# Patient Record
Sex: Female | Born: 2000 | Race: White | Hispanic: No | Marital: Single | Smoking: Never smoker
Health system: Southern US, Community
[De-identification: ages and names within clinical notes are randomized; demographics above are authoritative.]

## PROBLEM LIST (undated history)

## (undated) DIAGNOSIS — F39 Unspecified mood [affective] disorder: Secondary | ICD-10-CM

## (undated) DIAGNOSIS — R011 Cardiac murmur, unspecified: Secondary | ICD-10-CM

## (undated) DIAGNOSIS — L309 Dermatitis, unspecified: Secondary | ICD-10-CM

## (undated) DIAGNOSIS — F840 Autistic disorder: Secondary | ICD-10-CM

## (undated) DIAGNOSIS — F32A Depression, unspecified: Secondary | ICD-10-CM

## (undated) DIAGNOSIS — Z872 Personal history of diseases of the skin and subcutaneous tissue: Secondary | ICD-10-CM

## (undated) DIAGNOSIS — F913 Oppositional defiant disorder: Secondary | ICD-10-CM

## (undated) DIAGNOSIS — J45909 Unspecified asthma, uncomplicated: Secondary | ICD-10-CM

## (undated) DIAGNOSIS — F329 Major depressive disorder, single episode, unspecified: Secondary | ICD-10-CM

## (undated) DIAGNOSIS — F909 Attention-deficit hyperactivity disorder, unspecified type: Secondary | ICD-10-CM

## (undated) HISTORY — PX: TYMPANOSTOMY TUBE PLACEMENT: SHX32

## (undated) HISTORY — PX: ADENOIDECTOMY: SUR15

---
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
## 2018-04-05 ENCOUNTER — Encounter (HOSPITAL_COMMUNITY): Payer: Self-pay

## 2018-04-05 ENCOUNTER — Other Ambulatory Visit: Payer: Self-pay

## 2018-04-05 ENCOUNTER — Emergency Department (HOSPITAL_COMMUNITY)
Admission: EM | Admit: 2018-04-05 | Discharge: 2018-04-06 | Disposition: A | Discharge status: Home / Self Care | Payer: Medicaid Other | Attending: Emergency Medicine | Admitting: Emergency Medicine

## 2018-04-05 DIAGNOSIS — Z79899 Other long term (current) drug therapy: Secondary | ICD-10-CM | POA: Insufficient documentation

## 2018-04-05 DIAGNOSIS — R1031 Right lower quadrant pain: Secondary | ICD-10-CM | POA: Diagnosis present

## 2018-04-05 DIAGNOSIS — R110 Nausea: Secondary | ICD-10-CM | POA: Diagnosis not present

## 2018-04-05 DIAGNOSIS — R1084 Generalized abdominal pain: Secondary | ICD-10-CM

## 2018-04-05 DIAGNOSIS — F840 Autistic disorder: Secondary | ICD-10-CM | POA: Diagnosis not present

## 2018-04-05 DIAGNOSIS — J45909 Unspecified asthma, uncomplicated: Secondary | ICD-10-CM | POA: Diagnosis not present

## 2018-04-05 DIAGNOSIS — R197 Diarrhea, unspecified: Secondary | ICD-10-CM | POA: Diagnosis not present

## 2018-04-05 LAB — CBC
HEMATOCRIT: 37.6 % (ref 36.0–46.0)
Hemoglobin: 12.3 g/dL (ref 12.0–15.0)
MCH: 29.4 pg (ref 26.0–34.0)
MCHC: 32.7 g/dL (ref 30.0–36.0)
MCV: 89.7 fL (ref 78.0–100.0)
Platelets: 341 10*3/uL (ref 150–400)
RBC: 4.19 MIL/uL (ref 3.87–5.11)
RDW: 12.8 % (ref 11.5–15.5)
WBC: 13.6 10*3/uL — AB (ref 4.0–10.5)

## 2018-04-05 LAB — URINALYSIS, ROUTINE W REFLEX MICROSCOPIC
BILIRUBIN URINE: NEGATIVE
GLUCOSE, UA: NEGATIVE mg/dL
KETONES UR: NEGATIVE mg/dL
NITRITE: NEGATIVE
PH: 6 (ref 5.0–8.0)
Protein, ur: NEGATIVE mg/dL
Specific Gravity, Urine: 1.005 (ref 1.005–1.030)

## 2018-04-05 LAB — I-STAT CG4 LACTIC ACID, ED: Lactic Acid, Venous: 2.1 mmol/L (ref 0.5–1.9)

## 2018-04-05 LAB — I-STAT BETA HCG BLOOD, ED (MC, WL, AP ONLY): I-stat hCG, quantitative: <5 m[IU]/mL (ref <5)

## 2018-04-05 NOTE — ED Triage Notes (Signed)
Pt endorses abd pain x 3 days with radiation to the back, also complains of migraine. Denies n/v/d.

## 2018-04-06 ENCOUNTER — Emergency Department (HOSPITAL_COMMUNITY): Payer: Medicaid Other

## 2018-04-06 LAB — COMPREHENSIVE METABOLIC PANEL
ALT: 16 U/L (ref 0–44)
AST: 20 U/L (ref 15–41)
Albumin: 3.8 g/dL (ref 3.5–5.0)
Alkaline Phosphatase: 88 U/L (ref 38–126)
Anion gap: 6 (ref 5–15)
BILIRUBIN TOTAL: 0.3 mg/dL (ref 0.3–1.2)
BUN: 7 mg/dL (ref 6–20)
CALCIUM: 9.6 mg/dL (ref 8.9–10.3)
CO2: 23 mmol/L (ref 22–32)
Chloride: 110 mmol/L (ref 98–111)
Creatinine, Ser: 0.79 mg/dL (ref 0.44–1.00)
GFR calc Af Amer: >60 mL/min (ref >60)
Glucose, Bld: 123 mg/dL — ABNORMAL HIGH (ref 70–99)
POTASSIUM: 4.1 mmol/L (ref 3.5–5.1)
Sodium: 139 mmol/L (ref 135–145)
TOTAL PROTEIN: 6.7 g/dL (ref 6.5–8.1)

## 2018-04-06 LAB — GC/CHLAMYDIA PROBE AMP (~~LOC~~) NOT AT ARMC
Chlamydia: NEGATIVE
Neisseria Gonorrhea: NEGATIVE

## 2018-04-06 LAB — I-STAT CG4 LACTIC ACID, ED: Lactic Acid, Venous: 1.02 mmol/L (ref 0.5–1.9)

## 2018-04-06 LAB — WET PREP, GENITAL
SPERM: NONE SEEN
TRICH WET PREP: NONE SEEN
YEAST WET PREP: NONE SEEN

## 2018-04-06 LAB — LIPASE, BLOOD: Lipase: 46 U/L (ref 11–51)

## 2018-04-06 LAB — CBG MONITORING, ED: Glucose-Capillary: 126 mg/dL — ABNORMAL HIGH (ref 70–99)

## 2018-04-06 MED ORDER — IOHEXOL 300 MG/ML  SOLN
100.0000 mL | Freq: Once | INTRAMUSCULAR | Status: AC | PRN
  Administered 2018-04-06: 100 mL via INTRAVENOUS

## 2018-04-06 MED ORDER — DICYCLOMINE HCL 20 MG PO TABS: 20.0000 mg | ORAL_TABLET | Freq: Two times a day (BID) | ORAL | 0 refills | Status: DC | PRN

## 2018-04-06 MED ORDER — DICYCLOMINE HCL 10 MG/ML IM SOLN
20.0000 mg | Freq: Once | INTRAMUSCULAR | Status: AC
  Administered 2018-04-06: 20 mg via INTRAMUSCULAR
  Filled 2018-04-06: qty 2

## 2018-04-06 MED ORDER — SODIUM CHLORIDE 0.9 % IV BOLUS
1000.0000 mL | Freq: Once | INTRAVENOUS | Status: AC
  Administered 2018-04-06: 1000 mL via INTRAVENOUS

## 2018-04-06 NOTE — Discharge Instructions (Signed)
Your work-up in the emergency department today was reassuring.  We advised that you continue your daily prescribed medications.  You may take Bentyl as needed for abdominal pain or cramping.  Follow-up with your primary care doctor regarding your visit today.  You may return for new or concerning symptoms.

## 2018-04-06 NOTE — ED Provider Notes (Signed)
[facility] Provider Note   CSN: [id number] Arrival date & time: 04/05/18  2221     History   Chief Complaint Chief Complaint  Patient presents with  . Abdominal Pain    HPI Amanda [name] is a 17 y.o. female.   17 year old female presents to the emergency department for evaluation of abdominal pain.  She reports 3 days of abdominal pain which has been primarily below her umbilicus.  It will intermittently present in her right upper quadrant as well.  She believes her pain is slightly worse with eating.  During triage she reports radiation of her pain to the back, but denies any radiation during my assessment.  She has taken Tylenol for symptoms with little improvement.  Symptoms associated with mild nausea as well as slightly looser, more watery stool.  No fevers, vomiting, dysuria, hematuria, vaginal bleeding, vaginal discharge.  No history of abdominal surgeries.     Past Medical History:  Diagnosis Date  . Asthma   . Attention deficit hyperactivity disorder   . Depression   . Eczema   . Mood disorder (HCC)   . Oppositional defiant disorder     Patient Active Problem List   Diagnosis Date Noted  . DMDD (disruptive mood dysregulation disorder) (HCC) 01/14/2014  . PTSD (post-traumatic stress disorder) 01/14/2014  . Deliberate self-cutting 01/14/2014  . Autism spectrum 01/14/2014  . ADHD (attention deficit hyperactivity disorder) 01/14/2014  . Aggression 01/14/2014  . Suicidal ideation 01/14/2014  . ODD (oppositional defiant disorder) 01/13/2014  . Oppositional defiant disorder 11/04/2013    Past Surgical History:  Procedure Laterality Date  . ADENOIDECTOMY    . TYMPANOSTOMY TUBE PLACEMENT       OB History   None      Home Medications    Prior to Admission medications   Medication Sig Start Date End Date Taking? Authorizing Provider  Cholecalciferol (VITAMIN D3) 50000 units CAPS Take 50,000 Units by mouth once a  week.    [provider]  ciprofloxacin (CIPRO) 500 MG tablet Take 500 mg by mouth 2 (two) times daily.    [provider]  citalopram (CELEXA) 20 MG tablet Take 1 tablet (20 mg total) by mouth daily. Patient taking differently: Take 30 mg by mouth daily.  01/16/14   Winson, Louie BunKim B, NP  cloZAPine (CLOZARIL) 100 MG tablet Take 200 mg by mouth daily.    [provider]  clozapine (CLOZARIL) 50 MG tablet Take 50 mg by mouth 2 (two) times daily.    [provider]  desmopressin (DDAVP) 0.1 MG tablet Take 0.1 mg by mouth daily.    [provider]  dexmethylphenidate (FOCALIN XR) 10 MG 24 hr capsule Take 10 mg by mouth 2 (two) times daily.     [provider]  dicyclomine (BENTYL) 20 MG tablet Take 1 tablet (20 mg total) by mouth every 12 (twelve) hours as needed (abdominal pain/cramping). 04/06/18   Antony MaduraHumes, Carolann Brazell, PA-C  DiphenhydrAMINE HCl 50 MG/50ML LIQD Take by mouth.    [provider]  doxycycline (MONODOX) 100 MG capsule Take 1 capsule (100 mg total) by mouth 2 (two) times daily. For 7 days. 05/17/16   Warnell ForesterGrimes, Akilah, MD  hydrocortisone cream 1 % Apply 1 application topically 2 (two) times daily.    [provider]  hydrOXYzine (VISTARIL) 50 MG capsule Take 50 mg by mouth 3 (three) times daily as needed.    [provider]  ibuprofen (ADVIL,MOTRIN) 200 MG tablet Take  400 mg by mouth every 6 (six) hours as needed for mild pain. Reported on 12/05/2015 01/16/14   Jolene Schimke, NP  levothyroxine (SYNTHROID, LEVOTHROID) 50 MCG tablet Take 50 mcg by mouth daily before breakfast.    [provider]  lithium carbonate (ESKALITH) 450 MG CR tablet Take 1 tablet (450 mg total) by mouth 2 (two) times daily. Patient taking differently: Take 300 mg by mouth 3 (three) times daily.  01/16/14   Winson, Louie Bun, NP  loratadine (CLARITIN) 10 MG tablet Take 10 mg by mouth daily.    [provider]  montelukast (SINGULAIR) 10 MG  tablet Take 10 mg by mouth at bedtime.    [provider]  permethrin (ELIMITE) 5 % cream Apply 1 application topically once.    [provider]  traZODone (DESYREL) 100 MG tablet Take 100 mg by mouth at bedtime.    [provider]  triamcinolone cream (KENALOG) 0.5 % Apply 1 application topically 3 (three) times daily.    [provider]  valACYclovir (VALTREX) 500 MG tablet Take 500 mg by mouth daily.    [provider]    Family History History reviewed. No pertinent family history.  Social History Social History   Tobacco Use  . Smoking status: Never Smoker  . Smokeless tobacco: Never Used  Substance Use Topics  . Alcohol use: No  . Drug use: No     Allergies   Poison ivy extract [poison ivy extract] and Sulfa antibiotics   Review of Systems Review of Systems Ten systems reviewed and are negative for acute change, except as noted in the HPI.    Physical Exam Updated Vital Signs BP 111/66   Pulse 99   Temp 98.5 F (36.9 C) (Oral)   Resp 16   Ht 5\' 6"  (1.676 m)   Wt 99.8 kg (220 lb)   LMP 03/15/2018 (Exact Date)   SpO2 96%   BMI 35.51 kg/m   Physical Exam  Constitutional: She is oriented to person, place, and time. She appears well-developed and well-nourished. No distress.  Nontoxic appearing and in no distress  HENT:  Head: Normocephalic and atraumatic.  Eyes: Conjunctivae and EOM are normal. No scleral icterus.  Neck: Normal range of motion.  Cardiovascular: Normal rate, regular rhythm and intact distal pulses.  Not tachycardic as noted in triage  Pulmonary/Chest: Effort normal. No stridor. No respiratory distress. She has no wheezes.  Respirations even and unlabored.  Lungs clear.  Abdominal:  Soft, obese abdomen.  There is fairly generalized tenderness, subjectively worse in the right lower quadrant.  No peritoneal signs or involuntary guarding.  No palpable masses.  Musculoskeletal: Normal range of motion.    Neurological: She is alert and oriented to person, place, and time. She exhibits normal muscle tone. Coordination normal.  Skin: Skin is warm and dry. No rash noted. She is not diaphoretic. No erythema. No pallor.  Psychiatric: She has a normal mood and affect. Her behavior is normal.  Nursing note and vitals reviewed.    ED Treatments / Results  Labs (all labs ordered are listed, but only abnormal results are displayed) Labs Reviewed  WET PREP, GENITAL - Abnormal; Notable for the following components:      Result Value   Clue Cells Wet Prep HPF POC PRESENT (*)    WBC, Wet Prep HPF POC MANY (*)    All other components within normal limits  COMPREHENSIVE METABOLIC PANEL - Abnormal; Notable for the following components:  Glucose, Bld 123 (*)    All other components within normal limits  CBC - Abnormal; Notable for the following components:   WBC 13.6 (*)    All other components within normal limits  URINALYSIS, ROUTINE W REFLEX MICROSCOPIC - Abnormal; Notable for the following components:   Hgb urine dipstick SMALL (*)    Leukocytes, UA SMALL (*)    Bacteria, UA FEW (*)    All other components within normal limits  I-STAT CG4 LACTIC ACID, ED - Abnormal; Notable for the following components:   Lactic Acid, Venous 2.10 (*)    All other components within normal limits  CBG MONITORING, ED - Abnormal; Notable for the following components:   Glucose-Capillary 126 (*)    All other components within normal limits  LIPASE, BLOOD  I-STAT BETA HCG BLOOD, ED ([facility], [facility], [facility] ONLY)  I-STAT CG4 LACTIC ACID, ED  GC/CHLAMYDIA PROBE AMP ([place]) NOT AT [facility]    EKG None  Radiology Ct Abdomen Pelvis W Contrast  Result Date: 04/06/2018 CLINICAL DATA:  17 year old female with acute abdominal pain. EXAM: CT ABDOMEN AND PELVIS WITH CONTRAST TECHNIQUE: Multidetector CT imaging of the abdomen and pelvis was performed using the standard protocol following bolus administration of intravenous  contrast. CONTRAST:  OMNIPAQUE IOHEXOL 300 MG/ML  SOLN COMPARISON:  None. FINDINGS: Evaluation of this exam is limited due to respiratory motion artifact. Lower chest: The visualized lung bases are clear. No intra-abdominal free air or free fluid. Hepatobiliary: No focal liver abnormality is seen. No gallstones, gallbladder wall thickening, or biliary dilatation. Pancreas: Unremarkable. No pancreatic ductal dilatation or surrounding inflammatory changes. Spleen: Normal in size without focal abnormality. Adrenals/Urinary Tract: Adrenal glands are unremarkable. Kidneys are normal, without renal calculi, focal lesion, or hydronephrosis. Bladder is unremarkable. Stomach/Bowel: There is no bowel obstruction or active inflammation. High attenuating content within the appendix may represent oral contrast from prior CT or appendicoliths. The appendix otherwise appears unremarkable without inflammatory changes. Vascular/Lymphatic: No significant vascular findings are present. No enlarged abdominal or pelvic lymph nodes. Reproductive: The uterus and ovaries are grossly unremarkable. Other: None Musculoskeletal: No acute or significant osseous findings. IMPRESSION: No acute intra-abdominal or pelvic pathology. No bowel obstruction or active inflammation. High attenuating content within the appendix likely residual oral contrast from prior studies or appendicoliths. No CT evidence of acute appendicitis. Electronically Signed   By: Elgie Collard M.D.   On: 04/06/2018 02:39    Procedures Procedures (including critical care time)  Medications Ordered in ED Medications  sodium chloride 0.9 % bolus 1,000 mL (0 mLs Intravenous Stopped 04/06/18 0240)  dicyclomine (BENTYL) injection 20 mg (20 mg Intramuscular Given 04/06/18 0139)  iohexol (OMNIPAQUE) 300 MG/ML solution 100 mL (100 mLs Intravenous Contrast Given 04/06/18 0200)     Initial Impression / Assessment and Plan / ED Course  I have reviewed the triage vital  signs and the nursing notes.  Pertinent labs & imaging results that were available during my care of the patient were reviewed by me and considered in my medical decision making (see chart for details).     17 year old female presents to the emergency department for evaluation of abdominal pain.  Pain has been present for the past 3 days.  She has not had any vomiting or diarrhea.  Denies urinary symptoms as well as vaginal complaints.  Laboratory work-up today is reassuring.  Patient noted to have a mild, nonspecific leukocytosis.  No electrolyte derangements.  Liver and kidney function preserved.  CT scan  was obtained which shows no acute process in the abdomen or pelvis.  Question viral etiology versus constipation.  Will discharge with course of Bentyl for management of pain and cramping.  Encourage primary care follow-up to ensure resolution of symptoms.  Return precautions discussed and provided. Patient discharged in stable condition with no unaddressed concerns.   Final Clinical Impressions(s) / ED Diagnoses   Final diagnoses:  Generalized abdominal pain    ED Discharge Orders        Ordered    dicyclomine (BENTYL) 20 MG tablet  Every 12 hours PRN     04/06/18 0522       Antony Madura, PA-C 04/08/18 0506    Nira Conn, MD 04/10/18 1049

## 2018-04-06 NOTE — ED Notes (Signed)
Patient transported to X-ray 

## 2018-04-17 ENCOUNTER — Emergency Department (HOSPITAL_COMMUNITY): Payer: Medicaid Other

## 2018-04-17 ENCOUNTER — Emergency Department (HOSPITAL_COMMUNITY)
Admission: EM | Admit: 2018-04-17 | Discharge: 2018-04-17 | Disposition: A | Discharge status: Home / Self Care | Payer: Medicaid Other | Attending: Emergency Medicine | Admitting: Emergency Medicine

## 2018-04-17 ENCOUNTER — Encounter (HOSPITAL_COMMUNITY): Payer: Self-pay | Admitting: Emergency Medicine

## 2018-04-17 DIAGNOSIS — J45909 Unspecified asthma, uncomplicated: Secondary | ICD-10-CM | POA: Diagnosis not present

## 2018-04-17 DIAGNOSIS — Z79899 Other long term (current) drug therapy: Secondary | ICD-10-CM | POA: Insufficient documentation

## 2018-04-17 DIAGNOSIS — R0602 Shortness of breath: Secondary | ICD-10-CM

## 2018-04-17 LAB — D-DIMER, QUANTITATIVE: D-Dimer, Quant: 0.27 ug/mL-FEU (ref 0.00–0.50)

## 2018-04-17 LAB — I-STAT CHEM 8, ED
BUN: 10 mg/dL (ref 6–20)
CALCIUM ION: 1.25 mmol/L (ref 1.15–1.40)
CREATININE: 0.8 mg/dL (ref 0.44–1.00)
Chloride: 109 mmol/L (ref 98–111)
Glucose, Bld: 132 mg/dL — ABNORMAL HIGH (ref 70–99)
HCT: 35 % — ABNORMAL LOW (ref 36.0–46.0)
Hemoglobin: 11.9 g/dL — ABNORMAL LOW (ref 12.0–15.0)
Potassium: 4 mmol/L (ref 3.5–5.1)
Sodium: 140 mmol/L (ref 135–145)
TCO2: 20 mmol/L — ABNORMAL LOW (ref 22–32)

## 2018-04-17 LAB — I-STAT BETA HCG BLOOD, ED (MC, WL, AP ONLY): I-stat hCG, quantitative: <5 m[IU]/mL (ref <5)

## 2018-04-17 NOTE — ED Provider Notes (Signed)
Patient placed in Quick Look pathway, seen and evaluated   Chief Complaint: Anxiety, shortness of breath  HPI: Patient reports she went on a walk today and became short of breath, then discovered that her inhaler was empty and began to panic.  EMS was called because the patient was hyperventilating and started to feel dizzy, she did the breathing exercises her doctor is taught her and now reports she is starting to feel better.  No chest pain.  History of similar events in the past.  Currently resides in a group home and is getting ready to be released out on her own.  ROS: + Shortness of breath, anxiety, dizziness. -Chest pain, syncope  Physical Exam:   Gen: No distress, although appears anxious on exam   Neuro: Awake and Alert  Skin: Warm    Focused Exam: Lungs clear to auscultation bilaterally, heart regular rate and rhythm   Initiation of care has begun. The patient has been counseled on the process, plan, and necessity for staying for the completion/evaluation, and the remainder of the medical screening examination    Legrand RamsFord, Kelsey N, PA-C 04/17/18 2017    Azalia Bilisampos, Kevin, MD 04/17/18 [phone]

## 2018-04-17 NOTE — ED Provider Notes (Signed)
[facility] Provider Note   CSN: [id number] Arrival date & time: 04/17/18  1952     History   Chief Complaint Chief Complaint  Patient presents with  . Shortness of Breath  . Anxiety    HPI Amanda [name] is a 17 y.o. female who presents the emergency department chief complaint of shortness of breath.Past medical history of autism spectrum disorder, PTSD, oppositional defiant disorder, and mood regulation disorder.  Patient states that she was out walking today when she started feeling short of breath.  She reached in her approach to use her inhaler but noticed that it was empty.  She says that she became more and more short of breath and started feeling very dizzy like she was going to pass out.  She went to a [facility] and called an ambulance who brought her here to the emergency department.  HPI  Past Medical History:  Diagnosis Date  . Asthma   . Attention deficit hyperactivity disorder   . Depression   . Eczema   . Mood disorder (HCC)   . Oppositional defiant disorder     Patient Active Problem List   Diagnosis Date Noted  . DMDD (disruptive mood dysregulation disorder) (HCC) 01/14/2014  . PTSD (post-traumatic stress disorder) 01/14/2014  . Deliberate self-cutting 01/14/2014  . Autism spectrum 01/14/2014  . ADHD (attention deficit hyperactivity disorder) 01/14/2014  . Aggression 01/14/2014  . Suicidal ideation 01/14/2014  . ODD (oppositional defiant disorder) 01/13/2014  . Oppositional defiant disorder 11/04/2013    Past Surgical History:  Procedure Laterality Date  . ADENOIDECTOMY    . TYMPANOSTOMY TUBE PLACEMENT       OB History   None      Home Medications    Prior to Admission medications   Medication Sig Start Date End Date Taking? Authorizing Provider  citalopram (CELEXA) 20 MG tablet Take 1 tablet (20 mg total) by mouth daily. Patient taking differently: Take 30 mg by mouth daily.  01/16/14  Yes Winson,  Louie BunKim B, NP  cloZAPine (CLOZARIL) 100 MG tablet Take 200 mg by mouth daily.   Yes [provider]  clozapine (CLOZARIL) 50 MG tablet Take 50 mg by mouth 2 (two) times daily.   Yes [provider]  dicyclomine (BENTYL) 20 MG tablet Take 1 tablet (20 mg total) by mouth every 12 (twelve) hours as needed (abdominal pain/cramping). 04/06/18  Yes Antony MaduraHumes, Kelly, PA-C  levothyroxine (SYNTHROID, LEVOTHROID) 50 MCG tablet Take 50 mcg by mouth daily before breakfast.   Yes [provider]  lithium carbonate (ESKALITH) 450 MG CR tablet Take 1 tablet (450 mg total) by mouth 2 (two) times daily. Patient taking differently: Take 300 mg by mouth 3 (three) times daily.  01/16/14  Yes Winson, Louie BunKim B, NP  valACYclovir (VALTREX) 500 MG tablet Take 500 mg by mouth daily.   Yes [provider]  ADDERALL XR 20 MG 24 hr capsule Take 20 mg by mouth daily after lunch. 02/22/18   [provider]  Cholecalciferol (VITAMIN D3) 50000 units CAPS Take 50,000 Units by mouth once a week.    [provider]  ciprofloxacin (CIPRO) 500 MG tablet Take 500 mg by mouth 2 (two) times daily.    [provider]  desmopressin (DDAVP) 0.1 MG tablet Take 0.1 mg by mouth daily.    [provider]  dexmethylphenidate (FOCALIN XR) 10 MG 24 hr capsule Take 10 mg by mouth 2 (two) times daily.     [provider]  DiphenhydrAMINE HCl 50 MG/50ML LIQD Take by mouth.    [provider]  doxycycline (MONODOX) 100 MG capsule Take 1 capsule (100 mg total) by mouth 2 (two) times daily. For 7 days. 05/17/16   Warnell Forester, MD  folic acid (FOLVITE) 1 MG tablet Take 1 mg by mouth daily. 02/15/18   [provider]  hydrocortisone cream 1 % Apply 1 application topically 2 (two) times daily.    [provider]  hydrOXYzine (VISTARIL) 50 MG capsule Take 50 mg by mouth 3 (three) times daily as needed.    [provider]  ibuprofen (ADVIL,MOTRIN) 200 MG  tablet Take 400 mg by mouth every 6 (six) hours as needed for mild pain. Reported on 12/05/2015 01/16/14   Jolene Schimke, NP  loratadine (CLARITIN) 10 MG tablet Take 10 mg by mouth daily.    [provider]  montelukast (SINGULAIR) 10 MG tablet Take 10 mg by mouth at bedtime.    [provider]  permethrin (ELIMITE) 5 % cream Apply 1 application topically once.    [provider]  traZODone (DESYREL) 100 MG tablet Take 100 mg by mouth at bedtime.    [provider]  triamcinolone cream (KENALOG) 0.5 % Apply 1 application topically 3 (three) times daily.    [provider]  VYVANSE 70 MG capsule Take 70 mg by mouth every morning. 02/16/18   [provider]    Family History No family history on file.  Social History Social History   Tobacco Use  . Smoking status: Never Smoker  . Smokeless tobacco: Never Used  Substance Use Topics  . Alcohol use: No  . Drug use: No     Allergies   Poison ivy extract [poison ivy extract] and Sulfa antibiotics   Review of Systems Review of Systems Ten systems reviewed and are negative for acute change, except as noted in the HPI.    Physical Exam Updated Vital Signs BP 129/84 (BP Location: Left Arm)   Pulse (!) 109   Temp 98.5 F (36.9 C) (Oral)   Resp 18   SpO2 99%   Physical Exam  Physical Exam  Nursing note and vitals reviewed. Constitutional: She is oriented to person, place, and time. She appears well-developed and well-nourished. No distress.  HENT:  Head: Normocephalic and atraumatic.  Eyes: Conjunctivae normal and EOM are normal. Pupils are equal, round, and reactive to light. No scleral icterus.  Neck: Normal range of motion.  Cardiovascular: Normal rate, regular rhythm and normal heart sounds.  Exam reveals no gallop and no friction rub.   No murmur heard. Pulmonary/Chest: Effort normal and breath sounds normal. No respiratory distress.  Abdominal: Soft. Bowel sounds are  normal. She exhibits no distension and no mass. There is no tenderness. There is no guarding.  Neurological: She is alert and oriented to person, place, and time.  Skin: Skin is warm and dry. She is not diaphoretic.    ED Treatments / Results  Labs (all labs ordered are listed, but only abnormal results are displayed) Labs Reviewed  I-STAT CHEM 8, ED - Abnormal; Notable for the following components:      Result Value   Glucose, Bld 132 (*)    TCO2 20 (*)    Hemoglobin 11.9 (*)    HCT 35.0 (*)    All other components within normal limits  D-DIMER, QUANTITATIVE (NOT AT [facility])  I-STAT BETA HCG BLOOD, ED ([facility], [facility], [facility] ONLY)  EKG None ECG interpretation   Date: 04/18/2018  Rate: 108  Rhythm: Sinus tachycardia  QRS Axis: normal  Intervals: normal  ST/T Wave abnormalities: normal  Conduction Disutrbances: none  Narrative Interpretation:   Old EKG Reviewed: No significant changes noted    Radiology No results found.  Procedures Procedures (including critical care time)  Medications Ordered in ED Medications - No data to display   Initial Impression / Assessment and Plan / ED Course  I have reviewed the triage vital signs and the nursing notes.  Pertinent labs & imaging results that were available during my care of the patient were reviewed by me and considered in my medical decision making (see chart for details).  Clinical Course as of Apr 18 130  Tue Apr 18, 2018  0127 ED EKG [AH]    Clinical Course User Index [AH] Arthor Captain, PA-C    Patient presents the emergency department chief complaint of shortness of breath.  . Differential diagnosis includes ACS, pneumothorax, pleural effusion, pneumonia, cough, asthma.,  Pulmonary embolus.  The patient's d-dimer is negative so she is extremely low risk for PE.  I have personally reviewed her chest x-ray which shows no acute abnormalities EKG sinus tachycardia .  Glucose is slightly elevated which I think is likely  acute phase reaction.  I doubt any emergent cause for the patient's symptoms today.  I have spoken with her mother who will come and pick her up.  She appears appropriate for discharge at this time Final Clinical Impressions(s) / ED Diagnoses   Final diagnoses:  Shortness of breath    ED Discharge Orders    None       Arthor Captain, PA-C 04/18/18 0132    Raeford Razor, MD 04/21/18 [phone]

## 2018-04-17 NOTE — ED Notes (Signed)
Pt was given coke and water per request

## 2018-04-17 NOTE — ED Triage Notes (Signed)
Pt went for walk, became SOB, discovered her inhaler was empty and "began to panic".  Per EMS pt was hyperventilating however is now feel dizzy.  Please note mother is legal guardian [phone].

## 2018-04-17 NOTE — Discharge Instructions (Addendum)
Get help right away if: °Your shortness of breath gets worse. °You have shortness of breath when you are resting. °You feel light-headed or you faint. °You have a cough that is not controlled with medicines. °You cough up blood. °You have pain with breathing. °You have pain in your chest, arms, shoulders, or abdomen. °You have a fever. °You cannot walk up stairs or exercise the way that you normally do. °

## 2018-05-30 ENCOUNTER — Emergency Department (HOSPITAL_COMMUNITY)
Admission: EM | Admit: 2018-05-30 | Discharge: 2018-05-30 | Disposition: A | Discharge status: Home / Self Care | Payer: Medicaid Other | Attending: Emergency Medicine | Admitting: Emergency Medicine

## 2018-05-30 ENCOUNTER — Other Ambulatory Visit: Payer: Self-pay

## 2018-05-30 ENCOUNTER — Encounter (HOSPITAL_COMMUNITY): Payer: Self-pay | Admitting: *Deleted

## 2018-05-30 DIAGNOSIS — H9201 Otalgia, right ear: Secondary | ICD-10-CM

## 2018-05-30 DIAGNOSIS — F840 Autistic disorder: Secondary | ICD-10-CM | POA: Diagnosis not present

## 2018-05-30 DIAGNOSIS — Z3202 Encounter for pregnancy test, result negative: Secondary | ICD-10-CM | POA: Diagnosis not present

## 2018-05-30 DIAGNOSIS — Z79899 Other long term (current) drug therapy: Secondary | ICD-10-CM | POA: Diagnosis not present

## 2018-05-30 LAB — POC URINE PREG, ED: Preg Test, Ur: NEGATIVE

## 2018-05-30 MED ORDER — CIPROFLOXACIN-DEXAMETHASONE 0.3-0.1 % OT SUSP
4.0000 [drp] | Freq: Once | OTIC | Status: AC
Start: 2018-05-30 — End: 2018-05-30
  Administered 2018-05-30: 4 [drp] via OTIC
  Filled 2018-05-30: qty 7.5

## 2018-05-30 NOTE — Discharge Instructions (Signed)
Please read and follow all provided instructions.  You were seen here today for abrasion to the right ear canal.   Your pregnancy test was negative.   Home Instructions: Please do not insert anything into your ear that is smaller than your elbow.  This includes QTips, keys, hair pins, etc.    Use 4 drops in the right ear twice daily for 7 days.   Follow-up instructions: Please follow-up with your primary care provider for further evaluation of symptoms and treatment.   Additional Information:  Your vital signs today were: BP 112/79 (BP Location: Left Arm)    Pulse 88    Temp 98.6 F (37 C) (Oral)    Resp 18    SpO2 99%  If your blood pressure (BP) was elevated above 135/85 this visit, please have this repeated by your doctor within one month. ---------------

## 2018-05-30 NOTE — ED Provider Notes (Signed)
[facility] EMERGENCY DEPARTMENT Provider Note   CSN: [id number] Arrival date & time: 05/30/18  1546     History   Chief Complaint Chief Complaint  Patient presents with  . Otalgia    HPI Amanda [name] is a 17 y.o. female with a history of autism, ADHD, who presents emergency department today for right ear pain and bleeding.  Patient reports that yesterday she had fullness in her right ear so she plugged her nose and blew his car she could.  Her ear felt full afterwards so she went home and used Q-tips vigorously and noticed some bleeding at the end of the Q-tip.  She reports she has had intermittent bleeding from the ear since that time and this morning when she awoke she had dried blood on the outside of her ear.  The patient denies any headache, nasal congestion, sore throat, neck stiffness, visual changes, recent swimming  She states that she would also like to have a pregnancy test done.  She states she has been with her boyfriend for approximately 1 year and although she uses to control she does not use condoms and wants to make sure she is not pregnant.  She states that she missed her period this month but cannot afford pregnancy test at [facility] and wanted to be tested here.  She denies any abdominal pain or vaginal bleeding.  HPI  Past Medical History:  Diagnosis Date  . Asthma   . Attention deficit hyperactivity disorder   . Depression   . Eczema   . Mood disorder (HCC)   . Oppositional defiant disorder     Patient Active Problem List   Diagnosis Date Noted  . DMDD (disruptive mood dysregulation disorder) (HCC) 01/14/2014  . PTSD (post-traumatic stress disorder) 01/14/2014  . Deliberate self-cutting 01/14/2014  . Autism spectrum 01/14/2014  . ADHD (attention deficit hyperactivity disorder) 01/14/2014  . Aggression 01/14/2014  . Suicidal ideation 01/14/2014  . ODD (oppositional defiant disorder) 01/13/2014  . Oppositional defiant disorder  11/04/2013    Past Surgical History:  Procedure Laterality Date  . ADENOIDECTOMY    . TYMPANOSTOMY TUBE PLACEMENT       OB History   None      Home Medications    Prior to Admission medications   Medication Sig Start Date End Date Taking? Authorizing Provider  ADDERALL XR 20 MG 24 hr capsule Take 20 mg by mouth daily after lunch. 02/22/18   [provider]  albuterol (PROVENTIL HFA;VENTOLIN HFA) 108 (90 Base) MCG/ACT inhaler Inhale 1-2 puffs into the lungs every 6 (six) hours as needed for wheezing or shortness of breath.    [provider]  citalopram (CELEXA) 20 MG tablet Take 1 tablet (20 mg total) by mouth daily. Patient taking differently: Take 30 mg by mouth daily.  01/16/14   Winson, Louie Bun, NP  cloZAPine (CLOZARIL) 100 MG tablet Take 200 mg by mouth every evening.     [provider]  clozapine (CLOZARIL) 50 MG tablet Take 50 mg by mouth every morning.     [provider]  dicyclomine (BENTYL) 20 MG tablet Take 1 tablet (20 mg total) by mouth every 12 (twelve) hours as needed (abdominal pain/cramping). 04/06/18   Antony Madura, PA-C  etonogestrel (NEXPLANON) 68 MG IMPL implant 1 each by Subdermal route once.    [provider]  folic acid (FOLVITE) 1 MG tablet Take 1 mg by mouth daily. 02/15/18   [provider]  levothyroxine (SYNTHROID,  LEVOTHROID) 50 MCG tablet Take 50 mcg by mouth daily before breakfast.    [provider]  lithium carbonate (ESKALITH) 450 MG CR tablet Take 1 tablet (450 mg total) by mouth 2 (two) times daily. Patient taking differently: Take 300 mg by mouth 3 (three) times daily.  01/16/14   Winson, Louie Bun, NP  montelukast (SINGULAIR) 10 MG tablet Take 10 mg by mouth at bedtime.    [provider]  valACYclovir (VALTREX) 500 MG tablet Take 500 mg by mouth daily.    [provider]  VYVANSE 70 MG capsule Take 70 mg by mouth every morning. 02/16/18   [provider]     Family History History reviewed. No pertinent family history.  Social History Social History   Tobacco Use  . Smoking status: Never Smoker  . Smokeless tobacco: Never Used  Substance Use Topics  . Alcohol use: No  . Drug use: No     Allergies   Poison ivy extract [poison ivy extract] and Sulfa antibiotics   Review of Systems Review of Systems  All other systems reviewed and are negative.    Physical Exam Updated Vital Signs BP 112/79 (BP Location: Left Arm)   Pulse 88   Temp 98.6 F (37 C) (Oral)   Resp 18   SpO2 99%   Physical Exam  Constitutional: She appears well-developed and well-nourished.  HENT:  Head: Normocephalic and atraumatic.  Right Ear: Hearing, tympanic membrane and external ear normal. No hemotympanum.  Left Ear: Hearing, tympanic membrane, external ear and ear canal normal. No hemotympanum.  Nose: Nose normal.  Mouth/Throat: Uvula is midline, oropharynx is clear and moist and mucous membranes are normal. No tonsillar exudate.  Dried blood and abrasion to the right ear canal. TM intact with cone of light and bony landmarks visualized.  No mastoid tenderness, erythema, edema.  No obliteration of postauricular crease.     Eyes: Pupils are equal, round, and reactive to light. Right eye exhibits no discharge. Left eye exhibits no discharge. No scleral icterus.  Neck: Trachea normal. Neck supple. No spinous process tenderness present. No neck rigidity. Normal range of motion present.  No nuchal rigidity or meningismus  Cardiovascular: Normal rate, regular rhythm and intact distal pulses.  No murmur heard. Pulses:      Radial pulses are 2+ on the right side, and 2+ on the left side.       Dorsalis pedis pulses are 2+ on the right side, and 2+ on the left side.       Posterior tibial pulses are 2+ on the right side, and 2+ on the left side.  Pulmonary/Chest: Effort normal and breath sounds normal. She exhibits no tenderness.  Abdominal: Soft. Bowel  sounds are normal. She exhibits no distension. There is no tenderness. There is no rigidity, no rebound, no guarding and no CVA tenderness.  Musculoskeletal: She exhibits no edema.  Lymphadenopathy:    She has no cervical adenopathy.  Neurological: She is alert.  Skin: Skin is warm and dry. No rash noted. She is not diaphoretic.  Psychiatric: She has a normal mood and affect.  Nursing note and vitals reviewed.    ED Treatments / Results  Labs (all labs ordered are listed, but only abnormal results are displayed) Labs Reviewed - No data to display  EKG None  Radiology No results found.  Procedures Procedures (including critical care time)  Medications Ordered in ED Medications  ciprofloxacin-dexamethasone (CIPRODEX) 0.3-0.1 % OTIC (EAR) suspension 4 drop (4  drops Right EAR Given 05/30/18 1905)     Initial Impression / Assessment and Plan / ED Course  I have reviewed the triage vital signs and the nursing notes.  Pertinent labs & imaging results that were available during my care of the patient were reviewed by me and considered in my medical decision making (see chart for details).     17 y.o. female with abrasion to the right ear canal 2/2 q-tip use.  No evidence barotrauma, TM rupture, or active infection.  No concern for meningitis or mastoiditis.  Will prescribe patient CiproDex drops prophylactically.  Recommended avoiding Q-tips and water.  She is to follow-up with her PCP.  Patient also requested pregnancy test which is negative.  She denies any abdominal pain, vaginal bleeding or vaginal discharge.  No further work-up indicated.  Return precautions discussed.  Patient appears safe discharge.  Final Clinical Impressions(s) / ED Diagnoses   Final diagnoses:  Otalgia of right ear  Negative pregnancy test    ED Discharge Orders    None       Princella Pellegrini 05/30/18 1927    Sabas Sous, MD 05/31/18 [phone]

## 2018-05-30 NOTE — ED Notes (Signed)
Pt states "I'm not married but I do want to be pregnant, please let me know as soon as you find out!"

## 2018-05-30 NOTE — ED Triage Notes (Signed)
Per EMS, pt from home. Pt reports rt ear bleeding that started yesterday. Bleeding controlled at this time.

## 2018-06-05 ENCOUNTER — Emergency Department (HOSPITAL_COMMUNITY)
Admission: EM | Admit: 2018-06-05 | Discharge: 2018-06-06 | Disposition: A | Discharge status: Home / Self Care | Payer: Medicaid Other | Attending: Emergency Medicine | Admitting: Emergency Medicine

## 2018-06-05 ENCOUNTER — Other Ambulatory Visit: Payer: Self-pay

## 2018-06-05 DIAGNOSIS — J45909 Unspecified asthma, uncomplicated: Secondary | ICD-10-CM | POA: Insufficient documentation

## 2018-06-05 DIAGNOSIS — R1030 Lower abdominal pain, unspecified: Secondary | ICD-10-CM | POA: Diagnosis not present

## 2018-06-05 DIAGNOSIS — R1013 Epigastric pain: Secondary | ICD-10-CM | POA: Diagnosis present

## 2018-06-05 DIAGNOSIS — N939 Abnormal uterine and vaginal bleeding, unspecified: Secondary | ICD-10-CM | POA: Diagnosis not present

## 2018-06-05 DIAGNOSIS — R1033 Periumbilical pain: Secondary | ICD-10-CM | POA: Diagnosis not present

## 2018-06-05 DIAGNOSIS — R110 Nausea: Secondary | ICD-10-CM | POA: Insufficient documentation

## 2018-06-05 DIAGNOSIS — F909 Attention-deficit hyperactivity disorder, unspecified type: Secondary | ICD-10-CM | POA: Diagnosis not present

## 2018-06-05 DIAGNOSIS — R109 Unspecified abdominal pain: Secondary | ICD-10-CM | POA: Diagnosis not present

## 2018-06-05 DIAGNOSIS — Z79899 Other long term (current) drug therapy: Secondary | ICD-10-CM | POA: Insufficient documentation

## 2018-06-05 LAB — COMPREHENSIVE METABOLIC PANEL
ALBUMIN: 4.3 g/dL (ref 3.5–5.0)
ALT: 16 U/L (ref 0–44)
AST: 23 U/L (ref 15–41)
Alkaline Phosphatase: 74 U/L (ref 38–126)
Anion gap: 8 (ref 5–15)
BILIRUBIN TOTAL: 0.9 mg/dL (ref 0.3–1.2)
BUN: 8 mg/dL (ref 6–20)
CO2: 23 mmol/L (ref 22–32)
CREATININE: 0.93 mg/dL (ref 0.44–1.00)
Calcium: 9.7 mg/dL (ref 8.9–10.3)
Chloride: 108 mmol/L (ref 98–111)
GFR calc Af Amer: >60 mL/min (ref >60)
GFR calc non Af Amer: >60 mL/min (ref >60)
GLUCOSE: 99 mg/dL (ref 70–99)
POTASSIUM: 4.2 mmol/L (ref 3.5–5.1)
Sodium: 139 mmol/L (ref 135–145)
TOTAL PROTEIN: 7.2 g/dL (ref 6.5–8.1)

## 2018-06-05 LAB — CBC
HEMATOCRIT: 38.4 % (ref 36.0–46.0)
Hemoglobin: 12.6 g/dL (ref 12.0–15.0)
MCH: 28.4 pg (ref 26.0–34.0)
MCHC: 32.8 g/dL (ref 30.0–36.0)
MCV: 86.5 fL (ref 78.0–100.0)
PLATELETS: 303 10*3/uL (ref 150–400)
RBC: 4.44 MIL/uL (ref 3.87–5.11)
RDW: 12.8 % (ref 11.5–15.5)
WBC: 13.4 10*3/uL — ABNORMAL HIGH (ref 4.0–10.5)

## 2018-06-05 LAB — I-STAT BETA HCG BLOOD, ED (MC, WL, AP ONLY): I-stat hCG, quantitative: <5 m[IU]/mL (ref <5)

## 2018-06-05 LAB — LIPASE, BLOOD: Lipase: 27 U/L (ref 11–51)

## 2018-06-05 NOTE — ED Triage Notes (Signed)
Pt reports upper abdominal pain x 3 weeks. Pt reports nausea and vomiting. Pt was dx with ulcer, given medication with no relief. Pt reports worsening x 3 days. Pt reports dark vaginal bleeding since Friday. Pt reports she is not currently pregnant and has the implant for birth control.

## 2018-06-06 ENCOUNTER — Emergency Department (HOSPITAL_COMMUNITY): Payer: Medicaid Other

## 2018-06-06 LAB — URINALYSIS, ROUTINE W REFLEX MICROSCOPIC
Bacteria, UA: NONE SEEN
Bilirubin Urine: NEGATIVE
Glucose, UA: NEGATIVE mg/dL
HGB URINE DIPSTICK: NEGATIVE
Ketones, ur: 20 mg/dL — AB
Leukocytes, UA: NEGATIVE
NITRITE: NEGATIVE
PH: 6 (ref 5.0–8.0)
Protein, ur: 30 mg/dL — AB
Specific Gravity, Urine: 1.029 (ref 1.005–1.030)

## 2018-06-06 LAB — LITHIUM LEVEL: Lithium Lvl: 0.31 mmol/L — ABNORMAL LOW (ref 0.60–1.20)

## 2018-06-06 NOTE — Discharge Instructions (Signed)
Labs and imaging today were normal. Continue all your regular medications. Return here for new concerns.

## 2018-06-06 NOTE — ED Notes (Signed)
Pt taken to u/s & xray via stretcher

## 2018-06-06 NOTE — ED Notes (Signed)
Pt in BR for urine sample. Pt ask to change into gown.

## 2018-06-06 NOTE — ED Provider Notes (Signed)
[facility] EMERGENCY DEPARTMENT Provider Note   CSN: [id number] Arrival date & time: 06/05/18  2050     History   Chief Complaint Chief Complaint  Patient presents with  . Abdominal Pain    HPI Amanda [name] is a 17 y.o. female.  The history is provided by the patient and medical records.     17 year old female with history of asthma, ADD, depression, mood disorder, autism, presenting to the ED with abdominal pain.  Reports she has been having issues for about 3 months now.  Has been seen here previously for same.  Pain localized around her epigastrium and peri-umbilical region without radiation.  States one time she was told she might been constipated, another time she was told she may have a gastric ulcer.  She does take omeprazole daily.  States she is been having trouble eating but was able to eat some fruit snacks and a ginger ale in the waiting room.  She states she does have some issues with bowel movements, has been difficult to go and stool has been hard.  She denies any vomiting but has had some nausea.  No fever or chills.  Additionally, patient reports some lower abdominal pain which she states is different from the "other abdominal pain".  States she started having some irregular vaginal bleeding a few days ago.  States blood is very dark in color and looks different than a normal menstrual cycle.  She denies vaginal discharge.  She denies new sexual partner or concern for STD.  She does have nexplanon implant in left arm.  She reports normal period 3 weeks ago.  No significant GYN history.  Past Medical History:  Diagnosis Date  . Asthma   . Attention deficit hyperactivity disorder   . Depression   . Eczema   . Mood disorder (HCC)   . Oppositional defiant disorder     Patient Active Problem List   Diagnosis Date Noted  . DMDD (disruptive mood dysregulation disorder) (HCC) 01/14/2014  . PTSD (post-traumatic stress disorder) 01/14/2014  .  Deliberate self-cutting 01/14/2014  . Autism spectrum 01/14/2014  . ADHD (attention deficit hyperactivity disorder) 01/14/2014  . Aggression 01/14/2014  . Suicidal ideation 01/14/2014  . ODD (oppositional defiant disorder) 01/13/2014  . Oppositional defiant disorder 11/04/2013    Past Surgical History:  Procedure Laterality Date  . ADENOIDECTOMY    . TYMPANOSTOMY TUBE PLACEMENT       OB History   None      Home Medications    Prior to Admission medications   Medication Sig Start Date End Date Taking? Authorizing Provider  ADDERALL XR 20 MG 24 hr capsule Take 20 mg by mouth daily after lunch. 02/22/18   [provider]  albuterol (PROVENTIL HFA;VENTOLIN HFA) 108 (90 Base) MCG/ACT inhaler Inhale 1-2 puffs into the lungs every 6 (six) hours as needed for wheezing or shortness of breath.    [provider]  citalopram (CELEXA) 20 MG tablet Take 1 tablet (20 mg total) by mouth daily. Patient taking differently: Take 30 mg by mouth daily.  01/16/14   Winson, Louie Bun, NP  cloZAPine (CLOZARIL) 100 MG tablet Take 200 mg by mouth every evening.     [provider]  clozapine (CLOZARIL) 50 MG tablet Take 50 mg by mouth every morning.     [provider]  dicyclomine (BENTYL) 20 MG tablet Take 1 tablet (20 mg total) by mouth every 12 (twelve) hours as needed (abdominal pain/cramping). 04/06/18  Antony Madura, PA-C  etonogestrel (NEXPLANON) 68 MG IMPL implant 1 each by Subdermal route once.    [provider]  folic acid (FOLVITE) 1 MG tablet Take 1 mg by mouth daily. 02/15/18   [provider]  levothyroxine (SYNTHROID, LEVOTHROID) 50 MCG tablet Take 50 mcg by mouth daily before breakfast.    [provider]  lithium carbonate (ESKALITH) 450 MG CR tablet Take 1 tablet (450 mg total) by mouth 2 (two) times daily. Patient taking differently: Take 300 mg by mouth 3 (three) times daily.  01/16/14   Winson, Louie Bun, NP  montelukast (SINGULAIR)  10 MG tablet Take 10 mg by mouth at bedtime.    [provider]  valACYclovir (VALTREX) 500 MG tablet Take 500 mg by mouth daily.    [provider]  VYVANSE 70 MG capsule Take 70 mg by mouth every morning. 02/16/18   [provider]    Family History No family history on file.  Social History Social History   Tobacco Use  . Smoking status: Never Smoker  . Smokeless tobacco: Never Used  Substance Use Topics  . Alcohol use: No  . Drug use: No     Allergies   Poison ivy extract [poison ivy extract] and Sulfa antibiotics   Review of Systems Review of Systems  Gastrointestinal: Positive for abdominal pain and nausea.  All other systems reviewed and are negative.    Physical Exam Updated Vital Signs BP 115/72 (BP Location: Right Arm)   Pulse 76   Temp 98.4 F (36.9 C) (Oral)   Resp 20   SpO2 99%   Physical Exam  Constitutional: She is oriented to person, place, and time. She appears well-developed and well-nourished.  Laughing and joking during exam, NAD  HENT:  Head: Normocephalic and atraumatic.  Mouth/Throat: Oropharynx is clear and moist.  Eyes: Pupils are equal, round, and reactive to light. Conjunctivae and EOM are normal.  Neck: Normal range of motion.  Cardiovascular: Normal rate, regular rhythm and normal heart sounds.  Pulmonary/Chest: Effort normal and breath sounds normal.  Abdominal: Soft. Bowel sounds are normal. There is no tenderness. There is no rigidity and no guarding.  Soft, benign, no focal tenderness appreciated  Musculoskeletal: Normal range of motion.  Neurological: She is alert and oriented to person, place, and time.  Skin: Skin is warm and dry.  Psychiatric:  Hyperactive, very fidgety during exam  Nursing note and vitals reviewed.    ED Treatments / Results  Labs (all labs ordered are listed, but only abnormal results are displayed) Labs Reviewed  CBC - Abnormal; Notable for the following components:       Result Value   WBC 13.4 (*)    All other components within normal limits  URINALYSIS, ROUTINE W REFLEX MICROSCOPIC - Abnormal; Notable for the following components:   APPearance HAZY (*)    Ketones, ur 20 (*)    Protein, ur 30 (*)    All other components within normal limits  LITHIUM LEVEL - Abnormal; Notable for the following components:   Lithium Lvl 0.31 (*)    All other components within normal limits  LIPASE, BLOOD  COMPREHENSIVE METABOLIC PANEL  I-STAT BETA HCG BLOOD, ED ([facility], [facility], [facility] ONLY)    EKG None  Radiology US Pelvis Transvanginal Non-ob (tv Only)  Result Date: 06/06/2018 CLINICAL DATA:  Initial evaluation for irregular vaginal bleeding. EXAM: TRANSABDOMINAL ULTRASOUND OF PELVIS DOPPLER ULTRASOUND OF OVARIES TECHNIQUE: Transabdominal ultrasound examination of the pelvis was performed  including evaluation of the uterus, ovaries, adnexal regions, and pelvic cul-de-sac. Color and duplex Doppler ultrasound was utilized to evaluate blood flow to the ovaries. COMPARISON:  None. FINDINGS: Uterus Measurements: 6.9 x 3.0 x 4.8 cm. No fibroids or other mass visualized. Endometrium Thickness: 4.9 mm.  No focal abnormality visualized. Right ovary Measurements: 3.5 x 2.6 x 3.4 cm. Normal appearance/no adnexal mass. Left ovary Measurements: 3.3 x 2.4 x 2.7 cm. Normal appearance/no adnexal mass. Pulsed Doppler evaluation demonstrates normal low-resistance arterial and venous waveforms in both ovaries. Other: No free fluid within the pelvis. IMPRESSION: Normal pelvic ultrasound.  No acute abnormality identified. Electronically Signed   By: Rise Mu M.D.   On: 06/06/2018 05:10   US Pelvis Complete  Result Date: 06/06/2018 CLINICAL DATA:  Initial evaluation for irregular vaginal bleeding. EXAM: TRANSABDOMINAL ULTRASOUND OF PELVIS DOPPLER ULTRASOUND OF OVARIES TECHNIQUE: Transabdominal ultrasound examination of the pelvis was performed including evaluation of the uterus, ovaries,  adnexal regions, and pelvic cul-de-sac. Color and duplex Doppler ultrasound was utilized to evaluate blood flow to the ovaries. COMPARISON:  None. FINDINGS: Uterus Measurements: 6.9 x 3.0 x 4.8 cm. No fibroids or other mass visualized. Endometrium Thickness: 4.9 mm.  No focal abnormality visualized. Right ovary Measurements: 3.5 x 2.6 x 3.4 cm. Normal appearance/no adnexal mass. Left ovary Measurements: 3.3 x 2.4 x 2.7 cm. Normal appearance/no adnexal mass. Pulsed Doppler evaluation demonstrates normal low-resistance arterial and venous waveforms in both ovaries. Other: No free fluid within the pelvis. IMPRESSION: Normal pelvic ultrasound.  No acute abnormality identified. Electronically Signed   By: Rise Mu M.D.   On: 06/06/2018 05:10   US Pelvic Doppler (torsion R/o Or Mass Arterial Flow)  Result Date: 06/06/2018 CLINICAL DATA:  Initial evaluation for irregular vaginal bleeding. EXAM: TRANSABDOMINAL ULTRASOUND OF PELVIS DOPPLER ULTRASOUND OF OVARIES TECHNIQUE: Transabdominal ultrasound examination of the pelvis was performed including evaluation of the uterus, ovaries, adnexal regions, and pelvic cul-de-sac. Color and duplex Doppler ultrasound was utilized to evaluate blood flow to the ovaries. COMPARISON:  None. FINDINGS: Uterus Measurements: 6.9 x 3.0 x 4.8 cm. No fibroids or other mass visualized. Endometrium Thickness: 4.9 mm.  No focal abnormality visualized. Right ovary Measurements: 3.5 x 2.6 x 3.4 cm. Normal appearance/no adnexal mass. Left ovary Measurements: 3.3 x 2.4 x 2.7 cm. Normal appearance/no adnexal mass. Pulsed Doppler evaluation demonstrates normal low-resistance arterial and venous waveforms in both ovaries. Other: No free fluid within the pelvis. IMPRESSION: Normal pelvic ultrasound.  No acute abnormality identified. Electronically Signed   By: Rise Mu M.D.   On: 06/06/2018 05:10   Dg Abd 2 Views  Result Date: 06/06/2018 CLINICAL DATA:  Mid abdominal pain for  1 month. EXAM: ABDOMEN - 2 VIEW COMPARISON:  CT abdomen and pelvis 04/06/2018 FINDINGS: Gas and stool throughout the colon. No small or large bowel distention. No free intra-abdominal air. No abnormal air-fluid levels. No radiopaque stones. Visualized bones and soft tissue contours appear intact. IMPRESSION: Normal nonobstructive bowel gas pattern. Electronically Signed   By: Burman Nieves M.D.   On: 06/06/2018 05:26    Procedures Procedures (including critical care time)  Medications Ordered in ED Medications - No data to display   Initial Impression / Assessment and Plan / ED Course  I have reviewed the triage vital signs and the nursing notes.  Pertinent labs & imaging results that were available during my care of the patient were reviewed by me and considered in my medical decision making (see chart for details).  17 year old female presenting to the ED with abdominal pain.  After history and physical, reports "2 different pains".  States he has been having abdominal pain for at least 3 months or so.  Has been seen here previously and had a CT scan that was negative.  She reports trouble eating, although was able to eat fruit snacks and ginger ale in waiting area.  Also reports trouble with BM's.  New vaginal bleeding over the past few days, darker in color than normal cycle.  Denies vaginal discharge or new sexual partner.  Labs thus far reassuring, mild leukocytosis but very similar to prior values.  Symptoms are somewhat atypical.    Pelvic US without acute findings, patient refused transvaginal portion.  X-ray abdomen without acute findings.  On re-check, patient sleeping in bed comfortably.  Once awoken, we reviewed her results.  She does not appear to be in any distress.  I have low suspicion for acute/surgical pathology.  Feel she is stable for discharge with OP follow-up.  She will return here for any new/acute changes.  Final Clinical Impressions(s) / ED Diagnoses   Final  diagnoses:  Abdominal pain    ED Discharge Orders    None       Garlon HatchetSanders, Dessa Ledee M, PA-C 06/06/18 [id number]    Zadie RhineWickline, Donald, MD 06/06/18 2310

## 2018-06-06 NOTE — ED Notes (Signed)
Pt refused transvaginal, PA notified

## 2018-08-20 ENCOUNTER — Encounter (HOSPITAL_COMMUNITY): Payer: Self-pay

## 2018-08-20 ENCOUNTER — Emergency Department (HOSPITAL_COMMUNITY)
Admission: EM | Admit: 2018-08-20 | Discharge: 2018-08-21 | Disposition: A | Discharge status: Home / Self Care | Payer: Medicaid Other | Attending: Emergency Medicine | Admitting: Emergency Medicine

## 2018-08-20 ENCOUNTER — Other Ambulatory Visit: Payer: Self-pay

## 2018-08-20 DIAGNOSIS — K5900 Constipation, unspecified: Secondary | ICD-10-CM | POA: Diagnosis not present

## 2018-08-20 DIAGNOSIS — J45909 Unspecified asthma, uncomplicated: Secondary | ICD-10-CM | POA: Insufficient documentation

## 2018-08-20 DIAGNOSIS — F840 Autistic disorder: Secondary | ICD-10-CM | POA: Insufficient documentation

## 2018-08-20 DIAGNOSIS — R1030 Lower abdominal pain, unspecified: Secondary | ICD-10-CM | POA: Diagnosis present

## 2018-08-20 DIAGNOSIS — Z79899 Other long term (current) drug therapy: Secondary | ICD-10-CM | POA: Diagnosis not present

## 2018-08-20 LAB — COMPREHENSIVE METABOLIC PANEL
ALT: 18 U/L (ref 0–44)
ANION GAP: 6 (ref 5–15)
AST: 19 U/L (ref 15–41)
Albumin: 4.3 g/dL (ref 3.5–5.0)
Alkaline Phosphatase: 79 U/L (ref 38–126)
BUN: 9 mg/dL (ref 6–20)
CO2: 24 mmol/L (ref 22–32)
Calcium: 9.3 mg/dL (ref 8.9–10.3)
Chloride: 106 mmol/L (ref 98–111)
Creatinine, Ser: 0.93 mg/dL (ref 0.44–1.00)
GFR calc Af Amer: >60 mL/min (ref >60)
GFR calc non Af Amer: >60 mL/min (ref >60)
Glucose, Bld: 102 mg/dL — ABNORMAL HIGH (ref 70–99)
POTASSIUM: 3.9 mmol/L (ref 3.5–5.1)
SODIUM: 136 mmol/L (ref 135–145)
Total Bilirubin: 0.9 mg/dL (ref 0.3–1.2)
Total Protein: 7.2 g/dL (ref 6.5–8.1)

## 2018-08-20 LAB — CBC
HEMATOCRIT: 39.7 % (ref 36.0–46.0)
HEMOGLOBIN: 12.8 g/dL (ref 12.0–15.0)
MCH: 28.3 pg (ref 26.0–34.0)
MCHC: 32.2 g/dL (ref 30.0–36.0)
MCV: 87.8 fL (ref 80.0–100.0)
Platelets: 302 10*3/uL (ref 150–400)
RBC: 4.52 MIL/uL (ref 3.87–5.11)
RDW: 13.2 % (ref 11.5–15.5)
WBC: 15.3 10*3/uL — ABNORMAL HIGH (ref 4.0–10.5)
nRBC: 0 % (ref 0.0–0.2)

## 2018-08-20 LAB — I-STAT BETA HCG BLOOD, ED (MC, WL, AP ONLY): I-stat hCG, quantitative: <5 m[IU]/mL (ref <5)

## 2018-08-20 LAB — LIPASE, BLOOD: Lipase: 29 U/L (ref 11–51)

## 2018-08-20 MED ORDER — SODIUM CHLORIDE 0.9 % IV BOLUS
1000.0000 mL | Freq: Once | INTRAVENOUS | Status: AC
  Administered 2018-08-21: 1000 mL via INTRAVENOUS

## 2018-08-20 MED ORDER — MORPHINE SULFATE (PF) 4 MG/ML IV SOLN
4.0000 mg | Freq: Once | INTRAVENOUS | Status: AC
  Administered 2018-08-21: 4 mg via INTRAVENOUS
  Filled 2018-08-20: qty 1

## 2018-08-20 MED ORDER — ONDANSETRON HCL 4 MG/2ML IJ SOLN
4.0000 mg | Freq: Once | INTRAMUSCULAR | Status: AC
  Administered 2018-08-21: 4 mg via INTRAVENOUS
  Filled 2018-08-20: qty 2

## 2018-08-20 NOTE — ED Provider Notes (Signed)
[facility]-EMERGENCY DEPT Provider Note   CSN: [id number] Arrival date & time: 08/20/18  2043     History   Chief Complaint Chief Complaint  Patient presents with  . Abdominal Pain    HPI Amanda [name] is a 17 y.o. female.  The history is provided by the patient and medical records. No language interpreter was used.  Abdominal Pain       17 year old female with history of oppositional defiant disorder, depression, autism, brought here via EMS from home for evaluation of lower abdominal pain.  Patient report for the past 3 days she has had recurrent lower abdominal pain.  She described pain as a stabbing sharp sensation primarily in her left lower quadrant, radiates to her back.  Pain is also associated with constipation as well as nausea.  She endorsed some mild chills.  She reports movement and sometimes eating aggravate her symptoms.  Pain is moderate in severity, and taking home pain medication did not provide adequate relief.  She is tried having a bowel movement today without success.  States that she has not had a bowel movement in about 3 days.  Patient denies chest pain, shortness of breath, productive cough, dysuria, hematuria, vaginal bleeding or vaginal discharge.  She is sexually active.  Past Medical History:  Diagnosis Date  . Asthma   . Attention deficit hyperactivity disorder   . Depression   . Eczema   . Mood disorder (HCC)   . Oppositional defiant disorder     Patient Active Problem List   Diagnosis Date Noted  . DMDD (disruptive mood dysregulation disorder) (HCC) 01/14/2014  . PTSD (post-traumatic stress disorder) 01/14/2014  . Deliberate self-cutting 01/14/2014  . Autism spectrum 01/14/2014  . ADHD (attention deficit hyperactivity disorder) 01/14/2014  . Aggression 01/14/2014  . Suicidal ideation 01/14/2014  . ODD (oppositional defiant disorder) 01/13/2014  . Oppositional defiant disorder 11/04/2013    Past Surgical History:    Procedure Laterality Date  . ADENOIDECTOMY    . TYMPANOSTOMY TUBE PLACEMENT       OB History   None      Home Medications    Prior to Admission medications   Medication Sig Start Date End Date Taking? Authorizing Provider  ADDERALL XR 20 MG 24 hr capsule Take 20 mg by mouth daily after lunch. 02/22/18   [provider]  albuterol (PROVENTIL HFA;VENTOLIN HFA) 108 (90 Base) MCG/ACT inhaler Inhale 1-2 puffs into the lungs every 6 (six) hours as needed for wheezing or shortness of breath.    [provider]  citalopram (CELEXA) 20 MG tablet Take 1 tablet (20 mg total) by mouth daily. Patient taking differently: Take 30 mg by mouth daily.  01/16/14   Winson, Louie Bun, NP  cloZAPine (CLOZARIL) 100 MG tablet Take 200 mg by mouth every evening.     [provider]  clozapine (CLOZARIL) 50 MG tablet Take 50 mg by mouth every morning.     [provider]  dicyclomine (BENTYL) 20 MG tablet Take 1 tablet (20 mg total) by mouth every 12 (twelve) hours as needed (abdominal pain/cramping). 04/06/18   Antony Madura, PA-C  etonogestrel (NEXPLANON) 68 MG IMPL implant 1 each by Subdermal route once.    [provider]  folic acid (FOLVITE) 1 MG tablet Take 1 mg by mouth daily. 02/15/18   [provider]  levothyroxine (SYNTHROID, LEVOTHROID) 50 MCG tablet Take 50 mcg by mouth daily before breakfast.    [provider]  lithium  carbonate (ESKALITH) 450 MG CR tablet Take 1 tablet (450 mg total) by mouth 2 (two) times daily. Patient taking differently: Take 300 mg by mouth 3 (three) times daily.  01/16/14   Winson, Louie Bun, NP  montelukast (SINGULAIR) 10 MG tablet Take 10 mg by mouth at bedtime.    [provider]  valACYclovir (VALTREX) 500 MG tablet Take 500 mg by mouth daily.    [provider]  VYVANSE 70 MG capsule Take 70 mg by mouth every morning. 02/16/18   [provider]    Family History History reviewed. No  pertinent family history.  Social History Social History   Tobacco Use  . Smoking status: Never Smoker  . Smokeless tobacco: Never Used  Substance Use Topics  . Alcohol use: No  . Drug use: No     Allergies   Poison ivy extract [poison ivy extract] and Sulfa antibiotics   Review of Systems Review of Systems  Gastrointestinal: Positive for abdominal pain.  All other systems reviewed and are negative.    Physical Exam Updated Vital Signs BP 124/86 (BP Location: Left Arm)   Pulse (!) 118   Temp 98.6 F (37 C) (Oral)   Resp 16   SpO2 100%   Physical Exam  Constitutional: She appears well-developed and well-nourished. No distress.  Obese female nontoxic in appearance  HENT:  Head: Atraumatic.  Eyes: Conjunctivae are normal.  Neck: Neck supple.  Cardiovascular:  Tachycardia without murmur rubs or gallop  Pulmonary/Chest: Effort normal and breath sounds normal.  Abdominal: Soft. Bowel sounds are normal. She exhibits no distension. There is tenderness (Tenderness to suprapubic and left lower quadrant on palpation without guarding or rebound tenderness.).  Genitourinary:  Genitourinary Comments: Chaperone present during exam.  Normal external genitalia.  No inguinal lymphadenopathy or inguinal hernia noted.  Mild discomfort with speculum insertion.  Mild tenderness to left adnexal region.  No cervical motion tenderness.  No obvious mass.  Cervical os is closed.  Mild discharge noted in vaginal vault.  On rectal examination, no obvious mass, no external hemorrhoid, no obstructive stool, normal rectal tone.  Normal stool color on glove.  Neurological: She is alert.  Skin: No rash noted.  Psychiatric: She has a normal mood and affect.  Nursing note and vitals reviewed.    ED Treatments / Results  Labs (all labs ordered are listed, but only abnormal results are displayed) Labs Reviewed  WET PREP, GENITAL - Abnormal; Notable for the following components:      Result  Value   WBC, Wet Prep HPF POC FEW (*)    All other components within normal limits  COMPREHENSIVE METABOLIC PANEL - Abnormal; Notable for the following components:   Glucose, Bld 102 (*)    All other components within normal limits  CBC - Abnormal; Notable for the following components:   WBC 15.3 (*)    All other components within normal limits  URINALYSIS, ROUTINE W REFLEX MICROSCOPIC - Abnormal; Notable for the following components:   Specific Gravity, Urine 1.031 (*)    All other components within normal limits  LIPASE, BLOOD  I-STAT BETA HCG BLOOD, ED ([facility], [facility], [facility] ONLY)  GC/CHLAMYDIA PROBE AMP ([place]) NOT AT [facility]    EKG None  Radiology Ct Abdomen Pelvis W Contrast  Result Date: 08/21/2018 CLINICAL DATA:  Abdominal pain, distention EXAM: CT ABDOMEN AND PELVIS WITH CONTRAST TECHNIQUE: Multidetector CT imaging of the abdomen and pelvis was performed using the standard protocol following bolus administration of  intravenous contrast. CONTRAST:  100mL ISOVUE-300 IOPAMIDOL (ISOVUE-300) INJECTION 61% COMPARISON:  04/06/2018 FINDINGS: Lower chest: Lung bases are clear. No effusions. Heart is normal size. Hepatobiliary: No focal hepatic abnormality. Gallbladder unremarkable. Pancreas: No focal abnormality or ductal dilatation. Spleen: No focal abnormality.  Normal size. Adrenals/Urinary Tract: No adrenal abnormality. No focal renal abnormality. No stones or hydronephrosis. Urinary bladder is unremarkable. Stomach/Bowel: Moderate stool throughout the colon. Normal appendix. Stomach, large and small bowel grossly unremarkable. Vascular/Lymphatic: No evidence of aneurysm or adenopathy. Reproductive: Uterus and adnexa unremarkable.  No mass. Other: Small amount of free fluid in the cul-de-sac.  No free air. Musculoskeletal: No acute bony abnormality. IMPRESSION: Moderate stool burden throughout the colon. No acute findings in the abdomen or pelvis. Electronically Signed   By: Charlett NoseKevin  Dover M.D.    On: 08/21/2018 01:15    Procedures Procedures (including critical care time)  Medications Ordered in ED Medications  iopamidol (ISOVUE-300) 61 % injection (has no administration in time range)  sodium chloride (PF) 0.9 % injection (has no administration in time range)  sodium chloride 0.9 % bolus 1,000 mL (1,000 mLs Intravenous New Bag/Given 08/21/18 0014)  morphine 4 MG/ML injection 4 mg (4 mg Intravenous Given 08/21/18 0014)  ondansetron (ZOFRAN) injection 4 mg (4 mg Intravenous Given 08/21/18 0013)  iopamidol (ISOVUE-300) 61 % injection 100 mL (100 mLs Intravenous Contrast Given 08/21/18 0057)     Initial Impression / Assessment and Plan / ED Course  I have reviewed the triage vital signs and the nursing notes.  Pertinent labs & imaging results that were available during my care of the patient were reviewed by me and considered in my medical decision making (see chart for details).     BP 111/75   Pulse (!) 102   Temp 98.6 F (37 C) (Oral)   Resp 17   SpO2 97%    Final Clinical Impressions(s) / ED Diagnoses   Final diagnoses:  Constipation, unspecified constipation type    ED Discharge Orders         Ordered    polyethylene glycol (MIRALAX / GLYCOLAX) packet  Daily     08/21/18 0140    bisacodyl (DULCOLAX) 5 MG EC tablet  2 times daily     08/21/18 0140         11:45 PM Pt here with lower abd pain, most significant to LLQ.  Work up initiated.  Patient have not had a bowel movement in the past few days.  She is also on iron supplementation which increased risks of constipation.  She does not have any significant abdominal surgery in the past.  Labs remarkable for elevated WBC of 15.3.  Pregnancy test is negative.  Normal lipase.  Normal electrolytes panel.  On exam, no impacted stool.  Pelvic semination with mild left adnexal tenderness.  Will obtain abdominal pelvic CT scan for further evaluation.  1:34 AM Wet prep unremarkable, urinalysis without signs of urinary  tract infection.  Abdominal pelvis CT scan shows evidence of moderate amount of stool throughout the colon but no evidence of SBO or other acute finding.  Suspect constipation causing patient's abdominal pain.  Patient discharged home with MiraLAX and Dulcolax.  Return precautions discussed.  1:38 AM Pt has a legal guardian Cranford MonChristy Chavis Johnson [phone].  I have attempted to call but unable to notify as no one pick up the phone.     Fayrene Helperran, Laini Urick, PA-C 08/21/18 0141    Molpus, Jonny RuizJohn, MD 08/21/18 [phone]

## 2018-08-20 NOTE — ED Triage Notes (Addendum)
PT BIB [facility]. Pt reports LLQ abdominal pain x 2 days. LBM 3 days ago. Attempted to use OTC laxatives without relief. VSS. No vomiting.

## 2018-08-20 NOTE — ED Notes (Signed)
ED Provider at bedside. 

## 2018-08-20 NOTE — ED Notes (Signed)
Pt aware urine sample needed 

## 2018-08-21 ENCOUNTER — Emergency Department (HOSPITAL_COMMUNITY): Payer: Medicaid Other

## 2018-08-21 LAB — URINALYSIS, ROUTINE W REFLEX MICROSCOPIC
Bilirubin Urine: NEGATIVE
Glucose, UA: NEGATIVE mg/dL
Hgb urine dipstick: NEGATIVE
KETONES UR: NEGATIVE mg/dL
LEUKOCYTES UA: NEGATIVE
Nitrite: NEGATIVE
PH: 6 (ref 5.0–8.0)
Protein, ur: NEGATIVE mg/dL
SPECIFIC GRAVITY, URINE: 1.031 — AB (ref 1.005–1.030)

## 2018-08-21 LAB — WET PREP, GENITAL
CLUE CELLS WET PREP: NONE SEEN
SPERM: NONE SEEN
Trich, Wet Prep: NONE SEEN
Yeast Wet Prep HPF POC: NONE SEEN

## 2018-08-21 LAB — GC/CHLAMYDIA PROBE AMP (~~LOC~~) NOT AT ARMC
Chlamydia: NEGATIVE
NEISSERIA GONORRHEA: NEGATIVE

## 2018-08-21 MED ORDER — BISACODYL 5 MG PO TBEC: 5.0000 mg | DELAYED_RELEASE_TABLET | Freq: Two times a day (BID) | ORAL | 0 refills | Status: DC

## 2018-08-21 MED ORDER — IOPAMIDOL (ISOVUE-300) INJECTION 61%
100.0000 mL | Freq: Once | INTRAVENOUS | Status: AC | PRN
  Administered 2018-08-21: 100 mL via INTRAVENOUS

## 2018-08-21 MED ORDER — SODIUM CHLORIDE (PF) 0.9 % IJ SOLN
INTRAMUSCULAR | Status: AC
  Filled 2018-08-21: qty 50

## 2018-08-21 MED ORDER — POLYETHYLENE GLYCOL 3350 17 G PO PACK: 17.0000 g | PACK | Freq: Every day | ORAL | 0 refills | Status: DC

## 2018-08-21 MED ORDER — IOPAMIDOL (ISOVUE-300) INJECTION 61%
INTRAVENOUS | Status: AC
  Filled 2018-08-21: qty 100

## 2018-08-21 NOTE — Discharge Instructions (Signed)
Your abdominal pain is due to constipation.  Take Miralax and Dulcolax as prescribed to help with bowel movement.  Eat food high in fiber, drink plenty of water.  Follow up with your doctor for further care.

## 2018-08-21 NOTE — ED Notes (Signed)
Pt to CT

## 2018-10-07 ENCOUNTER — Emergency Department (HOSPITAL_COMMUNITY)
Admission: EM | Admit: 2018-10-07 | Discharge: 2018-10-07 | Disposition: A | Discharge status: Home / Self Care | Payer: Medicaid Other | Attending: Emergency Medicine | Admitting: Emergency Medicine

## 2018-10-07 ENCOUNTER — Encounter (HOSPITAL_COMMUNITY): Payer: Self-pay | Admitting: Emergency Medicine

## 2018-10-07 DIAGNOSIS — J45909 Unspecified asthma, uncomplicated: Secondary | ICD-10-CM | POA: Diagnosis not present

## 2018-10-07 DIAGNOSIS — J069 Acute upper respiratory infection, unspecified: Secondary | ICD-10-CM | POA: Diagnosis not present

## 2018-10-07 DIAGNOSIS — R0981 Nasal congestion: Secondary | ICD-10-CM | POA: Diagnosis present

## 2018-10-07 DIAGNOSIS — B9789 Other viral agents as the cause of diseases classified elsewhere: Secondary | ICD-10-CM

## 2018-10-07 DIAGNOSIS — Z79899 Other long term (current) drug therapy: Secondary | ICD-10-CM | POA: Diagnosis not present

## 2018-10-07 LAB — COMPREHENSIVE METABOLIC PANEL
ALT: 18 U/L (ref 0–44)
ANION GAP: 8 (ref 5–15)
AST: 19 U/L (ref 15–41)
Albumin: 3.8 g/dL (ref 3.5–5.0)
Alkaline Phosphatase: 72 U/L (ref 38–126)
BUN: 7 mg/dL (ref 6–20)
CO2: 23 mmol/L (ref 22–32)
Calcium: 9.2 mg/dL (ref 8.9–10.3)
Chloride: 106 mmol/L (ref 98–111)
Creatinine, Ser: 0.65 mg/dL (ref 0.44–1.00)
GFR calc non Af Amer: >60 mL/min (ref >60)
Glucose, Bld: 101 mg/dL — ABNORMAL HIGH (ref 70–99)
POTASSIUM: 4 mmol/L (ref 3.5–5.1)
Sodium: 137 mmol/L (ref 135–145)
Total Bilirubin: 0.7 mg/dL (ref 0.3–1.2)
Total Protein: 6.7 g/dL (ref 6.5–8.1)

## 2018-10-07 LAB — CBC
HCT: 40 % (ref 36.0–46.0)
Hemoglobin: 12.9 g/dL (ref 12.0–15.0)
MCH: 27.7 pg (ref 26.0–34.0)
MCHC: 32.3 g/dL (ref 30.0–36.0)
MCV: 85.8 fL (ref 80.0–100.0)
Platelets: 273 10*3/uL (ref 150–400)
RBC: 4.66 MIL/uL (ref 3.87–5.11)
RDW: 13.6 % (ref 11.5–15.5)
WBC: 12.2 10*3/uL — ABNORMAL HIGH (ref 4.0–10.5)
nRBC: 0 % (ref 0.0–0.2)

## 2018-10-07 LAB — LIPASE, BLOOD: Lipase: 25 U/L (ref 11–51)

## 2018-10-07 MED ORDER — SODIUM CHLORIDE 0.9% FLUSH: 3.0000 mL | Freq: Once | INTRAVENOUS | Status: DC

## 2018-10-07 NOTE — ED Triage Notes (Signed)
Per EMS, "unwell since Wednesday... nausea/vomiting/coughing/sneezing."  20 G R AC - 4 Zofran.  Was very anxious but has calmed down. 136/90, P 100, R18, 100% RA

## 2018-10-07 NOTE — ED Notes (Signed)
Pt states can't breathe and left inhaler at home. Vss. o2 100% RA. Hr 103

## 2018-10-07 NOTE — Discharge Instructions (Signed)
You may take over-the-counter medicine for symptomatic relief, such as Tylenol, Motrin, TheraFlu, Alka seltzer , black elderberry, etc. Please limit acetaminophen (Tylenol) to 4000 mg for a 24hr period. Please note that other over-the-counter medicine may contain acetaminophen or ibuprofen as a component of their ingredients.

## 2018-10-07 NOTE — ED Provider Notes (Signed)
[facility] EMERGENCY DEPARTMENT Provider Note  CSN: [id number] Arrival date & time: 10/07/18 0021  Chief Complaint(s) Anxiety and "flu like symptoms"  HPI Amanda [name] is a 18 y.o. female   The history is provided by the patient.  URI  Presenting symptoms: congestion, cough, rhinorrhea and sore throat   Presenting symptoms: no fever   Severity:  Moderate Onset quality:  Gradual Duration:  3 days Timing:  Constant Progression:  Worsening Chronicity:  New Relieved by:  Nothing Worsened by:  Nothing Associated symptoms: no myalgias   Risk factors: sick contacts   Risk factors: no diabetes mellitus, no immunosuppression and no recent illness     Past Medical History Past Medical History:  Diagnosis Date  . Asthma   . Attention deficit hyperactivity disorder   . Depression   . Eczema   . Mood disorder (HCC)   . Oppositional defiant disorder    Patient Active Problem List   Diagnosis Date Noted  . DMDD (disruptive mood dysregulation disorder) (HCC) 01/14/2014  . PTSD (post-traumatic stress disorder) 01/14/2014  . Deliberate self-cutting 01/14/2014  . Autism spectrum 01/14/2014  . ADHD (attention deficit hyperactivity disorder) 01/14/2014  . Aggression 01/14/2014  . Suicidal ideation 01/14/2014  . ODD (oppositional defiant disorder) 01/13/2014  . Oppositional defiant disorder 11/04/2013   Home Medication(s) Prior to Admission medications   Medication Sig Start Date End Date Taking? Authorizing Provider  ADDERALL XR 20 MG 24 hr capsule Take 20 mg by mouth daily after lunch. 02/22/18   [provider]  albuterol (PROVENTIL HFA;VENTOLIN HFA) 108 (90 Base) MCG/ACT inhaler Inhale 1-2 puffs into the lungs every 6 (six) hours as needed for wheezing or shortness of breath.    [provider]  bisacodyl (DULCOLAX) 5 MG EC tablet Take 1 tablet (5 mg total) by mouth 2 (two) times daily. 08/21/18   Fayrene Helper, PA-C  citalopram (CELEXA) 20 MG  tablet Take 1 tablet (20 mg total) by mouth daily. Patient taking differently: Take 30 mg by mouth daily.  01/16/14   Winson, Louie Bun, NP  cloZAPine (CLOZARIL) 100 MG tablet Take 200 mg by mouth every evening.     [provider]  clozapine (CLOZARIL) 50 MG tablet Take 50 mg by mouth every morning.     [provider]  dicyclomine (BENTYL) 20 MG tablet Take 1 tablet (20 mg total) by mouth every 12 (twelve) hours as needed (abdominal pain/cramping). 04/06/18   Antony Madura, PA-C  etonogestrel (NEXPLANON) 68 MG IMPL implant 1 each by Subdermal route once.    [provider]  folic acid (FOLVITE) 1 MG tablet Take 1 mg by mouth daily. 02/15/18   [provider]  levothyroxine (SYNTHROID, LEVOTHROID) 50 MCG tablet Take 50 mcg by mouth daily before breakfast.    [provider]  lithium carbonate (ESKALITH) 450 MG CR tablet Take 1 tablet (450 mg total) by mouth 2 (two) times daily. Patient taking differently: Take 300 mg by mouth 3 (three) times daily.  01/16/14   Winson, Louie Bun, NP  montelukast (SINGULAIR) 10 MG tablet Take 10 mg by mouth at bedtime.    [provider]  polyethylene glycol (MIRALAX / GLYCOLAX) packet Take 17 g by mouth daily. 08/21/18   Fayrene Helper, PA-C  valACYclovir (VALTREX) 500 MG tablet Take 500 mg by mouth daily.    [provider]  VYVANSE 70 MG capsule Take 70 mg by mouth every morning. 02/16/18   [provider]  Past Surgical History Past Surgical History:  Procedure Laterality Date  . ADENOIDECTOMY    . TYMPANOSTOMY TUBE PLACEMENT     Family History No family history on file.  Social History Social History   Tobacco Use  . Smoking status: Never Smoker  . Smokeless tobacco: Never Used  Substance Use Topics  . Alcohol use: No  . Drug use: No   Allergies Poison ivy extract  [poison ivy extract] and Sulfa antibiotics  Review of Systems Review of Systems  Constitutional: Negative for fever.  HENT: Positive for congestion, rhinorrhea and sore throat.   Respiratory: Positive for cough.   Musculoskeletal: Negative for myalgias.   All other systems are reviewed and are negative for acute change except as noted in the HPI  Physical Exam Vital Signs  I have reviewed the triage vital signs BP 118/79 (BP Location: Right Arm)   Pulse 91   Temp 98.4 F (36.9 C) (Oral)   Resp 16   SpO2 99%   Physical Exam Vitals signs reviewed.  Constitutional:      General: She is not in acute distress.    Appearance: She is well-developed. She is not diaphoretic.  HENT:     Head: Normocephalic and atraumatic.     Right Ear: A middle ear effusion is present. Tympanic membrane is injected. Tympanic membrane is not erythematous.     Left Ear: A middle ear effusion is present. Tympanic membrane is not injected or erythematous.     Nose: Mucosal edema, congestion and rhinorrhea present.     Mouth/Throat:     Pharynx: Oropharynx is clear. No oropharyngeal exudate or posterior oropharyngeal erythema.     Tonsils: No tonsillar exudate.     Comments: Post nasal drip  Eyes:     General: No scleral icterus.       Right eye: No discharge.        Left eye: No discharge.     Conjunctiva/sclera: Conjunctivae normal.     Pupils: Pupils are equal, round, and reactive to light.  Neck:     Musculoskeletal: Normal range of motion and neck supple.  Cardiovascular:     Rate and Rhythm: Normal rate and regular rhythm.     Heart sounds: No murmur. No friction rub. No gallop.   Pulmonary:     Effort: Pulmonary effort is normal. No respiratory distress.     Breath sounds: Normal breath sounds. No stridor. No rales.  Abdominal:     General: There is no distension.     Palpations: Abdomen is soft.     Tenderness: There is no abdominal tenderness.  Musculoskeletal:        General: No  tenderness.  Skin:    General: Skin is warm and dry.     Findings: No erythema or rash.  Neurological:     Mental Status: She is alert and oriented to person, place, and time.     ED Results and Treatments Labs (all labs ordered are listed, but only abnormal results are displayed) Labs Reviewed  COMPREHENSIVE METABOLIC PANEL - Abnormal; Notable for the following components:      Result Value   Glucose, Bld 101 (*)    All other components within normal limits  CBC - Abnormal; Notable for the following components:   WBC 12.2 (*)    All other components within normal limits  LIPASE, BLOOD  I-STAT BETA HCG BLOOD, ED ([facility], [facility], [facility] ONLY)  EKG  EKG Interpretation  Date/Time:    Ventricular Rate:    PR Interval:    QRS Duration:   QT Interval:    QTC Calculation:   R Axis:     Text Interpretation:        Radiology No results found. Pertinent labs & imaging results that were available during my care of the patient were reviewed by me and considered in my medical decision making (see chart for details).  Medications Ordered in ED Medications  sodium chloride flush (NS) 0.9 % injection 3 mL (has no administration in time range)                                                                                                                                    Procedures Procedures  (including critical care time)  Medical Decision Making / ED Course I have reviewed the nursing notes for this encounter and the patient's prior records (if available in EHR or on provided paperwork).    18 y.o. female presents with cough, rhinorrhea, nasal congestion for 3 days. Adequate oral hydration. Rest of history as above.  Patient appears well. No signs of toxicity, patient is interactive and playful. No hypoxia, tachypnea or other signs of respiratory distress. No sign of  clinical dehydration. Lung exam clear. Rest of exam as above.  Most consistent with viral upper respiratory infection.   No evidence suggestive of pharyngitis, AOM, PNA,   Chest x-ray not indicated at this time.  Discussed symptomatic treatment with the patient and they will follow closely with their PCP.      Final Clinical Impression(s) / ED Diagnoses Final diagnoses:  Viral URI with cough   Disposition: Discharge  Condition: Good  I have discussed the results, Dx and Tx plan with the patient who expressed understanding and agree(s) with the plan. Discharge instructions discussed at great length. The patient was given strict return precautions who verbalized understanding of the instructions. No further questions at time of discharge.    ED Discharge Orders    None       Follow Up: Bernadette Hoit, MD [address], [address] [place] [address] [phone]  Schedule an appointment as soon as possible for a visit  in 5-7 days, If symptoms do not improve or  worsen      This chart was dictated using voice recognition software.  Despite best efforts to proofread,  errors can occur which can change the documentation meaning.   Nira Conn, MD 10/07/18 [phone]

## 2018-12-15 ENCOUNTER — Encounter (HOSPITAL_COMMUNITY): Payer: Self-pay | Admitting: Emergency Medicine

## 2018-12-15 ENCOUNTER — Emergency Department (HOSPITAL_COMMUNITY): Payer: Medicaid Other

## 2018-12-15 ENCOUNTER — Other Ambulatory Visit: Payer: Self-pay

## 2018-12-15 ENCOUNTER — Emergency Department (HOSPITAL_COMMUNITY)
Admission: EM | Admit: 2018-12-15 | Discharge: 2018-12-15 | Disposition: A | Discharge status: Home / Self Care | Payer: Medicaid Other | Attending: Emergency Medicine | Admitting: Emergency Medicine

## 2018-12-15 DIAGNOSIS — J45909 Unspecified asthma, uncomplicated: Secondary | ICD-10-CM | POA: Diagnosis not present

## 2018-12-15 DIAGNOSIS — R1110 Vomiting, unspecified: Secondary | ICD-10-CM | POA: Diagnosis not present

## 2018-12-15 DIAGNOSIS — J029 Acute pharyngitis, unspecified: Secondary | ICD-10-CM | POA: Diagnosis present

## 2018-12-15 DIAGNOSIS — R197 Diarrhea, unspecified: Secondary | ICD-10-CM | POA: Diagnosis not present

## 2018-12-15 DIAGNOSIS — J069 Acute upper respiratory infection, unspecified: Secondary | ICD-10-CM

## 2018-12-15 DIAGNOSIS — Z79899 Other long term (current) drug therapy: Secondary | ICD-10-CM | POA: Diagnosis not present

## 2018-12-15 LAB — COMPREHENSIVE METABOLIC PANEL
ALK PHOS: 69 U/L (ref 38–126)
ALT: 18 U/L (ref 0–44)
AST: 17 U/L (ref 15–41)
Albumin: 3.4 g/dL — ABNORMAL LOW (ref 3.5–5.0)
Anion gap: 9 (ref 5–15)
BUN: 9 mg/dL (ref 6–20)
CALCIUM: 9.4 mg/dL (ref 8.9–10.3)
CO2: 20 mmol/L — ABNORMAL LOW (ref 22–32)
Chloride: 107 mmol/L (ref 98–111)
Creatinine, Ser: 0.81 mg/dL (ref 0.44–1.00)
GFR calc Af Amer: >60 mL/min (ref >60)
GFR calc non Af Amer: >60 mL/min (ref >60)
GLUCOSE: 100 mg/dL — AB (ref 70–99)
Potassium: 3.4 mmol/L — ABNORMAL LOW (ref 3.5–5.1)
Sodium: 136 mmol/L (ref 135–145)
Total Bilirubin: 0.5 mg/dL (ref 0.3–1.2)
Total Protein: 6.7 g/dL (ref 6.5–8.1)

## 2018-12-15 LAB — GROUP A STREP BY PCR: Group A Strep by PCR: NOT DETECTED

## 2018-12-15 LAB — CBC WITH DIFFERENTIAL/PLATELET
ABS IMMATURE GRANULOCYTES: 0.1 10*3/uL — AB (ref 0.00–0.07)
BASOS PCT: 0 %
Basophils Absolute: 0 10*3/uL (ref 0.0–0.1)
Eosinophils Absolute: 0 10*3/uL (ref 0.0–0.5)
Eosinophils Relative: 0 %
HCT: 41 % (ref 36.0–46.0)
Hemoglobin: 13.3 g/dL (ref 12.0–15.0)
Immature Granulocytes: 1 %
Lymphocytes Relative: 25 %
Lymphs Abs: 3.1 10*3/uL (ref 0.7–4.0)
MCH: 27.7 pg (ref 26.0–34.0)
MCHC: 32.4 g/dL (ref 30.0–36.0)
MCV: 85.2 fL (ref 80.0–100.0)
Monocytes Absolute: 1.5 10*3/uL — ABNORMAL HIGH (ref 0.1–1.0)
Monocytes Relative: 12 %
Neutro Abs: 7.8 10*3/uL — ABNORMAL HIGH (ref 1.7–7.7)
Neutrophils Relative %: 62 %
Platelets: 275 10*3/uL (ref 150–400)
RBC: 4.81 MIL/uL (ref 3.87–5.11)
RDW: 13.1 % (ref 11.5–15.5)
WBC: 12.5 10*3/uL — ABNORMAL HIGH (ref 4.0–10.5)
nRBC: 0 % (ref 0.0–0.2)

## 2018-12-15 LAB — URINALYSIS, ROUTINE W REFLEX MICROSCOPIC
Bilirubin Urine: NEGATIVE
Glucose, UA: NEGATIVE mg/dL
KETONES UR: NEGATIVE mg/dL
Nitrite: NEGATIVE
Protein, ur: NEGATIVE mg/dL
Specific Gravity, Urine: 1.025 (ref 1.005–1.030)
pH: 5 (ref 5.0–8.0)

## 2018-12-15 LAB — I-STAT BETA HCG BLOOD, ED (MC, WL, AP ONLY): I-stat hCG, quantitative: <5 m[IU]/mL (ref <5)

## 2018-12-15 LAB — LIPASE, BLOOD: Lipase: 20 U/L (ref 11–51)

## 2018-12-15 MED ORDER — SODIUM CHLORIDE 0.9 % IV BOLUS
1000.0000 mL | Freq: Once | INTRAVENOUS | Status: AC
  Administered 2018-12-15: 1000 mL via INTRAVENOUS

## 2018-12-15 MED ORDER — LORATADINE 10 MG PO TABS: 10.0000 mg | ORAL_TABLET | Freq: Every day | ORAL | 0 refills | Status: DC

## 2018-12-15 MED ORDER — ONDANSETRON HCL 4 MG/2ML IJ SOLN
4.0000 mg | Freq: Once | INTRAMUSCULAR | Status: AC
  Administered 2018-12-15: 4 mg via INTRAVENOUS
  Filled 2018-12-15: qty 2

## 2018-12-15 MED ORDER — ONDANSETRON HCL 4 MG PO TABS: 4.0000 mg | ORAL_TABLET | Freq: Four times a day (QID) | ORAL | 0 refills | Status: DC | PRN

## 2018-12-15 MED ORDER — FLUTICASONE PROPIONATE 50 MCG/ACT NA SUSP: 2.0000 | Freq: Every day | NASAL | 0 refills | Status: DC

## 2018-12-15 NOTE — Discharge Instructions (Addendum)
Person Under Monitoring Name: Amanda [name]  Location: [address] [place] [address]   Infection Prevention Recommendations for Individuals Confirmed to have, or Being Evaluated for, 2019 Novel Coronavirus (COVID-19) Infection Who Receive Care at Home  Individuals who are confirmed to have, or are being evaluated for, COVID-19 should follow the prevention steps below until a healthcare provider or local or state health department says they can return to normal activities.  Stay home except to get medical care You should restrict activities outside your home, except for getting medical care. Do not go to work, school, or public areas, and do not use public transportation or taxis.  Call ahead before visiting your doctor Before your medical appointment, call the healthcare provider and tell them that you have, or are being evaluated for, COVID-19 infection. This will help the healthcare providers office take steps to keep other people from getting infected. Ask your healthcare provider to call the local or state health department.  Monitor your symptoms Seek prompt medical attention if your illness is worsening (e.g., difficulty breathing). Before going to your medical appointment, call the healthcare provider and tell them that you have, or are being evaluated for, COVID-19 infection. Ask your healthcare provider to call the local or state health department.  Wear a facemask You should wear a facemask that covers your nose and mouth when you are in the same room with other people and when you visit a healthcare provider. People who live with or visit you should also wear a facemask while they are in the same room with you.  Separate yourself from other people in your home As much as possible, you should stay in a different room from other people in your home. Also, you should use a separate bathroom, if available.  Avoid sharing household items You should not share  dishes, drinking glasses, cups, eating utensils, towels, bedding, or other items with other people in your home. After using these items, you should wash them thoroughly with soap and water.  Cover your coughs and sneezes Cover your mouth and nose with a tissue when you cough or sneeze, or you can cough or sneeze into your sleeve. Throw used tissues in a lined trash can, and immediately wash your hands with soap and water for at least 20 seconds or use an alcohol-based hand rub.  Wash your [employer] your hands often and thoroughly with soap and water for at least 20 seconds. You can use an alcohol-based hand sanitizer if soap and water are not available and if your hands are not visibly dirty. Avoid touching your eyes, nose, and mouth with unwashed hands.   Prevention Steps for Caregivers and Household Members of Individuals Confirmed to have, or Being Evaluated for, COVID-19 Infection Being Cared for in the Home  If you live with, or provide care at home for, a person confirmed to have, or being evaluated for, COVID-19 infection please follow these guidelines to prevent infection:  Follow healthcare providers instructions Make sure that you understand and can help the patient follow any healthcare provider instructions for all care.  Provide for the patients basic needs You should help the patient with basic needs in the home and provide support for getting groceries, prescriptions, and other personal needs.  Monitor the patients symptoms If they are getting sicker, call his or her medical provider and tell them that the patient has, or is being evaluated for, COVID-19 infection. This will help the healthcare providers office take  steps to keep other people from getting infected. Ask the healthcare provider to call the local or state health department.  Limit the number of people who have contact with the patient If possible, have only one caregiver for the patient. Other  household members should stay in another home or place of residence. If this is not possible, they should stay in another room, or be separated from the patient as much as possible. Use a separate bathroom, if available. Restrict visitors who do not have an essential need to be in the home.  Keep older adults, very young children, and other sick people away from the patient Keep older adults, very young children, and those who have compromised immune systems or chronic health conditions away from the patient. This includes people with chronic heart, lung, or kidney conditions, diabetes, and cancer.  Ensure good ventilation Make sure that shared spaces in the home have good air flow, such as from an air conditioner or an opened window, weather permitting.  Wash your hands often Wash your hands often and thoroughly with soap and water for at least 20 seconds. You can use an alcohol based hand sanitizer if soap and water are not available and if your hands are not visibly dirty. Avoid touching your eyes, nose, and mouth with unwashed hands. Use disposable paper towels to dry your hands. If not available, use dedicated cloth towels and replace them when they become wet.  Wear a facemask and gloves Wear a disposable facemask at all times in the room and gloves when you touch or have contact with the patients blood, body fluids, and/or secretions or excretions, such as sweat, saliva, sputum, nasal mucus, vomit, urine, or feces.  Ensure the mask fits over your nose and mouth tightly, and do not touch it during use. Throw out disposable facemasks and gloves after using them. Do not reuse. Wash your hands immediately after removing your facemask and gloves. If your personal clothing becomes contaminated, carefully remove clothing and launder. Wash your hands after handling contaminated clothing. Place all used disposable facemasks, gloves, and other waste in a lined container before disposing them with  other household waste. Remove gloves and wash your hands immediately after handling these items.  Do not share dishes, glasses, or other household items with the patient Avoid sharing household items. You should not share dishes, drinking glasses, cups, eating utensils, towels, bedding, or other items with a patient who is confirmed to have, or being evaluated for, COVID-19 infection. After the person uses these items, you should wash them thoroughly with soap and water.  Wash laundry thoroughly Immediately remove and wash clothes or bedding that have blood, body fluids, and/or secretions or excretions, such as sweat, saliva, sputum, nasal mucus, vomit, urine, or feces, on them. Wear gloves when handling laundry from the patient. Read and follow directions on labels of laundry or clothing items and detergent. In general, wash and dry with the warmest temperatures recommended on the label.  Clean all areas the individual has used often Clean all touchable surfaces, such as counters, tabletops, doorknobs, bathroom fixtures, toilets, phones, keyboards, tablets, and bedside tables, every day. Also, clean any surfaces that may have blood, body fluids, and/or secretions or excretions on them. Wear gloves when cleaning surfaces the patient has come in contact with. Use a diluted bleach solution (e.g., dilute bleach with 1 part bleach and 10 parts water) or a household disinfectant with a label that says EPA-registered for coronaviruses. To make a bleach solution  at home, add 1 tablespoon of bleach to 1 quart (4 cups) of water. For a larger supply, add  cup of bleach to 1 gallon (16 cups) of water. Read labels of cleaning products and follow recommendations provided on product labels. Labels contain instructions for safe and effective use of the cleaning product including precautions you should take when applying the product, such as wearing gloves or eye protection and making sure you have good ventilation  during use of the product. Remove gloves and wash hands immediately after cleaning.  Monitor yourself for signs and symptoms of illness Caregivers and household members are considered close contacts, should monitor their health, and will be asked to limit movement outside of the home to the extent possible. Follow the monitoring steps for close contacts listed on the symptom monitoring form.   ? If you have additional questions, contact your local health department or call the epidemiologist on call at [phone] (available 24/7). ? This guidance is subject to change. For the most up-to-date guidance from [facility], please refer to their website: [email or web address]

## 2018-12-15 NOTE — ED Provider Notes (Signed)
[facility] EMERGENCY DEPARTMENT Provider Note   CSN: [id number] Arrival date & time: 12/15/18  1222    History   Chief Complaint Chief Complaint  Patient presents with  . Chills  . Diarrhea    HPI Amanda [name] is a 18 y.o. female.     HPI Patient with 4 days of sore throat, nonproductive cough.  No shortness of breath.  No fever or chills.  States she has had some nasal congestion and frontal sinus pressure.  She developed vomiting, diarrhea and abdominal pain over the last few days.  No blood in the vomit or stool.  Denies urinary symptoms.  No known sick contacts.  No recent travel. Past Medical History:  Diagnosis Date  . Asthma   . Attention deficit hyperactivity disorder   . Depression   . Eczema   . Mood disorder (HCC)   . Oppositional defiant disorder     Patient Active Problem List   Diagnosis Date Noted  . DMDD (disruptive mood dysregulation disorder) (HCC) 01/14/2014  . PTSD (post-traumatic stress disorder) 01/14/2014  . Deliberate self-cutting 01/14/2014  . Autism spectrum 01/14/2014  . ADHD (attention deficit hyperactivity disorder) 01/14/2014  . Aggression 01/14/2014  . Suicidal ideation 01/14/2014  . ODD (oppositional defiant disorder) 01/13/2014  . Oppositional defiant disorder 11/04/2013    Past Surgical History:  Procedure Laterality Date  . ADENOIDECTOMY    . TYMPANOSTOMY TUBE PLACEMENT       OB History   No obstetric history on file.      Home Medications    Prior to Admission medications   Medication Sig Start Date End Date Taking? Authorizing Provider  ADDERALL XR 20 MG 24 hr capsule Take 20 mg by mouth daily after lunch. 02/22/18   [provider]  albuterol (PROVENTIL HFA;VENTOLIN HFA) 108 (90 Base) MCG/ACT inhaler Inhale 1-2 puffs into the lungs every 6 (six) hours as needed for wheezing or shortness of breath.    [provider]  bisacodyl (DULCOLAX) 5 MG EC tablet Take 1 tablet (5 mg total)  by mouth 2 (two) times daily. 08/21/18   Fayrene Helper, PA-C  citalopram (CELEXA) 20 MG tablet Take 1 tablet (20 mg total) by mouth daily. Patient taking differently: Take 30 mg by mouth daily.  01/16/14   Winson, Louie Bun, NP  cloZAPine (CLOZARIL) 100 MG tablet Take 200 mg by mouth every evening.     [provider]  clozapine (CLOZARIL) 50 MG tablet Take 50 mg by mouth every morning.     [provider]  dicyclomine (BENTYL) 20 MG tablet Take 1 tablet (20 mg total) by mouth every 12 (twelve) hours as needed (abdominal pain/cramping). 04/06/18   Antony Madura, PA-C  etonogestrel (NEXPLANON) 68 MG IMPL implant 1 each by Subdermal route once.    [provider]  fluticasone (FLONASE) 50 MCG/ACT nasal spray Place 2 sprays into both nostrils daily. 12/15/18   Loren Racer, MD  folic acid (FOLVITE) 1 MG tablet Take 1 mg by mouth daily. 02/15/18   [provider]  levothyroxine (SYNTHROID, LEVOTHROID) 50 MCG tablet Take 50 mcg by mouth daily before breakfast.    [provider]  lithium carbonate (ESKALITH) 450 MG CR tablet Take 1 tablet (450 mg total) by mouth 2 (two) times daily. Patient taking differently: Take 300 mg by mouth 3 (three) times daily.  01/16/14   Winson, Louie Bun, NP  loratadine (CLARITIN) 10 MG tablet Take 1 tablet (10 mg total) by mouth  daily. 12/15/18   Loren Racer, MD  montelukast (SINGULAIR) 10 MG tablet Take 10 mg by mouth at bedtime.    [provider]  ondansetron (ZOFRAN) 4 MG tablet Take 1 tablet (4 mg total) by mouth every 6 (six) hours as needed for nausea or vomiting. 12/15/18   Loren Racer, MD  polyethylene glycol (MIRALAX / Ethelene Hal) packet Take 17 g by mouth daily. 08/21/18   Fayrene Helper, PA-C  valACYclovir (VALTREX) 500 MG tablet Take 500 mg by mouth daily.    [provider]  VYVANSE 70 MG capsule Take 70 mg by mouth every morning. 02/16/18   [provider]    Family History History reviewed. No  pertinent family history.  Social History Social History   Tobacco Use  . Smoking status: Never Smoker  . Smokeless tobacco: Never Used  Substance Use Topics  . Alcohol use: No  . Drug use: No     Allergies   Poison ivy extract [poison ivy extract] and Sulfa antibiotics   Review of Systems Review of Systems  Constitutional: Negative for chills and fever.  HENT: Positive for congestion, sinus pressure, sinus pain and sore throat.   Eyes: Negative for visual disturbance.  Respiratory: Positive for cough. Negative for shortness of breath.   Cardiovascular: Negative for chest pain, palpitations and leg swelling.  Gastrointestinal: Positive for abdominal pain, diarrhea, nausea and vomiting. Negative for blood in stool and constipation.  Genitourinary: Negative for dysuria and flank pain.  Musculoskeletal: Negative for back pain, myalgias and neck pain.  Skin: Negative for rash and wound.  Neurological: Positive for headaches. Negative for dizziness, weakness, light-headedness and numbness.  All other systems reviewed and are negative.    Physical Exam Updated Vital Signs BP 104/78   Pulse (!) 104   Temp 98.8 F (37.1 C) (Oral)   Resp 20   Ht  (1.676 m)   Wt 98.4 kg   SpO2 100%   BMI 35.02 kg/m   Physical Exam Vitals signs and nursing note reviewed.  Constitutional:      Appearance: Normal appearance. She is well-developed.  HENT:     Head: Normocephalic and atraumatic.     Comments: Frontal sinus tenderness to percussion.    Nose: Congestion present.     Mouth/Throat:     Pharynx: Posterior oropharyngeal erythema present. No oropharyngeal exudate.  Eyes:     Pupils: Pupils are equal, round, and reactive to light.  Neck:     Musculoskeletal: Normal range of motion and neck supple. No neck rigidity or muscular tenderness.  Cardiovascular:     Rate and Rhythm: Regular rhythm. Tachycardia present.     Heart sounds: No murmur. No friction rub. No gallop.    Pulmonary:     Effort: Pulmonary effort is normal. No respiratory distress.     Breath sounds: Normal breath sounds. No stridor. No wheezing, rhonchi or rales.  Chest:     Chest wall: No tenderness.  Abdominal:     General: Bowel sounds are normal. There is no distension.     Palpations: Abdomen is soft. There is no mass.     Tenderness: There is no abdominal tenderness. There is no right CVA tenderness, left CVA tenderness, guarding or rebound.     Comments: Nontender.  No rebound or guarding.  Musculoskeletal: Normal range of motion.        General: No swelling, tenderness, deformity or signs of injury.     Right lower leg: No edema.  Lymphadenopathy:     Cervical: No cervical adenopathy.  Skin:    General: Skin is warm and dry.     Findings: No erythema or rash.  Neurological:     General: No focal deficit present.     Mental Status: She is alert and oriented to person, place, and time.     Comments: 5/5 motor in all extremities.  Sensation intact.  Psychiatric:        Mood and Affect: Mood normal.        Behavior: Behavior normal.      ED Treatments / Results  Labs (all labs ordered are listed, but only abnormal results are displayed) Labs Reviewed  CBC WITH DIFFERENTIAL/PLATELET - Abnormal; Notable for the following components:      Result Value   WBC 12.5 (*)    Neutro Abs 7.8 (*)    Monocytes Absolute 1.5 (*)    Abs Immature Granulocytes 0.10 (*)    All other components within normal limits  COMPREHENSIVE METABOLIC PANEL - Abnormal; Notable for the following components:   Potassium 3.4 (*)    CO2 20 (*)    Glucose, Bld 100 (*)    Albumin 3.4 (*)    All other components within normal limits  URINALYSIS, ROUTINE W REFLEX MICROSCOPIC - Abnormal; Notable for the following components:   APPearance HAZY (*)    Hgb urine dipstick SMALL (*)    Leukocytes,Ua TRACE (*)    Bacteria, UA MANY (*)    Non Squamous Epithelial 0-5 (*)    All other components within normal  limits  GROUP A STREP BY PCR  URINE CULTURE  LIPASE, BLOOD  I-STAT BETA HCG BLOOD, ED ([facility], [facility], [facility] ONLY)    EKG None  Radiology No results found.  Procedures Procedures (including critical care time)  Medications Ordered in ED Medications  sodium chloride 0.9 % bolus 1,000 mL (0 mLs Intravenous Stopped 12/15/18 1703)  ondansetron (ZOFRAN) injection 4 mg (4 mg Intravenous Given 12/15/18 1344)  sodium chloride 0.9 % bolus 1,000 mL (0 mLs Intravenous Stopped 12/15/18 1703)     Initial Impression / Assessment and Plan / ED Course  I have reviewed the triage vital signs and the nursing notes.  Pertinent labs & imaging results that were available during my care of the patient were reviewed by me and considered in my medical decision making (see chart for details).       Patient is well-appearing.  No further vomiting while in the emergency department.  Initial tachycardia has significantly improved.  Patient does have bacteria in her urine but there is also contaminated sample.  She is not complaining of urinary symptoms.  Urine sent for culture.  Told that coronavirus cannot be ruled out and that she needs to go home and self quarantine until she is asymptomatic.  Patient understands discharge instructions.  Return precautions given.   Final Clinical Impressions(s) / ED Diagnoses   Final diagnoses:  Viral upper respiratory tract infection  Vomiting and diarrhea    ED Discharge Orders         Ordered    ondansetron (ZOFRAN) 4 MG tablet  Every 6 hours PRN     12/15/18 1527    loratadine (CLARITIN) 10 MG tablet  Daily     12/15/18 1527    fluticasone (FLONASE) 50 MCG/ACT nasal spray  Daily     12/15/18 1527           Loren Racer, MD 12/18/18 2223

## 2018-12-15 NOTE — ED Triage Notes (Signed)
Patient complaining of cough, sore throat, vomiting, diarrhea and chills X4 days.

## 2018-12-15 NOTE — ED Notes (Signed)
Called lab to have them add on culture to UA. They agreed to do so.

## 2018-12-15 NOTE — ED Notes (Signed)
Patient verbalizes understanding of discharge instructions. Opportunity for questioning and answers were provided. Armband removed by staff, pt discharged from ED.  

## 2018-12-16 LAB — URINE CULTURE

## 2019-01-08 ENCOUNTER — Emergency Department (HOSPITAL_COMMUNITY)
Admission: EM | Admit: 2019-01-08 | Discharge: 2019-01-08 | Disposition: A | Discharge status: Home / Self Care | Payer: Medicaid Other | Attending: Emergency Medicine | Admitting: Emergency Medicine

## 2019-01-08 ENCOUNTER — Emergency Department (HOSPITAL_COMMUNITY): Payer: Medicaid Other

## 2019-01-08 ENCOUNTER — Encounter (HOSPITAL_COMMUNITY): Payer: Self-pay

## 2019-01-08 DIAGNOSIS — Z79899 Other long term (current) drug therapy: Secondary | ICD-10-CM | POA: Diagnosis not present

## 2019-01-08 DIAGNOSIS — R0602 Shortness of breath: Secondary | ICD-10-CM | POA: Diagnosis present

## 2019-01-08 DIAGNOSIS — R0600 Dyspnea, unspecified: Secondary | ICD-10-CM | POA: Insufficient documentation

## 2019-01-08 DIAGNOSIS — J45909 Unspecified asthma, uncomplicated: Secondary | ICD-10-CM | POA: Diagnosis not present

## 2019-01-08 LAB — POC URINE PREG, ED: Preg Test, Ur: NEGATIVE

## 2019-01-08 NOTE — ED Triage Notes (Signed)
Pt arrived with complaints of shortness of breath for 5 days and lightheaded. Pt appears anxious and has a history of behavioral issues. Pt appears to be in no distress at this time.

## 2019-01-08 NOTE — ED Provider Notes (Signed)
[facility]-EMERGENCY DEPT Provider Note   CSN: [id number] Arrival date & time: 01/08/19  2025    History   Chief Complaint Chief Complaint  Patient presents with  . Shortness of Breath  . Anxiety    HPI Amanda [name] is a 18 y.o. female.     18 year old female presents with shortness of breath for the past 5 days.  Symptoms come and go and are not associated with cough or fever.  No chest pain or leg discomfort.  Nothing seems to bring out her symptoms.  She has used an albuterol inhaler without much relief.  Patient has been exposed to cigarette smoke and thinks that this may be the culprit.  Patient states that she is lightheaded but denies any syncope or near syncope.     Past Medical History:  Diagnosis Date  . Asthma   . Attention deficit hyperactivity disorder   . Depression   . Eczema   . Mood disorder (HCC)   . Oppositional defiant disorder     Patient Active Problem List   Diagnosis Date Noted  . DMDD (disruptive mood dysregulation disorder) (HCC) 01/14/2014  . PTSD (post-traumatic stress disorder) 01/14/2014  . Deliberate self-cutting 01/14/2014  . Autism spectrum 01/14/2014  . ADHD (attention deficit hyperactivity disorder) 01/14/2014  . Aggression 01/14/2014  . Suicidal ideation 01/14/2014  . ODD (oppositional defiant disorder) 01/13/2014  . Oppositional defiant disorder 11/04/2013    Past Surgical History:  Procedure Laterality Date  . ADENOIDECTOMY    . TYMPANOSTOMY TUBE PLACEMENT       OB History   No obstetric history on file.      Home Medications    Prior to Admission medications   Medication Sig Start Date End Date Taking? Authorizing Provider  ADDERALL XR 20 MG 24 hr capsule Take 20 mg by mouth daily after lunch. 02/22/18   [provider]  albuterol (PROVENTIL HFA;VENTOLIN HFA) 108 (90 Base) MCG/ACT inhaler Inhale 1-2 puffs into the lungs every 6 (six) hours as needed for wheezing or shortness of  breath.    [provider]  bisacodyl (DULCOLAX) 5 MG EC tablet Take 1 tablet (5 mg total) by mouth 2 (two) times daily. 08/21/18   Fayrene Helper, PA-C  citalopram (CELEXA) 20 MG tablet Take 1 tablet (20 mg total) by mouth daily. Patient taking differently: Take 30 mg by mouth daily.  01/16/14   Winson, Louie Bun, NP  cloZAPine (CLOZARIL) 100 MG tablet Take 200 mg by mouth every evening.     [provider]  clozapine (CLOZARIL) 50 MG tablet Take 50 mg by mouth every morning.     [provider]  dicyclomine (BENTYL) 20 MG tablet Take 1 tablet (20 mg total) by mouth every 12 (twelve) hours as needed (abdominal pain/cramping). 04/06/18   Antony Madura, PA-C  etonogestrel (NEXPLANON) 68 MG IMPL implant 1 each by Subdermal route once.    [provider]  fluticasone (FLONASE) 50 MCG/ACT nasal spray Place 2 sprays into both nostrils daily. 12/15/18   Loren Racer, MD  folic acid (FOLVITE) 1 MG tablet Take 1 mg by mouth daily. 02/15/18   [provider]  levothyroxine (SYNTHROID, LEVOTHROID) 50 MCG tablet Take 50 mcg by mouth daily before breakfast.    [provider]  lithium carbonate (ESKALITH) 450 MG CR tablet Take 1 tablet (450 mg total) by mouth 2 (two) times daily. Patient taking differently: Take 300 mg by mouth 3 (three) times daily.  01/16/14  Winson, Louie Bun, NP  loratadine (CLARITIN) 10 MG tablet Take 1 tablet (10 mg total) by mouth daily. 12/15/18   Loren Racer, MD  montelukast (SINGULAIR) 10 MG tablet Take 10 mg by mouth at bedtime.    [provider]  ondansetron (ZOFRAN) 4 MG tablet Take 1 tablet (4 mg total) by mouth every 6 (six) hours as needed for nausea or vomiting. 12/15/18   Loren Racer, MD  polyethylene glycol (MIRALAX / Ethelene Hal) packet Take 17 g by mouth daily. 08/21/18   Fayrene Helper, PA-C  valACYclovir (VALTREX) 500 MG tablet Take 500 mg by mouth daily.    [provider]  VYVANSE 70 MG capsule Take 70 mg by  mouth every morning. 02/16/18   [provider]    Family History No family history on file.  Social History Social History   Tobacco Use  . Smoking status: Never Smoker  . Smokeless tobacco: Never Used  Substance Use Topics  . Alcohol use: No  . Drug use: No     Allergies   Poison ivy extract [poison ivy extract] and Sulfa antibiotics   Review of Systems Review of Systems  All other systems reviewed and are negative.    Physical Exam Updated Vital Signs BP 116/88 (BP Location: Left Wrist)   Pulse 100   Temp 98.4 F (36.9 C) (Oral)   Resp 20   Ht 1.676 m ( )   Wt 100.2 kg   SpO2 98%   BMI 35.67 kg/m   Physical Exam Vitals signs and nursing note reviewed.  Constitutional:      General: She is not in acute distress.    Appearance: Normal appearance. She is well-developed. She is not toxic-appearing.  HENT:     Head: Normocephalic and atraumatic.  Eyes:     General: Lids are normal.     Conjunctiva/sclera: Conjunctivae normal.     Pupils: Pupils are equal, round, and reactive to light.  Neck:     Musculoskeletal: Normal range of motion and neck supple.     Thyroid: No thyroid mass.     Trachea: No tracheal deviation.  Cardiovascular:     Rate and Rhythm: Normal rate and regular rhythm.     Heart sounds: Normal heart sounds. No murmur. No gallop.   Pulmonary:     Effort: Pulmonary effort is normal. No respiratory distress.     Breath sounds: No stridor. Examination of the right-lower field reveals decreased breath sounds. Examination of the left-lower field reveals decreased breath sounds. Decreased breath sounds present. No wheezing, rhonchi or rales.  Abdominal:     General: Bowel sounds are normal. There is no distension.     Palpations: Abdomen is soft.     Tenderness: There is no abdominal tenderness. There is no rebound.  Musculoskeletal: Normal range of motion.        General: No tenderness.  Skin:    General: Skin is warm and dry.      Findings: No abrasion or rash.  Neurological:     Mental Status: She is alert and oriented to person, place, and time.     GCS: GCS eye subscore is 4. GCS verbal subscore is 5. GCS motor subscore is 6.     Cranial Nerves: No cranial nerve deficit.     Sensory: No sensory deficit.  Psychiatric:        Speech: Speech normal.        Behavior: Behavior normal.      ED  Treatments / Results  Labs (all labs ordered are listed, but only abnormal results are displayed) Labs Reviewed  POC URINE PREG, ED    EKG None  Radiology No results found.  Procedures Procedures (including critical care time)  Medications Ordered in ED Medications - No data to display   Initial Impression / Assessment and Plan / ED Course  I have reviewed the triage vital signs and the nursing notes.  Pertinent labs & imaging results that were available during my care of the patient were reviewed by me and considered in my medical decision making (see chart for details).       Chest x-ray is negative at this time.  She is not tachypneic.  She has no leg pain.  She has not had any pleuritic discomfort.  Low suspicion for ACS or PE.  She feels better and request to go home  Final Clinical Impressions(s) / ED Diagnoses   Final diagnoses:  None    ED Discharge Orders    None       Lorre NickAllen, Dereke Neumann, MD 01/08/19 2231

## 2019-02-21 ENCOUNTER — Other Ambulatory Visit: Payer: Self-pay | Admitting: Internal Medicine

## 2019-03-07 ENCOUNTER — Encounter: Payer: Self-pay | Admitting: *Deleted

## 2019-04-11 ENCOUNTER — Other Ambulatory Visit: Payer: Self-pay

## 2019-04-11 ENCOUNTER — Other Ambulatory Visit (HOSPITAL_COMMUNITY)
Admission: RE | Admit: 2019-04-11 | Discharge: 2019-04-11 | Disposition: A | Discharge status: Home / Self Care | Payer: Medicaid Other | Source: Ambulatory Visit | Attending: Obstetrics & Gynecology | Admitting: Obstetrics & Gynecology

## 2019-04-11 ENCOUNTER — Encounter: Payer: Self-pay | Admitting: Obstetrics & Gynecology

## 2019-04-11 ENCOUNTER — Ambulatory Visit (INDEPENDENT_AMBULATORY_CARE_PROVIDER_SITE_OTHER): Payer: Medicaid Other | Admitting: Obstetrics & Gynecology

## 2019-04-11 VITALS — BP 125/83 | HR 108 | Wt 232.0 lb

## 2019-04-11 DIAGNOSIS — Z30430 Encounter for insertion of intrauterine contraceptive device: Secondary | ICD-10-CM | POA: Diagnosis not present

## 2019-04-11 DIAGNOSIS — Z3046 Encounter for surveillance of implantable subdermal contraceptive: Secondary | ICD-10-CM

## 2019-04-11 DIAGNOSIS — Z3040 Encounter for surveillance of contraceptives, unspecified: Secondary | ICD-10-CM | POA: Diagnosis present

## 2019-04-11 MED ORDER — LEVONORGESTREL 19.5 MCG/DAY IU IUD
INTRAUTERINE_SYSTEM | Freq: Once | INTRAUTERINE | Status: AC
  Administered 2019-04-11 (×2): via INTRAUTERINE

## 2019-04-11 NOTE — Progress Notes (Signed)
   Subjective:    Patient ID: Amanda [name], female    DOB: Sep 09, 2001, 18 y.o.   MRN: [id number]  HPI 18 yo single G0 here with her mom to have her newly expiring Nexplanon replaced with a Liletta. She does not want another Nexplanon, bleeds all the time. She has heavy periods, had an IUD in the past.  Review of Systems She is sexually active.    Objective:   Physical Exam Breathing, conversing, and ambulating normally Well nourished, well hydrated White female, no apparent distress  Consent was signed. Time out procedure was done. Her left arm was prepped with betadine and infiltrated with 3 cc of 1% lidocaine. After adequate anesthesia was assured, the Nexplanon device was placed according to standard of care. Her arm was hemostatic and was bandaged. She tolerated the procedure well. Cervix prepped with betadine and grasped with a single tooth tenaculum. Liletta was easily placed and the strings were cut to 3-4 cm. Uterus sounded to 8 cm. She tolerated the procedure well.    Assessment & Plan:  STI testing per patient request Contraception- Liletta 4 week string check

## 2019-04-11 NOTE — Addendum Note (Signed)
Addended by: Alric Seton on: 04/11/2019 03:50 PM   Modules accepted: Orders

## 2019-04-13 LAB — HIV ANTIBODY (ROUTINE TESTING W REFLEX): HIV Screen 4th Generation wRfx: NONREACTIVE

## 2019-04-13 LAB — RPR: RPR Ser Ql: NONREACTIVE

## 2019-04-13 LAB — CERVICOVAGINAL ANCILLARY ONLY
Bacterial vaginitis: NEGATIVE
Candida vaginitis: NEGATIVE
Chlamydia: NEGATIVE
Neisseria Gonorrhea: NEGATIVE
Trichomonas: NEGATIVE

## 2019-05-01 ENCOUNTER — Encounter: Payer: Self-pay | Admitting: *Deleted

## 2019-05-18 ENCOUNTER — Telehealth: Payer: Self-pay | Admitting: Family Medicine

## 2019-05-18 NOTE — Telephone Encounter (Signed)
Called and spoke with patient to remind her of her upcoming appt on 05-21-2019, she understood, she asked that I call her Mother, I called her to give the appointment information with no answer.

## 2019-05-21 ENCOUNTER — Ambulatory Visit: Payer: Medicaid Other | Admitting: Obstetrics & Gynecology

## 2019-05-28 ENCOUNTER — Emergency Department (HOSPITAL_COMMUNITY)
Admission: EM | Admit: 2019-05-28 | Discharge: 2019-05-28 | Disposition: A | Discharge status: Home / Self Care | Payer: Medicaid Other | Attending: Emergency Medicine | Admitting: Emergency Medicine

## 2019-05-28 ENCOUNTER — Emergency Department (HOSPITAL_COMMUNITY): Payer: Medicaid Other

## 2019-05-28 ENCOUNTER — Encounter (HOSPITAL_COMMUNITY): Payer: Self-pay

## 2019-05-28 ENCOUNTER — Other Ambulatory Visit: Payer: Self-pay

## 2019-05-28 DIAGNOSIS — Z79899 Other long term (current) drug therapy: Secondary | ICD-10-CM | POA: Diagnosis not present

## 2019-05-28 DIAGNOSIS — R42 Dizziness and giddiness: Secondary | ICD-10-CM | POA: Insufficient documentation

## 2019-05-28 DIAGNOSIS — R531 Weakness: Secondary | ICD-10-CM | POA: Diagnosis not present

## 2019-05-28 DIAGNOSIS — J45909 Unspecified asthma, uncomplicated: Secondary | ICD-10-CM | POA: Insufficient documentation

## 2019-05-28 LAB — COMPREHENSIVE METABOLIC PANEL
ALT: 20 U/L (ref 0–44)
AST: 21 U/L (ref 15–41)
Albumin: 4.3 g/dL (ref 3.5–5.0)
Alkaline Phosphatase: 77 U/L (ref 38–126)
Anion gap: 9 (ref 5–15)
BUN: 8 mg/dL (ref 6–20)
CO2: 23 mmol/L (ref 22–32)
Calcium: 9.3 mg/dL (ref 8.9–10.3)
Chloride: 108 mmol/L (ref 98–111)
Creatinine, Ser: 0.76 mg/dL (ref 0.44–1.00)
GFR calc Af Amer: >60 mL/min (ref >60)
GFR calc non Af Amer: >60 mL/min (ref >60)
Glucose, Bld: 96 mg/dL (ref 70–99)
Potassium: 4 mmol/L (ref 3.5–5.1)
Sodium: 140 mmol/L (ref 135–145)
Total Bilirubin: 0.5 mg/dL (ref 0.3–1.2)
Total Protein: 7.2 g/dL (ref 6.5–8.1)

## 2019-05-28 LAB — URINALYSIS, ROUTINE W REFLEX MICROSCOPIC
Bilirubin Urine: NEGATIVE
Glucose, UA: NEGATIVE mg/dL
Ketones, ur: NEGATIVE mg/dL
Leukocytes,Ua: NEGATIVE
Nitrite: NEGATIVE
Protein, ur: NEGATIVE mg/dL
Specific Gravity, Urine: 1.018 (ref 1.005–1.030)
pH: 6 (ref 5.0–8.0)

## 2019-05-28 LAB — POC URINE PREG, ED: Preg Test, Ur: NEGATIVE

## 2019-05-28 LAB — CBC WITH DIFFERENTIAL/PLATELET
Abs Immature Granulocytes: 0.04 10*3/uL (ref 0.00–0.07)
Basophils Absolute: 0 10*3/uL (ref 0.0–0.1)
Basophils Relative: 0 %
Eosinophils Absolute: 0 10*3/uL (ref 0.0–0.5)
Eosinophils Relative: 0 %
HCT: 39.3 % (ref 36.0–46.0)
Hemoglobin: 12.8 g/dL (ref 12.0–15.0)
Immature Granulocytes: 0 %
Lymphocytes Relative: 38 %
Lymphs Abs: 4 10*3/uL (ref 0.7–4.0)
MCH: 28.3 pg (ref 26.0–34.0)
MCHC: 32.6 g/dL (ref 30.0–36.0)
MCV: 86.9 fL (ref 80.0–100.0)
Monocytes Absolute: 0.7 10*3/uL (ref 0.1–1.0)
Monocytes Relative: 6 %
Neutro Abs: 5.9 10*3/uL (ref 1.7–7.7)
Neutrophils Relative %: 56 %
Platelets: 300 10*3/uL (ref 150–400)
RBC: 4.52 MIL/uL (ref 3.87–5.11)
RDW: 13.2 % (ref 11.5–15.5)
WBC: 10.6 10*3/uL — ABNORMAL HIGH (ref 4.0–10.5)
nRBC: 0 % (ref 0.0–0.2)

## 2019-05-28 MED ORDER — SODIUM CHLORIDE 0.9 % IV BOLUS
1000.0000 mL | Freq: Once | INTRAVENOUS | Status: AC
  Administered 2019-05-28: 1000 mL via INTRAVENOUS

## 2019-05-28 NOTE — ED Triage Notes (Signed)
EMS reports from home, Pt c/o lightheadedness since this morning. Pt had IUD replaced in August. Pt denies pain.  BP 160 HR 90 RR 16 Sp02 98 RA CBG 100 Temp 97.9 Temporal

## 2019-05-28 NOTE — ED Provider Notes (Signed)
[facility] DEPT Provider Note   CSN: [id number] Arrival date & time: 05/28/19  1742     History   Chief Complaint Chief Complaint  Patient presents with  . Dizziness    HPI Allene Willert is a 18 y.o. female.     The history is provided by the patient and medical records. No language interpreter was used.  Dizziness Associated symptoms: weakness (Generalized)   Associated symptoms: no headaches    Ceceilia Cornelio is a 18 y.o. female  with a PMH as listed below who presents to the Emergency Department complaining of lightheadedness generalized weakness which began last night.  This morning, she felt as if she was going to faint, but denies actual syncopal episode.  She sat down and drank a lot of water then felt better.  No other meds or treatments prior to arrival for symptoms.  Denies chest pain or shortness of breath.  No fever or chills. No urinary symptoms. No abdominal pain, nausea, vomiting, diarrhea.  Denies headache or dizziness.  No visual changes.  No sick contacts.  Past Medical History:  Diagnosis Date  . Asthma   . Attention deficit hyperactivity disorder   . Depression   . Eczema   . Mood disorder ([place])   . Oppositional defiant disorder     Patient Active Problem List   Diagnosis Date Noted  . DMDD (disruptive mood dysregulation disorder) ([place]) 01/14/2014  . PTSD (post-traumatic stress disorder) 01/14/2014  . Deliberate self-cutting 01/14/2014  . Autism spectrum 01/14/2014  . ADHD (attention deficit hyperactivity disorder) 01/14/2014  . Aggression 01/14/2014  . Suicidal ideation 01/14/2014  . ODD (oppositional defiant disorder) 01/13/2014  . Oppositional defiant disorder 11/04/2013    Past Surgical History:  Procedure Laterality Date  . ADENOIDECTOMY    . TYMPANOSTOMY TUBE PLACEMENT       OB History   No obstetric history on file.      Home Medications    Prior to Admission medications   Medication Sig Start  Date End Date Taking? Authorizing Provider  ADDERALL XR 20 MG 24 hr capsule Take 20 mg by mouth daily after lunch. 02/22/18  Yes [provider]  albuterol (PROVENTIL HFA;VENTOLIN HFA) 108 (90 Base) MCG/ACT inhaler Inhale 1-2 puffs into the lungs every 6 (six) hours as needed for wheezing or shortness of breath.   Yes [provider]  citalopram (CELEXA) 20 MG tablet Take 1 tablet (20 mg total) by mouth daily. Patient taking differently: Take 30 mg by mouth daily.  01/16/14  Yes Winson, Manus Rudd, NP  cloZAPine (CLOZARIL) 100 MG tablet Take 200 mg by mouth every evening.    Yes [provider]  clozapine (CLOZARIL) 50 MG tablet Take 50 mg by mouth every morning.    Yes [provider]  dicyclomine (BENTYL) 20 MG tablet Take 1 tablet (20 mg total) by mouth every 12 (twelve) hours as needed (abdominal pain/cramping). 04/06/18  Yes Antonietta Breach, PA-C  fluticasone (FLONASE) 50 MCG/ACT nasal spray Place 2 sprays into both nostrils daily. 12/15/18  Yes Julianne Rice, MD  folic acid (FOLVITE) 1 MG tablet Take 1 mg by mouth daily. 02/15/18  Yes [provider]  levothyroxine (SYNTHROID, LEVOTHROID) 50 MCG tablet Take 50 mcg by mouth daily before breakfast.   Yes [provider]  lithium carbonate (ESKALITH) 450 MG CR tablet Take 1 tablet (450 mg total) by mouth 2 (two) times daily. Patient taking differently: Take 300 mg by mouth 3 (three) times  daily.  01/16/14  Yes Winson, Louie Bun, NP  montelukast (SINGULAIR) 10 MG tablet Take 10 mg by mouth at bedtime.   Yes [provider]  ondansetron (ZOFRAN) 4 MG tablet Take 1 tablet (4 mg total) by mouth every 6 (six) hours as needed for nausea or vomiting. 12/15/18  Yes Loren Racer, MD  VYVANSE 70 MG capsule Take 70 mg by mouth every morning. 02/16/18  Yes [provider]  bisacodyl (DULCOLAX) 5 MG EC tablet Take 1 tablet (5 mg total) by mouth 2 (two) times daily. Patient not taking: Reported on  05/28/2019 08/21/18   Fayrene Helper, PA-C  loratadine (CLARITIN) 10 MG tablet Take 1 tablet (10 mg total) by mouth daily. Patient not taking: Reported on 05/28/2019 12/15/18   Loren Racer, MD  polyethylene glycol [facility] / Ethelene Hal) packet Take 17 g by mouth daily. Patient not taking: Reported on 05/28/2019 08/21/18   Fayrene Helper, PA-C    Family History History reviewed. No pertinent family history.  Social History Social History   Tobacco Use  . Smoking status: Never Smoker  . Smokeless tobacco: Never Used  Substance Use Topics  . Alcohol use: No  . Drug use: No     Allergies   Poison ivy extract [poison ivy extract] and Sulfa antibiotics   Review of Systems Review of Systems  Neurological: Positive for weakness (Generalized) and light-headedness. Negative for dizziness, syncope, numbness and headaches.  All other systems reviewed and are negative.    Physical Exam Updated Vital Signs BP (!) 114/59 (BP Location: Right Arm)   Pulse 85   Temp 98.9 F (37.2 C) (Oral)   Resp (!) 23   Ht 5\' 6"  (1.676 m)   Wt 104.3 kg   LMP 05/28/2019   SpO2 99%   BMI 37.12 kg/m   Physical Exam Vitals signs and nursing note reviewed.  Constitutional:      General: She is not in acute distress.    Appearance: She is well-developed.     Comments: Non-toxic appearing.  HENT:     Head: Normocephalic and atraumatic.  Neck:     Musculoskeletal: Neck supple.  Cardiovascular:     Rate and Rhythm: Normal rate and regular rhythm.     Heart sounds: Normal heart sounds. No murmur.  Pulmonary:     Effort: Pulmonary effort is normal. No respiratory distress.     Breath sounds: Normal breath sounds.     Comments: Lungs are clear to auscultation bilaterally. Abdominal:     General: There is no distension.     Palpations: Abdomen is soft.     Comments: No abdominal tenderness.  Skin:    General: Skin is warm and dry.  Neurological:     Mental Status: She is alert and oriented to person,  place, and time.      ED Treatments / Results  Labs (all labs ordered are listed, but only abnormal results are displayed) Labs Reviewed  CBC WITH DIFFERENTIAL/PLATELET - Abnormal; Notable for the following components:      Result Value   WBC 10.6 (*)    All other components within normal limits  URINALYSIS, ROUTINE W REFLEX MICROSCOPIC - Abnormal; Notable for the following components:   APPearance HAZY (*)    Hgb urine dipstick LARGE (*)    Bacteria, UA FEW (*)    All other components within normal limits  COMPREHENSIVE METABOLIC PANEL  POC URINE PREG, ED    EKG None  Radiology Dg Chest Portable 1  View  Result Date: 05/28/2019 CLINICAL DATA:  18 year old female with lightheadedness. History of asthma. EXAM: PORTABLE CHEST 1 VIEW COMPARISON:  Chest radiograph dated 01/08/2019 FINDINGS: The heart size and mediastinal contours are within normal limits. Both lungs are clear. The visualized skeletal structures are unremarkable. IMPRESSION: No active disease. Electronically Signed   By: Elgie CollardArash  Radparvar M.D.   On: 05/28/2019 18:31    Procedures Procedures (including critical care time)  Medications Ordered in ED Medications  sodium chloride 0.9 % bolus 1,000 mL (0 mLs Intravenous Stopped 05/28/19 2101)     Initial Impression / Assessment and Plan / ED Course  I have reviewed the triage vital signs and the nursing notes.  Pertinent labs & imaging results that were available during my care of the patient were reviewed by me and considered in my medical decision making (see chart for details).       Levenia Fodor is a 18 y.o. female who presents to ED for lightheadedness and generalized weakness since last night.  On exam, patient is afebrile, hemodynamically stable with clear lung exam and benign abdominal exam.  Work-up reassuring.  UA does have large amount of blood, but no signs of infection.  She currently is on her menstrual cycle. Evaluation does not show pathology that  would require ongoing emergent intervention or inpatient treatment.  PCP follow-up encouraged if she is not feeling better in the next couple of days.  Reasons to return to ER were discussed and all questions were answered.   Final Clinical Impressions(s) / ED Diagnoses   Final diagnoses:  Weakness    ED Discharge Orders    None       Danee Soller, Chase PicketJaime Pilcher, PA-C 05/28/19 2119    Melene PlanFloyd, Dan, DO 05/28/19 2203

## 2019-05-28 NOTE — Discharge Instructions (Signed)
It was my pleasure taking care of you today!   Follow up with your primary care doctor if you are not feeling better in the next day or two.   Return to ER for new or worsening symptoms, any additional concerns.

## 2019-06-05 ENCOUNTER — Encounter (HOSPITAL_COMMUNITY): Payer: Self-pay

## 2019-06-05 ENCOUNTER — Emergency Department (HOSPITAL_COMMUNITY): Payer: Medicaid Other

## 2019-06-05 ENCOUNTER — Other Ambulatory Visit: Payer: Self-pay

## 2019-06-05 ENCOUNTER — Emergency Department (HOSPITAL_COMMUNITY)
Admission: EM | Admit: 2019-06-05 | Discharge: 2019-06-05 | Disposition: A | Discharge status: Home / Self Care | Payer: Medicaid Other | Attending: Emergency Medicine | Admitting: Emergency Medicine

## 2019-06-05 DIAGNOSIS — F840 Autistic disorder: Secondary | ICD-10-CM | POA: Insufficient documentation

## 2019-06-05 DIAGNOSIS — J45909 Unspecified asthma, uncomplicated: Secondary | ICD-10-CM | POA: Insufficient documentation

## 2019-06-05 DIAGNOSIS — Z79899 Other long term (current) drug therapy: Secondary | ICD-10-CM | POA: Insufficient documentation

## 2019-06-05 DIAGNOSIS — R079 Chest pain, unspecified: Secondary | ICD-10-CM | POA: Diagnosis not present

## 2019-06-05 LAB — CBC
HCT: 36.8 % (ref 36.0–46.0)
Hemoglobin: 12.1 g/dL (ref 12.0–15.0)
MCH: 28.7 pg (ref 26.0–34.0)
MCHC: 32.9 g/dL (ref 30.0–36.0)
MCV: 87.2 fL (ref 80.0–100.0)
Platelets: 300 10*3/uL (ref 150–400)
RBC: 4.22 MIL/uL (ref 3.87–5.11)
RDW: 13.2 % (ref 11.5–15.5)
WBC: 12.6 10*3/uL — ABNORMAL HIGH (ref 4.0–10.5)
nRBC: 0 % (ref 0.0–0.2)

## 2019-06-05 LAB — BASIC METABOLIC PANEL
Anion gap: 6 (ref 5–15)
BUN: 11 mg/dL (ref 6–20)
CO2: 25 mmol/L (ref 22–32)
Calcium: 9.7 mg/dL (ref 8.9–10.3)
Chloride: 108 mmol/L (ref 98–111)
Creatinine, Ser: 0.89 mg/dL (ref 0.44–1.00)
GFR calc Af Amer: >60 mL/min (ref >60)
GFR calc non Af Amer: >60 mL/min (ref >60)
Glucose, Bld: 100 mg/dL — ABNORMAL HIGH (ref 70–99)
Potassium: 4 mmol/L (ref 3.5–5.1)
Sodium: 139 mmol/L (ref 135–145)

## 2019-06-05 LAB — I-STAT BETA HCG BLOOD, ED (NOT ORDERABLE): I-stat hCG, quantitative: <5 m[IU]/mL (ref <5)

## 2019-06-05 LAB — TROPONIN I (HIGH SENSITIVITY): Troponin I (High Sensitivity): <2 ng/L (ref <18)

## 2019-06-05 NOTE — Discharge Instructions (Signed)
Cardiac work-up here is reassuring.   Please follow-up with your primary care doctor. Return here for any new/acute changes.

## 2019-06-05 NOTE — ED Triage Notes (Signed)
EMS reports stabbing chest pain and numbness and tingling down her L arm. EMS states that she was hyperventilating upon their arrival, but was able to be coached out of it. EKG unremarkable with EMS. VSS.

## 2019-06-05 NOTE — ED Provider Notes (Signed)
[facility]-EMERGENCY DEPT Provider Note   CSN: [id number] Arrival date & time: 06/05/19  0251     History   Chief Complaint No chief complaint on file.   HPI Amanda [name] is a 18 y.o. female.     The history is provided by the patient and medical records.     18 year old female with history of asthma, ADHD, depression, oppositional defiant disorder, mood disorder, eczema, presenting to the ED with chest pain.  Reports this began this evening.  States it feels like sharp, stabbing pains in the left side of her chest.  States initially she felt short of breath, however that is resolved at this time.  She did report some tingling in her left arm.  Upon EMS arrival she was hyperventilating and they were able to coach her with this and return breathing to normal.  She denies any known prior cardiac history.  She has no history of DVT or PE.  She denies any recent added stress or feeling anxious about anything in particular.  Past Medical History:  Diagnosis Date  . Asthma   . Attention deficit hyperactivity disorder   . Depression   . Eczema   . Mood disorder (HCC)   . Oppositional defiant disorder     Patient Active Problem List   Diagnosis Date Noted  . DMDD (disruptive mood dysregulation disorder) (HCC) 01/14/2014  . PTSD (post-traumatic stress disorder) 01/14/2014  . Deliberate self-cutting 01/14/2014  . Autism spectrum 01/14/2014  . ADHD (attention deficit hyperactivity disorder) 01/14/2014  . Aggression 01/14/2014  . Suicidal ideation 01/14/2014  . ODD (oppositional defiant disorder) 01/13/2014  . Oppositional defiant disorder 11/04/2013    Past Surgical History:  Procedure Laterality Date  . ADENOIDECTOMY    . TYMPANOSTOMY TUBE PLACEMENT       OB History   No obstetric history on file.      Home Medications    Prior to Admission medications   Medication Sig Start Date End Date Taking? Authorizing Provider  ADDERALL XR 20 MG 24 hr  capsule Take 20 mg by mouth daily after lunch. 02/22/18  Yes [provider]  albuterol (PROVENTIL HFA;VENTOLIN HFA) 108 (90 Base) MCG/ACT inhaler Inhale 1-2 puffs into the lungs every 6 (six) hours as needed for wheezing or shortness of breath.   Yes [provider]  citalopram (CELEXA) 20 MG tablet Take 1 tablet (20 mg total) by mouth daily. Patient taking differently: Take 30 mg by mouth daily.  01/16/14  Yes Winson, Louie BunKim B, NP  cloZAPine (CLOZARIL) 100 MG tablet Take 200 mg by mouth every evening.    Yes [provider]  clozapine (CLOZARIL) 50 MG tablet Take 50 mg by mouth every morning.    Yes [provider]  fluticasone (FLONASE) 50 MCG/ACT nasal spray Place 2 sprays into both nostrils daily. 12/15/18  Yes Loren RacerYelverton, David, MD  folic acid (FOLVITE) 1 MG tablet Take 1 mg by mouth daily. 02/15/18  Yes [provider]  levothyroxine (SYNTHROID, LEVOTHROID) 50 MCG tablet Take 50 mcg by mouth daily before breakfast.   Yes [provider]  lithium carbonate (ESKALITH) 450 MG CR tablet Take 1 tablet (450 mg total) by mouth 2 (two) times daily. Patient taking differently: Take 300 mg by mouth 3 (three) times daily.  01/16/14  Yes Winson, Louie BunKim B, NP  montelukast (SINGULAIR) 10 MG tablet Take 10 mg by mouth at bedtime.   Yes [provider]  VYVANSE 70 MG capsule Take 70  mg by mouth every morning. 02/16/18  Yes [provider]  bisacodyl (DULCOLAX) 5 MG EC tablet Take 1 tablet (5 mg total) by mouth 2 (two) times daily. Patient not taking: Reported on 05/28/2019 08/21/18   Fayrene Helper, PA-C  dicyclomine (BENTYL) 20 MG tablet Take 1 tablet (20 mg total) by mouth every 12 (twelve) hours as needed (abdominal pain/cramping). Patient not taking: Reported on 06/05/2019 04/06/18   Antony Madura, PA-C  loratadine (CLARITIN) 10 MG tablet Take 1 tablet (10 mg total) by mouth daily. Patient not taking: Reported on 05/28/2019 12/15/18   Loren Racer, MD   ondansetron (ZOFRAN) 4 MG tablet Take 1 tablet (4 mg total) by mouth every 6 (six) hours as needed for nausea or vomiting. Patient not taking: Reported on 06/05/2019 12/15/18   Loren Racer, MD  polyethylene glycol [facility] Ethelene Hal) packet Take 17 g by mouth daily. Patient not taking: Reported on 05/28/2019 08/21/18   Fayrene Helper, PA-C    Family History History reviewed. No pertinent family history.  Social History Social History   Tobacco Use  . Smoking status: Never Smoker  . Smokeless tobacco: Never Used  Substance Use Topics  . Alcohol use: No  . Drug use: No     Allergies   Poison ivy extract [poison ivy extract] and Sulfa antibiotics   Review of Systems Review of Systems  Cardiovascular: Positive for chest pain.  All other systems reviewed and are negative.    Physical Exam Updated Vital Signs BP (!) 136/105 (BP Location: Left Arm)   Pulse 74   Temp 98.6 F (37 C) (Oral)   Resp 16   Ht 5\' 5"  (1.651 m)   LMP 05/28/2019   SpO2 96%   BMI 38.27 kg/m   Physical Exam Vitals signs and nursing note reviewed.  Constitutional:      Appearance: She is well-developed.     Comments: Watching TV, NAD  HENT:     Head: Normocephalic and atraumatic.  Eyes:     Conjunctiva/sclera: Conjunctivae normal.     Pupils: Pupils are equal, round, and reactive to light.  Neck:     Musculoskeletal: Normal range of motion.  Cardiovascular:     Rate and Rhythm: Normal rate and regular rhythm.     Heart sounds: Normal heart sounds.  Pulmonary:     Effort: Pulmonary effort is normal.     Breath sounds: Normal breath sounds. No wheezing or rhonchi.     Comments: Lungs clear, no distress Chest:     Comments: Slight tenderness of the left upper chest wall, no deformity or signs of trauma Abdominal:     General: Bowel sounds are normal.     Palpations: Abdomen is soft.  Musculoskeletal: Normal range of motion.  Skin:    General: Skin is warm and dry.  Neurological:      Mental Status: She is alert and oriented to person, place, and time.      ED Treatments / Results  Labs (all labs ordered are listed, but only abnormal results are displayed) Labs Reviewed  BASIC METABOLIC PANEL - Abnormal; Notable for the following components:      Result Value   Glucose, Bld 100 (*)    All other components within normal limits  CBC - Abnormal; Notable for the following components:   WBC 12.6 (*)    All other components within normal limits  I-STAT BETA HCG BLOOD, ED ([facility], [facility], [facility] ONLY)  I-STAT BETA HCG BLOOD, ED (NOT ORDERABLE)  TROPONIN I (HIGH SENSITIVITY)    EKG None  Radiology Dg Chest 2 View  Result Date: 06/05/2019 CLINICAL DATA:  Mid chest pain EXAM: CHEST - 2 VIEW COMPARISON:  05/28/2019 FINDINGS: The heart size and mediastinal contours are within normal limits. Both lungs are clear. The visualized skeletal structures are unremarkable. IMPRESSION: No active cardiopulmonary disease. Electronically Signed   By: Inez Catalina M.D.   On: 06/05/2019 03:42    Procedures Procedures (including critical care time)  Medications Ordered in ED Medications - No data to display   Initial Impression / Assessment and Plan / ED Course  I have reviewed the triage vital signs and the nursing notes.  Pertinent labs & imaging results that were available during my care of the patient were reviewed by me and considered in my medical decision making (see chart for details).  18 year old female presenting to the ED with chest pain.  States it feels like sharp, stabbing pain in the left side of her chest and some tingling of the left arm.  She was apparently hyperventilating upon EMS arrival but was able to be coaxed out of this and breathing returned to normal.  On exam here she is afebrile and nontoxic.  Her vitals are stable.  She has minimal left upper chest wall tenderness without swelling, deformity, or signs of trauma.  Her lungs are clear without any wheezes or  rhonchi.  EKG is nonischemic.  Labs are reassuring.  Chest x-ray is clear.  Patient has been sleeping for the past half hour or so while waiting for lab results.  Once awoken, states she is feeling better at this time.  Low suspicion for ACS, PE, dissection, the acute cardiac event.  Feel she is stable for discharge home.  Have recommended close follow-up with PCP.  Return here for any new or acute changes.  Final Clinical Impressions(s) / ED Diagnoses   Final diagnoses:  Chest pain in adult    ED Discharge Orders    None       Larene Pickett, PA-C 06/05/19 0541    Mesner, Corene Cornea, MD 06/05/19 [phone]

## 2019-08-01 ENCOUNTER — Encounter (HOSPITAL_COMMUNITY): Payer: Self-pay

## 2019-08-01 ENCOUNTER — Emergency Department (HOSPITAL_COMMUNITY)
Admission: EM | Admit: 2019-08-01 | Discharge: 2019-08-02 | Disposition: A | Discharge status: Home / Self Care | Payer: Medicaid Other | Attending: Emergency Medicine | Admitting: Emergency Medicine

## 2019-08-01 DIAGNOSIS — Z79899 Other long term (current) drug therapy: Secondary | ICD-10-CM | POA: Insufficient documentation

## 2019-08-01 DIAGNOSIS — J45909 Unspecified asthma, uncomplicated: Secondary | ICD-10-CM | POA: Insufficient documentation

## 2019-08-01 DIAGNOSIS — F419 Anxiety disorder, unspecified: Secondary | ICD-10-CM | POA: Insufficient documentation

## 2019-08-01 DIAGNOSIS — F840 Autistic disorder: Secondary | ICD-10-CM | POA: Insufficient documentation

## 2019-08-01 MED ORDER — CITALOPRAM HYDROBROMIDE 10 MG PO TABS
30.0000 mg | ORAL_TABLET | Freq: Every day | ORAL | Status: DC
  Administered 2019-08-01: 30 mg via ORAL
  Filled 2019-08-01: qty 3

## 2019-08-01 MED ORDER — CLOZAPINE 100 MG PO TABS: 200.0000 mg | ORAL_TABLET | Freq: Every evening | ORAL | Status: DC

## 2019-08-01 MED ORDER — LITHIUM CARBONATE ER 300 MG PO TBCR
300.0000 mg | EXTENDED_RELEASE_TABLET | Freq: Three times a day (TID) | ORAL | Status: DC
  Administered 2019-08-01: 300 mg via ORAL
  Filled 2019-08-01: qty 1

## 2019-08-01 MED ORDER — AMMONIA AROMATIC IN INHA
RESPIRATORY_TRACT | Status: AC
  Filled 2019-08-01: qty 10

## 2019-08-01 MED ORDER — TRAZODONE HCL 100 MG PO TABS
300.0000 mg | ORAL_TABLET | Freq: Once | ORAL | Status: AC
  Administered 2019-08-01: 300 mg via ORAL
  Filled 2019-08-01: qty 3

## 2019-08-01 NOTE — ED Triage Notes (Signed)
Pt BIB [facility] from home c/o anxiety attack. She states that she hasn't had any medications in a week.  142/80, 99% RA, 64 HR, afebrile CBG 118

## 2019-08-01 NOTE — Progress Notes (Signed)
CSW met with the pt who stated she did not have the # for her [facility] so CSW contacted the ACTT Team in the pt's presence and with the permission of the pt.  Pt contacted her mother who is now her LG, per the pt and the pt's mother and they spoke with the CSW by speaker phone simultaneously.  CSW and ACTT Team crisis worker who is pt's contact on the [facility] confirmed they would bring pt's meds tomorrow (11/12) and voiced understanding that pt's EDP is providing pt with tonights dose with the understanding that pt's ACTT team would provide meds tomorrow (11/12).  Pt's ACTT team worker stated pt was to call when returning from her out of town trip to say she was home to receive her new round of weekly meds on 11/12 and did not call to say she was out of meds early as she and her mother understood that the pt could do, if necessary.  Pt and pt's mother voiced understanding of the cause of the current dilemma and the solution going forwards, CSW provided pt with ACTT team crisis # and pt's mother stated she would be en route shortly to p/u pt.    EDP/RN updated.  Please reconsult if future social work needs arise.  CSW signing off, as social work intervention is no longer needed.  Alphonse Guild. Shavette Shoaff, LCSW, LCAS, CSI Transitions of Care Clinical Social Worker Care Coordination Department Ph: [phone]

## 2019-08-01 NOTE — ED Notes (Signed)
Pt states that she spoke with her mother who is her legal guardian.

## 2019-08-01 NOTE — Discharge Instructions (Addendum)
Call the ACT team tomorrow to get your medications

## 2019-08-01 NOTE — ED Provider Notes (Signed)
[facility]-EMERGENCY DEPT Provider Note   CSN: [id number] Arrival date & time: 08/01/19  2127     History   Chief Complaint Chief Complaint  Patient presents with  . Anxiety    HPI Amanda [name] is a 18 y.o. female.     Pt reports she is followed by the ACT team.  Pt reports they normally meet with her every month and bring her her medications.  Pt reports she is out of her medicaiton   The history is provided by the patient. No language interpreter was used.  Anxiety This is a new problem. The problem occurs constantly. The problem has been gradually worsening. Nothing aggravates the symptoms. Nothing relieves the symptoms. She has tried nothing for the symptoms.    Past Medical History:  Diagnosis Date  . Asthma   . Attention deficit hyperactivity disorder   . Depression   . Eczema   . Mood disorder (HCC)   . Oppositional defiant disorder     Patient Active Problem List   Diagnosis Date Noted  . DMDD (disruptive mood dysregulation disorder) (HCC) 01/14/2014  . PTSD (post-traumatic stress disorder) 01/14/2014  . Deliberate self-cutting 01/14/2014  . Autism spectrum 01/14/2014  . ADHD (attention deficit hyperactivity disorder) 01/14/2014  . Aggression 01/14/2014  . Suicidal ideation 01/14/2014  . ODD (oppositional defiant disorder) 01/13/2014  . Oppositional defiant disorder 11/04/2013    Past Surgical History:  Procedure Laterality Date  . ADENOIDECTOMY    . TYMPANOSTOMY TUBE PLACEMENT       OB History   No obstetric history on file.      Home Medications    Prior to Admission medications   Medication Sig Start Date End Date Taking? Authorizing Provider  albuterol (PROVENTIL HFA;VENTOLIN HFA) 108 (90 Base) MCG/ACT inhaler Inhale 1-2 puffs into the lungs every 6 (six) hours as needed for wheezing or shortness of breath.   Yes [provider]  citalopram (CELEXA) 20 MG tablet Take 1 tablet (20 mg total) by mouth daily.  Patient taking differently: Take 30 mg by mouth daily.  01/16/14  Yes Winson, Louie Bun, NP  cloZAPine (CLOZARIL) 100 MG tablet Take 200 mg by mouth every evening.    Yes [provider]  clozapine (CLOZARIL) 50 MG tablet Take 50 mg by mouth every morning.    Yes [provider]  hydrOXYzine (VISTARIL) 25 MG capsule Take 25 mg by mouth 3 (three) times daily as needed for anxiety.   Yes [provider]  levothyroxine (SYNTHROID, LEVOTHROID) 50 MCG tablet Take 50 mcg by mouth daily before breakfast.   Yes [provider]  lithium carbonate (ESKALITH) 450 MG CR tablet Take 1 tablet (450 mg total) by mouth 2 (two) times daily. Patient taking differently: Take 300 mg by mouth 3 (three) times daily.  01/16/14  Yes Winson, Louie Bun, NP  montelukast (SINGULAIR) 10 MG tablet Take 10 mg by mouth at bedtime.   Yes [provider]  pantoprazole (PROTONIX) 40 MG tablet Take 40 mg by mouth daily.   Yes [provider]  trazodone (DESYREL) 300 MG tablet Take 300 mg by mouth at bedtime. 06/27/19  Yes [provider]  dicyclomine (BENTYL) 20 MG tablet Take 1 tablet (20 mg total) by mouth every 12 (twelve) hours as needed (abdominal pain/cramping). Patient not taking: Reported on 06/05/2019 04/06/18 08/01/19  Antony Madura, PA-C  fluticasone [facility]) 50 MCG/ACT nasal spray Place 2 sprays into both nostrils daily. Patient not taking: Reported on  08/01/2019 12/15/18 08/01/19  Julianne Rice, MD  loratadine (CLARITIN) 10 MG tablet Take 1 tablet (10 mg total) by mouth daily. Patient not taking: Reported on 05/28/2019 12/15/18 08/01/19  Julianne Rice, MD    Family History History reviewed. No pertinent family history.  Social History Social History   Tobacco Use  . Smoking status: Never Smoker  . Smokeless tobacco: Never Used  Substance Use Topics  . Alcohol use: No  . Drug use: No     Allergies   Poison ivy extract [poison ivy extract] and Sulfa  antibiotics   Review of Systems Review of Systems  All other systems reviewed and are negative.    Physical Exam Updated Vital Signs BP (!) 124/93 (BP Location: Left Arm)   Pulse 67   Temp 98.4 F (36.9 C) (Oral)   Resp 20   SpO2 98%   Physical Exam Vitals signs and nursing note reviewed.  Constitutional:      Appearance: She is well-developed.  HENT:     Head: Normocephalic.  Neck:     Musculoskeletal: Normal range of motion.  Cardiovascular:     Rate and Rhythm: Normal rate and regular rhythm.  Pulmonary:     Effort: Pulmonary effort is normal.  Abdominal:     General: There is no distension.  Musculoskeletal: Normal range of motion.  Skin:    General: Skin is warm.  Neurological:     General: No focal deficit present.     Mental Status: She is alert and oriented to person, place, and time.      ED Treatments / Results  Labs (all labs ordered are listed, but only abnormal results are displayed) Labs Reviewed - No data to display  EKG None  Radiology No results found.  Procedures Procedures (including critical care time)  Medications Ordered in ED Medications  ammonia inhalant (has no administration in time range)     Initial Impression / Assessment and Plan / ED Course  I have reviewed the triage vital signs and the nursing notes.  Pertinent labs & imaging results that were available during my care of the patient were reviewed by me and considered in my medical decision making (see chart for details).        Social work will consult.  Act team notified and will help pt get her medications  Pt given her evening medications here   Final Clinical Impressions(s) / ED Diagnoses   Final diagnoses:  Anxiety    ED Discharge Orders    None       Sidney Ace 08/01/19 2349    Daleen Bo, MD 08/01/19 2350

## 2019-08-02 NOTE — ED Notes (Signed)
Pt refused dc vitals. Ambulated out of the ED in no distress. Mother (legal guardian) en route to pick up patient. Contacts by social work.

## 2019-10-28 ENCOUNTER — Emergency Department (HOSPITAL_COMMUNITY)
Admission: EM | Admit: 2019-10-28 | Discharge: 2019-10-28 | Disposition: A | Discharge status: Home / Self Care | Payer: Medicaid Other | Attending: Emergency Medicine | Admitting: Emergency Medicine

## 2019-10-28 ENCOUNTER — Encounter (HOSPITAL_COMMUNITY): Payer: Self-pay | Admitting: Emergency Medicine

## 2019-10-28 ENCOUNTER — Other Ambulatory Visit: Payer: Self-pay

## 2019-10-28 ENCOUNTER — Emergency Department (HOSPITAL_COMMUNITY): Payer: Medicaid Other

## 2019-10-28 DIAGNOSIS — F4310 Post-traumatic stress disorder, unspecified: Secondary | ICD-10-CM | POA: Insufficient documentation

## 2019-10-28 DIAGNOSIS — F3489 Other specified persistent mood disorders: Secondary | ICD-10-CM | POA: Insufficient documentation

## 2019-10-28 DIAGNOSIS — Z79899 Other long term (current) drug therapy: Secondary | ICD-10-CM | POA: Diagnosis not present

## 2019-10-28 DIAGNOSIS — Z9114 Patient's other noncompliance with medication regimen: Secondary | ICD-10-CM | POA: Diagnosis not present

## 2019-10-28 DIAGNOSIS — F419 Anxiety disorder, unspecified: Secondary | ICD-10-CM | POA: Diagnosis present

## 2019-10-28 DIAGNOSIS — J45909 Unspecified asthma, uncomplicated: Secondary | ICD-10-CM | POA: Diagnosis not present

## 2019-10-28 DIAGNOSIS — F840 Autistic disorder: Secondary | ICD-10-CM | POA: Diagnosis not present

## 2019-10-28 DIAGNOSIS — Z20822 Contact with and (suspected) exposure to covid-19: Secondary | ICD-10-CM | POA: Diagnosis not present

## 2019-10-28 DIAGNOSIS — F913 Oppositional defiant disorder: Secondary | ICD-10-CM | POA: Insufficient documentation

## 2019-10-28 DIAGNOSIS — Z008 Encounter for other general examination: Secondary | ICD-10-CM | POA: Insufficient documentation

## 2019-10-28 DIAGNOSIS — R519 Headache, unspecified: Secondary | ICD-10-CM | POA: Diagnosis not present

## 2019-10-28 LAB — CBG MONITORING, ED: Glucose-Capillary: 90 mg/dL (ref 70–99)

## 2019-10-28 LAB — CBC WITH DIFFERENTIAL/PLATELET
Abs Immature Granulocytes: 0.04 10*3/uL (ref 0.00–0.07)
Basophils Absolute: 0.1 10*3/uL (ref 0.0–0.1)
Basophils Relative: 0 %
Eosinophils Absolute: 0 10*3/uL (ref 0.0–0.5)
Eosinophils Relative: 0 %
HCT: 38.9 % (ref 36.0–46.0)
Hemoglobin: 13.1 g/dL (ref 12.0–15.0)
Immature Granulocytes: 0 %
Lymphocytes Relative: 37 %
Lymphs Abs: 4.4 10*3/uL — ABNORMAL HIGH (ref 0.7–4.0)
MCH: 28.4 pg (ref 26.0–34.0)
MCHC: 33.7 g/dL (ref 30.0–36.0)
MCV: 84.4 fL (ref 80.0–100.0)
Monocytes Absolute: 1 10*3/uL (ref 0.1–1.0)
Monocytes Relative: 8 %
Neutro Abs: 6.3 10*3/uL (ref 1.7–7.7)
Neutrophils Relative %: 55 %
Platelets: 304 10*3/uL (ref 150–400)
RBC: 4.61 MIL/uL (ref 3.87–5.11)
RDW: 13.1 % (ref 11.5–15.5)
WBC: 11.7 10*3/uL — ABNORMAL HIGH (ref 4.0–10.5)
nRBC: 0 % (ref 0.0–0.2)

## 2019-10-28 LAB — ACETAMINOPHEN LEVEL: Acetaminophen (Tylenol), Serum: <10 ug/mL — ABNORMAL LOW (ref 10–30)

## 2019-10-28 LAB — COMPREHENSIVE METABOLIC PANEL
ALT: 18 U/L (ref 0–44)
AST: 19 U/L (ref 15–41)
Albumin: 4.5 g/dL (ref 3.5–5.0)
Alkaline Phosphatase: 76 U/L (ref 38–126)
Anion gap: 8 (ref 5–15)
BUN: 13 mg/dL (ref 6–20)
CO2: 21 mmol/L — ABNORMAL LOW (ref 22–32)
Calcium: 9.6 mg/dL (ref 8.9–10.3)
Chloride: 108 mmol/L (ref 98–111)
Creatinine, Ser: 0.84 mg/dL (ref 0.44–1.00)
GFR calc Af Amer: >60 mL/min (ref >60)
GFR calc non Af Amer: >60 mL/min (ref >60)
Glucose, Bld: 100 mg/dL — ABNORMAL HIGH (ref 70–99)
Potassium: 3.5 mmol/L (ref 3.5–5.1)
Sodium: 137 mmol/L (ref 135–145)
Total Bilirubin: 1.1 mg/dL (ref 0.3–1.2)
Total Protein: 7.8 g/dL (ref 6.5–8.1)

## 2019-10-28 LAB — ETHANOL: Alcohol, Ethyl (B): <10 mg/dL (ref <10)

## 2019-10-28 LAB — RESPIRATORY PANEL BY RT PCR (FLU A&B, COVID)
Influenza A by PCR: NEGATIVE
Influenza B by PCR: NEGATIVE
SARS Coronavirus 2 by RT PCR: NEGATIVE

## 2019-10-28 LAB — I-STAT BETA HCG BLOOD, ED (MC, WL, AP ONLY): I-stat hCG, quantitative: <5 m[IU]/mL (ref <5)

## 2019-10-28 LAB — SALICYLATE LEVEL: Salicylate Lvl: <7 mg/dL — ABNORMAL LOW (ref 7.0–30.0)

## 2019-10-28 MED ORDER — ONDANSETRON HCL 4 MG PO TABS
4.0000 mg | ORAL_TABLET | Freq: Three times a day (TID) | ORAL | Status: DC | PRN
  Administered 2019-10-28: 08:00:00 4 mg via ORAL
  Filled 2019-10-28: qty 1

## 2019-10-28 MED ORDER — ACETAMINOPHEN 325 MG PO TABS: 650.0000 mg | ORAL_TABLET | ORAL | Status: DC | PRN

## 2019-10-28 MED ORDER — ALUM & MAG HYDROXIDE-SIMETH 200-200-20 MG/5ML PO SUSP
30.0000 mL | Freq: Four times a day (QID) | ORAL | Status: DC | PRN
  Administered 2019-10-28: 30 mL via ORAL
  Filled 2019-10-28: qty 30

## 2019-10-28 NOTE — Discharge Instructions (Addendum)
Please return for any problem. Follow up with your regular care provider.  °

## 2019-10-28 NOTE — ED Provider Notes (Addendum)
Attempted to contact legal guardian, mother.  Left message to call back.  Have not heard back.    Informed patient wants breasts examined due to concerns, will refer to the breast center and community health and wellness.     Kealy Lewter, MD 10/28/19 Lear Ng, Harlen Danford, MD 10/28/19 2951

## 2019-10-28 NOTE — ED Triage Notes (Signed)
Pt comes to ed via ems, c/o chest and headache, started 2 days ago, pt non compliant on medications, and stop taking her meds. Pt states she is having anxiety and panic attacks. V/s 142/90, rr18, pluse 92, spo2 98 , temp 98.4.  Pt has a legal guardian:

## 2019-10-28 NOTE — ED Notes (Signed)
Pt's mother Leafy Kindle [phone]) contacted , she is pt legal guardian, to let her know pt is in the ED.

## 2019-10-28 NOTE — ED Notes (Signed)
Pt. CBG 90, RN, Hui made aware.

## 2019-10-28 NOTE — ED Notes (Signed)
Attempted to call mom no answer  During handoff informed amanda rn  To to continue to call the mother the legal guardian per provider orders.  TTS is waiting for legal guardian to be spoken with

## 2019-10-28 NOTE — Progress Notes (Signed)
TTS attempted to call mom beth mahalak [phone]) in order to obtain collateral information but did not receive an answer. Unable to leave a v/m.   Princess Bruins, MSW, LCSW Therapeutic Triage Specialist  [phone]
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
## 2019-10-28 NOTE — ED Provider Notes (Signed)
Patient's legal guardian -- her mother-- is aware of the case.  She is made aware of the patient's test results and plan of care.  She is in agreement with plan to discharge.  She does understand that the patient should be closely followed by her regular care providers.   Wynetta Fines, MD 10/28/19 [phone]

## 2019-10-28 NOTE — ED Provider Notes (Addendum)
[facility] DEPT Provider Note   CSN: [id number] Arrival date & time: 10/28/19  0147     History Chief Complaint  Patient presents with  . Panic Attack    Amanda [name] is a 19 y.o. female.  The history is provided by the patient and the EMS personnel. The history is limited by the condition of the patient.  Mental Health Problem Presenting symptoms: paranoid behavior   Presenting symptoms: no self-mutilation and no suicidal threats   Presenting symptoms comment:  Anxiety Patient accompanied by: none. Degree of incapacity (severity):  Moderate Onset quality:  Gradual Duration:  1 month Timing:  Constant Progression:  Worsening Chronicity:  Recurrent Context: noncompliance   Treatment compliance:  Untreated Time since last psychoactive medication taken:  1 month Relieved by:  Nothing Worsened by:  Nothing Ineffective treatments:  None tried Associated symptoms: anxiety and headaches   Associated symptoms: no abdominal pain, no decreased need for sleep, no euphoric mood, no irritability, no poor judgment, no school problems and no weight change   Risk factors: hx of mental illness        Past Medical History:  Diagnosis Date  . Asthma   . Attention deficit hyperactivity disorder   . Depression   . Eczema   . Mood disorder ([place])   . Oppositional defiant disorder     Patient Active Problem List   Diagnosis Date Noted  . DMDD (disruptive mood dysregulation disorder) ([place]) 01/14/2014  . PTSD (post-traumatic stress disorder) 01/14/2014  . Deliberate self-cutting 01/14/2014  . Autism spectrum 01/14/2014  . ADHD (attention deficit hyperactivity disorder) 01/14/2014  . Aggression 01/14/2014  . Suicidal ideation 01/14/2014  . ODD (oppositional defiant disorder) 01/13/2014  . Oppositional defiant disorder 11/04/2013    Past Surgical History:  Procedure Laterality Date  . ADENOIDECTOMY    . TYMPANOSTOMY TUBE PLACEMENT       OB  History   No obstetric history on file.     History reviewed. No pertinent family history.  Social History   Tobacco Use  . Smoking status: Never Smoker  . Smokeless tobacco: Never Used  Substance Use Topics  . Alcohol use: No  . Drug use: No    Home Medications Prior to Admission medications   Medication Sig Start Date End Date Taking? Authorizing Provider  albuterol (PROVENTIL HFA;VENTOLIN HFA) 108 (90 Base) MCG/ACT inhaler Inhale 1-2 puffs into the lungs every 6 (six) hours as needed for wheezing or shortness of breath.    [provider]  citalopram (CELEXA) 20 MG tablet Take 1 tablet (20 mg total) by mouth daily. Patient taking differently: Take 30 mg by mouth daily.  01/16/14   Winson, Manus Rudd, NP  cloZAPine (CLOZARIL) 100 MG tablet Take 200 mg by mouth every evening.     [provider]  clozapine (CLOZARIL) 50 MG tablet Take 50 mg by mouth every morning.     [provider]  hydrOXYzine (VISTARIL) 25 MG capsule Take 25 mg by mouth 3 (three) times daily as needed for anxiety.    [provider]  levothyroxine (SYNTHROID, LEVOTHROID) 50 MCG tablet Take 50 mcg by mouth daily before breakfast.    [provider]  lithium carbonate (ESKALITH) 450 MG CR tablet Take 1 tablet (450 mg total) by mouth 2 (two) times daily. Patient taking differently: Take 300 mg by mouth 3 (three) times daily.  01/16/14   Winson, Manus Rudd, NP  montelukast (SINGULAIR) 10 MG tablet Take 10 mg by  mouth at bedtime.    [provider]  pantoprazole (PROTONIX) 40 MG tablet Take 40 mg by mouth daily.    [provider]  trazodone (DESYREL) 300 MG tablet Take 300 mg by mouth at bedtime. 06/27/19   [provider]  dicyclomine (BENTYL) 20 MG tablet Take 1 tablet (20 mg total) by mouth every 12 (twelve) hours as needed (abdominal pain/cramping). Patient not taking: Reported on 06/05/2019 04/06/18 08/01/19  Antony Madura, PA-C  fluticasone [facility]) 50  MCG/ACT nasal spray Place 2 sprays into both nostrils daily. Patient not taking: Reported on 08/01/2019 12/15/18 08/01/19  Loren Racer, MD  loratadine (CLARITIN) 10 MG tablet Take 1 tablet (10 mg total) by mouth daily. Patient not taking: Reported on 05/28/2019 12/15/18 08/01/19  Loren Racer, MD    Allergies    Poison ivy extract [poison ivy extract] and Sulfa antibiotics  Review of Systems   Review of Systems  Constitutional: Negative for fever and irritability.  HENT: Negative for congestion.   Eyes: Negative for visual disturbance.  Respiratory: Negative for cough and shortness of breath.   Cardiovascular: Negative for palpitations and leg swelling.  Gastrointestinal: Negative for abdominal pain.  Genitourinary: Negative for difficulty urinating.  Musculoskeletal: Negative for arthralgias.  Skin: Negative for color change.  Neurological: Positive for headaches. Negative for dizziness, tremors, seizures, syncope, facial asymmetry, speech difficulty, weakness, light-headedness and numbness.  Psychiatric/Behavioral: Positive for dysphoric mood and paranoia. Negative for self-injury. The patient is nervous/anxious.   All other systems reviewed and are negative.   Physical Exam Updated Vital Signs There were no vitals taken for this visit.  Physical Exam Vitals and nursing note reviewed.  Constitutional:      General: She is not in acute distress. HENT:     Head: Normocephalic and atraumatic.     Nose: Nose normal.  Eyes:     Conjunctiva/sclera: Conjunctivae normal.     Pupils: Pupils are equal, round, and reactive to light.  Cardiovascular:     Rate and Rhythm: Normal rate and regular rhythm.     Pulses: Normal pulses.     Heart sounds: Normal heart sounds.  Pulmonary:     Effort: Pulmonary effort is normal.     Breath sounds: Normal breath sounds.  Abdominal:     General: Abdomen is flat. Bowel sounds are normal.     Tenderness: There is no abdominal tenderness.  There is no guarding or rebound.  Musculoskeletal:        General: No tenderness. Normal range of motion.     Cervical back: Normal range of motion and neck supple.     Right lower leg: No edema.     Left lower leg: No edema.     Comments: Negative Homan's sign B  Skin:    General: Skin is warm and dry.     Capillary Refill: Capillary refill takes less than 2 seconds.  Neurological:     General: No focal deficit present.     Mental Status: She is alert and oriented to person, place, and time.     Deep Tendon Reflexes: Reflexes normal.  Psychiatric:        Mood and Affect: Mood is depressed.        Thought Content: Thought content does not include homicidal or suicidal ideation.     ED Results / Procedures / Treatments   Vitals:   10/28/19 0204  BP: 114/80  Pulse: 70  Resp: 20  Temp: 98.6 F (37 C)  SpO2: 99%    Labs (all labs ordered are listed, but only abnormal results are displayed) Results for orders placed or performed during the hospital encounter of 10/28/19  Respiratory Panel by RT PCR (Flu A&B, Covid) - Nasopharyngeal Swab   Specimen: Nasopharyngeal Swab  Result Value Ref Range   SARS Coronavirus 2 by RT PCR NEGATIVE NEGATIVE   Influenza A by PCR NEGATIVE NEGATIVE   Influenza B by PCR NEGATIVE NEGATIVE  Comprehensive metabolic panel  Result Value Ref Range   Sodium 137 135 - 145 mmol/L   Potassium 3.5 3.5 - 5.1 mmol/L   Chloride 108 98 - 111 mmol/L   CO2 21 (L) 22 - 32 mmol/L   Glucose, Bld 100 (H) 70 - 99 mg/dL   BUN 13 6 - 20 mg/dL   Creatinine, Ser 8.41 0.44 - 1.00 mg/dL   Calcium 9.6 8.9 - 66.0 mg/dL   Total Protein 7.8 6.5 - 8.1 g/dL   Albumin 4.5 3.5 - 5.0 g/dL   AST 19 15 - 41 U/L   ALT 18 0 - 44 U/L   Alkaline Phosphatase 76 38 - 126 U/L   Total Bilirubin 1.1 0.3 - 1.2 mg/dL   GFR calc non Af Amer >60 >60 mL/min   GFR calc Af Amer >60 >60 mL/min   Anion gap 8 5 - 15  Salicylate level  Result Value Ref Range   Salicylate Lvl <7.0 (L) 7.0 -  30.0 mg/dL  Acetaminophen level  Result Value Ref Range   Acetaminophen (Tylenol), Serum <10 (L) 10 - 30 ug/mL  Ethanol  Result Value Ref Range   Alcohol, Ethyl (B) <10 <10 mg/dL  CBC WITH DIFFERENTIAL  Result Value Ref Range   WBC 11.7 (H) 4.0 - 10.5 K/uL   RBC 4.61 3.87 - 5.11 MIL/uL   Hemoglobin 13.1 12.0 - 15.0 g/dL   HCT 63.0 16.0 - 10.9 %   MCV 84.4 80.0 - 100.0 fL   MCH 28.4 26.0 - 34.0 pg   MCHC 33.7 30.0 - 36.0 g/dL   RDW 32.3 55.7 - 32.2 %   Platelets 304 150 - 400 K/uL   nRBC 0.0 0.0 - 0.2 %   Neutrophils Relative % 55 %   Neutro Abs 6.3 1.7 - 7.7 K/uL   Lymphocytes Relative 37 %   Lymphs Abs 4.4 (H) 0.7 - 4.0 K/uL   Monocytes Relative 8 %   Monocytes Absolute 1.0 0.1 - 1.0 K/uL   Eosinophils Relative 0 %   Eosinophils Absolute 0.0 0.0 - 0.5 K/uL   Basophils Relative 0 %   Basophils Absolute 0.1 0.0 - 0.1 K/uL   Immature Granulocytes 0 %   Abs Immature Granulocytes 0.04 0.00 - 0.07 K/uL  CBG monitoring, ED  Result Value Ref Range   Glucose-Capillary 90 70 - 99 mg/dL   Comment 1 Notify RN   I-Stat beta hCG blood, ED  Result Value Ref Range   I-stat hCG, quantitative <5.0 <5 mIU/mL   Comment 3           CT Head Wo Contrast  Result Date: 10/28/2019 CLINICAL DATA:  Headache EXAM: CT HEAD WITHOUT CONTRAST TECHNIQUE: Contiguous axial images were obtained from the base of the skull through the vertex without intravenous contrast. COMPARISON:  None. FINDINGS: Brain: No evidence of acute territorial infarction, hemorrhage, hydrocephalus,extra-axial collection or mass lesion/mass effect. Normal gray-white differentiation. Ventricles are normal in size and contour. Vascular: No hyperdense vessel or unexpected calcification. Skull: The skull is intact.  No fracture or focal lesion identified. Sinuses/Orbits: The visualized paranasal sinuses and mastoid air cells are clear. The orbits and globes intact. Other: None IMPRESSION: No acute intracranial abnormality. Electronically  Signed   By: Jonna Clark M.D.   On: 10/28/2019 03:24    EKG  EKG Interpretation  Date/Time:  Sunday October 28 2019 02:07:15 EST Ventricular Rate:  69 PR Interval:    QRS Duration: 94 QT Interval:  373 QTC Calculation: 400 R Axis:   59 Text Interpretation: Sinus rhythm Confirmed by Nicanor Alcon, Kerissa Coia ([id number]) on 10/28/2019 2:09:50 AM       Radiology No results found.  Procedures Procedures (including critical care time)   ED Course  I have reviewed the triage vital signs and the nursing notes.  Pertinent labs & imaging results that were available during my care of the patient were reviewed by me and considered in my medical decision making (see chart for details).  Resting comfortably. EKG is normal I do not believe that the patient has ACS.  PERC negative wells 0 highly doubt PE in this low risk patient.    Labs reviewed by me.  Patient is medically cleared by me for psychiatry.  Holding orders placed.  Disposition per psychiatry.   Final Clinical Impression(s) / ED Diagnoses       Rosalie Gelpi, MD 10/28/19 [id number]

## 2019-10-28 NOTE — ED Notes (Signed)
Called MOTHER patients Legal Guardian, answered and transferred to Dr. Rodena Medin

## 2019-10-28 NOTE — ED Notes (Signed)
Attempted to call pt mother/legal guardian. No answer/ left message for call back. Dr. Nicanor Alcon made aware.

## 2019-10-28 NOTE — Progress Notes (Signed)
Disposition: Nira Conn, NP recommends follow up in AM by psych pending guardian contact in order to obtain collateral information. Disposition pending due to unable to reach collateral. Pt's nurse Shellia Carwin, RN and EDP Palumbo, April, MD have been advised.  Princess Bruins, MSW, LCSW Therapeutic Triage Specialist  [phone]

## 2019-11-17 ENCOUNTER — Other Ambulatory Visit: Payer: Self-pay

## 2019-11-17 ENCOUNTER — Encounter (HOSPITAL_COMMUNITY): Payer: Self-pay

## 2019-11-17 ENCOUNTER — Emergency Department (HOSPITAL_COMMUNITY)
Admission: EM | Admit: 2019-11-17 | Discharge: 2019-11-17 | Disposition: A | Discharge status: Home / Self Care | Payer: Medicaid Other | Attending: Emergency Medicine | Admitting: Emergency Medicine

## 2019-11-17 DIAGNOSIS — Z79899 Other long term (current) drug therapy: Secondary | ICD-10-CM | POA: Insufficient documentation

## 2019-11-17 DIAGNOSIS — L292 Pruritus vulvae: Secondary | ICD-10-CM | POA: Insufficient documentation

## 2019-11-17 DIAGNOSIS — F840 Autistic disorder: Secondary | ICD-10-CM | POA: Diagnosis not present

## 2019-11-17 DIAGNOSIS — N760 Acute vaginitis: Secondary | ICD-10-CM | POA: Insufficient documentation

## 2019-11-17 DIAGNOSIS — F909 Attention-deficit hyperactivity disorder, unspecified type: Secondary | ICD-10-CM | POA: Insufficient documentation

## 2019-11-17 DIAGNOSIS — R1030 Lower abdominal pain, unspecified: Secondary | ICD-10-CM | POA: Diagnosis present

## 2019-11-17 DIAGNOSIS — N939 Abnormal uterine and vaginal bleeding, unspecified: Secondary | ICD-10-CM | POA: Insufficient documentation

## 2019-11-17 DIAGNOSIS — N898 Other specified noninflammatory disorders of vagina: Secondary | ICD-10-CM

## 2019-11-17 DIAGNOSIS — B9689 Other specified bacterial agents as the cause of diseases classified elsewhere: Secondary | ICD-10-CM | POA: Insufficient documentation

## 2019-11-17 DIAGNOSIS — R1084 Generalized abdominal pain: Secondary | ICD-10-CM | POA: Insufficient documentation

## 2019-11-17 DIAGNOSIS — F913 Oppositional defiant disorder: Secondary | ICD-10-CM | POA: Insufficient documentation

## 2019-11-17 DIAGNOSIS — J45909 Unspecified asthma, uncomplicated: Secondary | ICD-10-CM | POA: Insufficient documentation

## 2019-11-17 LAB — URINALYSIS, ROUTINE W REFLEX MICROSCOPIC
Bilirubin Urine: NEGATIVE
Glucose, UA: 50 mg/dL — AB
Ketones, ur: 20 mg/dL — AB
Nitrite: NEGATIVE
Protein, ur: 30 mg/dL — AB
Specific Gravity, Urine: 1.023 (ref 1.005–1.030)
pH: 5 (ref 5.0–8.0)

## 2019-11-17 LAB — WET PREP, GENITAL
Sperm: NONE SEEN
Trich, Wet Prep: NONE SEEN
Yeast Wet Prep HPF POC: NONE SEEN

## 2019-11-17 LAB — CBC WITH DIFFERENTIAL/PLATELET
Abs Immature Granulocytes: 0.02 10*3/uL (ref 0.00–0.07)
Basophils Absolute: 0 10*3/uL (ref 0.0–0.1)
Basophils Relative: 0 %
Eosinophils Absolute: 0 10*3/uL (ref 0.0–0.5)
Eosinophils Relative: 0 %
HCT: 42.7 % (ref 36.0–46.0)
Hemoglobin: 14 g/dL (ref 12.0–15.0)
Immature Granulocytes: 0 %
Lymphocytes Relative: 30 %
Lymphs Abs: 2.2 10*3/uL (ref 0.7–4.0)
MCH: 28.2 pg (ref 26.0–34.0)
MCHC: 32.8 g/dL (ref 30.0–36.0)
MCV: 86.1 fL (ref 80.0–100.0)
Monocytes Absolute: 0.6 10*3/uL (ref 0.1–1.0)
Monocytes Relative: 8 %
Neutro Abs: 4.5 10*3/uL (ref 1.7–7.7)
Neutrophils Relative %: 62 %
Platelets: 231 10*3/uL (ref 150–400)
RBC: 4.96 MIL/uL (ref 3.87–5.11)
RDW: 13.7 % (ref 11.5–15.5)
WBC: 7.4 10*3/uL (ref 4.0–10.5)
nRBC: 0 % (ref 0.0–0.2)

## 2019-11-17 LAB — COMPREHENSIVE METABOLIC PANEL
ALT: 41 U/L (ref 0–44)
AST: 30 U/L (ref 15–41)
Albumin: 4.2 g/dL (ref 3.5–5.0)
Alkaline Phosphatase: 63 U/L (ref 38–126)
Anion gap: 8 (ref 5–15)
BUN: 6 mg/dL (ref 6–20)
CO2: 22 mmol/L (ref 22–32)
Calcium: 9.5 mg/dL (ref 8.9–10.3)
Chloride: 109 mmol/L (ref 98–111)
Creatinine, Ser: 0.8 mg/dL (ref 0.44–1.00)
GFR calc Af Amer: >60 mL/min (ref >60)
GFR calc non Af Amer: >60 mL/min (ref >60)
Glucose, Bld: 99 mg/dL (ref 70–99)
Potassium: 3.7 mmol/L (ref 3.5–5.1)
Sodium: 139 mmol/L (ref 135–145)
Total Bilirubin: 1.1 mg/dL (ref 0.3–1.2)
Total Protein: 7.2 g/dL (ref 6.5–8.1)

## 2019-11-17 LAB — I-STAT BETA HCG BLOOD, ED (MC, WL, AP ONLY): I-stat hCG, quantitative: <5 m[IU]/mL (ref <5)

## 2019-11-17 LAB — HIV ANTIBODY (ROUTINE TESTING W REFLEX): HIV Screen 4th Generation wRfx: NONREACTIVE

## 2019-11-17 LAB — LIPASE, BLOOD: Lipase: 23 U/L (ref 11–51)

## 2019-11-17 MED ORDER — METRONIDAZOLE 500 MG PO TABS: 500.0000 mg | ORAL_TABLET | Freq: Two times a day (BID) | ORAL | 0 refills | Status: DC

## 2019-11-17 MED ORDER — KETOROLAC TROMETHAMINE 30 MG/ML IJ SOLN
30.0000 mg | Freq: Once | INTRAMUSCULAR | Status: AC
  Administered 2019-11-17: 23:00:00 30 mg via INTRAVENOUS
  Filled 2019-11-17: qty 1

## 2019-11-17 NOTE — Discharge Instructions (Signed)
Your workup today showed evidence of bacterial vaginosis (BV).  This is not an STD.  This is a very common occurrence which can cause vaginal discharge and itching.  He can also sometimes cause abdominal discomfort so I suspect it is causing your symptoms.  This is treated with antibiotics.  Please take all of your antibiotics until finished!   Take your antibiotics with food.  Common side effects of antibiotics include nausea, vomiting, abdominal discomfort, and diarrhea. You may help offset some of this with probiotics which you can buy or get in yogurt. Do not eat  or take the probiotics until 2 hours after your antibiotic.    Some studies suggest that certain antibiotics can reduce the efficacy of certain oral contraceptive pills (birth control), so please use additional contraceptives (condoms or other barrier method) while you are taking the antibiotics and for an additional 5 to 7 days afterwards if you are a female on these medications.  You can take 1 to 2 tablets of Tylenol (350mg -1000mg  depending on the dose) every 6 hours as needed for pain.  Do not exceed 4000 mg of Tylenol daily.  If your pain persists you can take a dose of ibuprofen in between doses of Tylenol.  I usually recommend 400 to 600 mg of ibuprofen every 6 hours.  Take this with food to avoid upset stomach issues.  Follow-up with OB/GYN for reevaluation of symptoms.  Return to the emergency department if any concerning signs or symptoms develop such as high fevers, persistent vomiting, or severe worsening pain.

## 2019-11-17 NOTE — ED Triage Notes (Signed)
Patient arrived from home via [facility] with cramping. EMS reports pt is pregnant and having miscarriage by symptoms of cramping and spotting. Pt hasn't taken pregnancy test. EMS VSS 158/100, HR 96, 100% RA.

## 2019-11-17 NOTE — ED Provider Notes (Signed)
[facility] EMERGENCY DEPARTMENT Provider Note   CSN: [id number] Arrival date & time: 11/17/19  2005     History Chief Complaint  Patient presents with  . Abdominal Pain    Amanda [name] is a 19 y.o. female with history of asthma, ADHD, depression, mood disorder, oppositional defiant disorder, autism spectrum disorder presents for evaluation of acute onset, intermittent lower abdominal cramping for 1 week.  She reports that she thinks that she may be having a miscarriage.  She has not taken a pregnancy test but states she feels as though her abdomen is "full but not like chubby, more like pregnancy".  Notes nausea, had one episode of nonbloody nonbilious emesis a few days ago but none since then.  Denies fevers, chest pain, shortness of breath, diarrhea, constipation, or urinary symptoms.  Reports vaginal itching for 1 week and thinks she might have a yeast infection.  She is currently sexually active with multiple female partners.  Has not tried anything for her symptoms.  She states that she last had a menstrual period in January but is unsure when.  She states that she had sexual intercourse the day after her period ended and states "my mom is very fertile, so I think I might be too. I have an IUD but it doesn't work for me".  Today she had a small amount of vaginal bleeding which prompted her to come to the ED.  The history is provided by the patient.       Past Medical History:  Diagnosis Date  . Asthma   . Attention deficit hyperactivity disorder   . Depression   . Eczema   . Mood disorder (HCC)   . Oppositional defiant disorder     Patient Active Problem List   Diagnosis Date Noted  . DMDD (disruptive mood dysregulation disorder) (HCC) 01/14/2014  . PTSD (post-traumatic stress disorder) 01/14/2014  . Deliberate self-cutting 01/14/2014  . Autism spectrum 01/14/2014  . ADHD (attention deficit hyperactivity disorder) 01/14/2014  . Aggression 01/14/2014  .  Suicidal ideation 01/14/2014  . ODD (oppositional defiant disorder) 01/13/2014  . Oppositional defiant disorder 11/04/2013    Past Surgical History:  Procedure Laterality Date  . ADENOIDECTOMY    . TYMPANOSTOMY TUBE PLACEMENT       OB History   No obstetric history on file.     No family history on file.  Social History   Tobacco Use  . Smoking status: Never Smoker  . Smokeless tobacco: Never Used  Substance Use Topics  . Alcohol use: No  . Drug use: No    Home Medications Prior to Admission medications   Medication Sig Start Date End Date Taking? Authorizing Provider  albuterol (PROVENTIL HFA;VENTOLIN HFA) 108 (90 Base) MCG/ACT inhaler Inhale 1-2 puffs into the lungs every 6 (six) hours as needed for wheezing or shortness of breath.    [provider]  citalopram (CELEXA) 20 MG tablet Take 1 tablet (20 mg total) by mouth daily. Patient taking differently: Take 30 mg by mouth daily.  01/16/14   Winson, Louie Bun, NP  hydrOXYzine (VISTARIL) 25 MG capsule Take 25 mg by mouth 3 (three) times daily as needed for anxiety.    [provider]  levothyroxine (SYNTHROID, LEVOTHROID) 50 MCG tablet Take 50 mcg by mouth daily before breakfast.    [provider]  lithium carbonate (ESKALITH) 450 MG CR tablet Take 1 tablet (450 mg total) by mouth 2 (two) times daily. Patient taking differently: Take 300 mg  by mouth 3 (three) times daily.  01/16/14   Winson, Manus Rudd, NP  metroNIDAZOLE (FLAGYL) 500 MG tablet Take 1 tablet (500 mg total) by mouth 2 (two) times daily. 11/17/19   Nils Flack, Hinda Lindor A, PA-C  dicyclomine (BENTYL) 20 MG tablet Take 1 tablet (20 mg total) by mouth every 12 (twelve) hours as needed (abdominal pain/cramping). Patient not taking: Reported on 06/05/2019 04/06/18 08/01/19  Antonietta Breach, PA-C  fluticasone [facility]) 50 MCG/ACT nasal spray Place 2 sprays into both nostrils daily. Patient not taking: Reported on 08/01/2019 12/15/18 08/01/19  Julianne Rice, MD    loratadine (CLARITIN) 10 MG tablet Take 1 tablet (10 mg total) by mouth daily. Patient not taking: Reported on 05/28/2019 12/15/18 08/01/19  Julianne Rice, MD    Allergies    Poison ivy extract [poison ivy extract] and Sulfa antibiotics  Review of Systems   Review of Systems  Constitutional: Negative for chills and fever.  Respiratory: Negative for shortness of breath.   Cardiovascular: Negative for chest pain.  Gastrointestinal: Positive for abdominal pain, nausea and vomiting. Negative for constipation and diarrhea.  Genitourinary: Positive for menstrual problem, vaginal bleeding and vaginal discharge. Negative for dysuria, frequency, hematuria, urgency and vaginal pain.  All other systems reviewed and are negative.   Physical Exam Updated Vital Signs BP 118/85   Pulse (!) 56   Temp 98.2 F (36.8 C) (Oral)   Resp 20   Ht 5\' 7"  (1.702 m)   Wt 99.8 kg   SpO2 98%   BMI 34.46 kg/m   Physical Exam Vitals and nursing note reviewed.  Constitutional:      General: She is not in acute distress.    Appearance: She is well-developed.  HENT:     Head: Normocephalic and atraumatic.  Eyes:     General:        Right eye: No discharge.        Left eye: No discharge.     Conjunctiva/sclera: Conjunctivae normal.  Neck:     Vascular: No JVD.     Trachea: No tracheal deviation.  Cardiovascular:     Rate and Rhythm: Normal rate and regular rhythm.     Heart sounds: Normal heart sounds.  Pulmonary:     Effort: Pulmonary effort is normal.     Breath sounds: Normal breath sounds.  Abdominal:     General: Abdomen is flat. Bowel sounds are normal. There is no distension.     Palpations: Abdomen is soft.     Tenderness: There is generalized abdominal tenderness. There is no right CVA tenderness, left CVA tenderness, guarding or rebound. Negative signs include Murphy's sign and Rovsing's sign.  Genitourinary:    Comments: Examination performed in the presence of a chaperone.  No  masses or lesions to the external genitalia.  Moderate amount of vaginal bleeding.  No cervical motion tenderness or adnexal tenderness.  Scant amount of white vaginal discharge noted. Skin:    General: Skin is warm and dry.     Findings: No erythema.  Neurological:     Mental Status: She is alert.  Psychiatric:        Mood and Affect: Affect is labile.     Comments: Slightly bizarre behavior, does not appear to be responding to internal stimuli at this time.     ED Results / Procedures / Treatments   Labs (all labs ordered are listed, but only abnormal results are displayed) Labs Reviewed  WET PREP, GENITAL - Abnormal; Notable for the following  components:      Result Value   Clue Cells Wet Prep HPF POC PRESENT (*)    WBC, Wet Prep HPF POC FEW (*)    All other components within normal limits  URINALYSIS, ROUTINE W REFLEX MICROSCOPIC - Abnormal; Notable for the following components:   Color, Urine AMBER (*)    APPearance HAZY (*)    Glucose, UA 50 (*)    Hgb urine dipstick LARGE (*)    Ketones, ur 20 (*)    Protein, ur 30 (*)    Leukocytes,Ua TRACE (*)    Bacteria, UA RARE (*)    All other components within normal limits  COMPREHENSIVE METABOLIC PANEL  CBC WITH DIFFERENTIAL/PLATELET  LIPASE, BLOOD  RPR  HIV ANTIBODY (ROUTINE TESTING W REFLEX)  I-STAT BETA HCG BLOOD, ED ([facility], [facility], [facility] ONLY)  GC/CHLAMYDIA PROBE AMP ([place]) NOT AT [facility]    EKG None  Radiology No results found.  Procedures Procedures (including critical care time)  Medications Ordered in ED Medications  ketorolac (TORADOL) 30 MG/ML injection 30 mg (30 mg Intravenous Given 11/17/19 2315)    ED Course  I have reviewed the triage vital signs and the nursing notes.  Pertinent labs & imaging results that were available during my care of the patient were reviewed by me and considered in my medical decision making (see chart for details).    MDM Rules/Calculators/A&P                      Patient  presenting for evaluation of generalized abdominal pain for 2 weeks. Started having vaginal bleeding today, states she thinks she was pregnant but is miscarrying. However, she has not taken any pregnancy tests at home.  Reports the last time that she had sexual intercourse was after she completed her menstrual cycle last month.  On my assessment abdomen is soft, no peritoneal signs.  Generalized tenderness verbalized by the patient.  The patient is afebrile and vital signs are stable.  She is nontoxic in appearance.  Pelvic examination showed small amount of blood, no cervical motion tenderness or adnexal tenderness.  Her pregnancy test is negative. When I informed her of this she became very upset and was incredulous. She states that all of her friends told her she might be pregnant and her friend recently had a miscarriage so she does not understand how she could not be pregnant. I informed her that there could be a variety of reasons why she is experiencing abdominal cramping.  She exhibits somewhat bizarre affect, emotionally labile but does not appear to be a danger to herself or others at this time.  However, her workup today is reassuring.  Lab work reviewed and interpreted by myself shows no leukocytosis, no anemia, no metabolic derangements, no renal insufficiency.  Lipase and LFTs are within normal limits.  Her UA does not suggest UTI or nephrolithiasis.  She was given IV Toradol with improvement in her symptoms.  She is tolerating p.o. food and fluids on reevaluation.  Her wet prep shows clue cells and WBCs so we will treat her for BV.  I have a very low suspicion of acute surgical abdominal pathology.  Recommend follow-up with OB/GYN for reevaluation of symptoms.  Discussed strict ED return precautions.  Patient verbalized understanding of and agreement with plan and is stable for discharge home at this time.  I attempted to call the patient's guardian through DSS but my call went to  voicemail.   Final  Clinical Impression(s) / ED Diagnoses Final diagnoses:  Vaginal itching  BV (bacterial vaginosis)  Vaginal bleeding  Generalized abdominal pain    Rx / DC Orders ED Discharge Orders         Ordered    metroNIDAZOLE (FLAGYL) 500 MG tablet  2 times daily,   Status:  Discontinued     11/17/19 2316    metroNIDAZOLE (FLAGYL) 500 MG tablet  2 times daily     11/17/19 2328           Bennye Alm 11/18/19 1711    Gerhard Munch, MD 11/18/19 2344

## 2019-11-18 LAB — RPR: RPR Ser Ql: NONREACTIVE

## 2019-11-20 LAB — GC/CHLAMYDIA PROBE AMP (~~LOC~~) NOT AT ARMC
Chlamydia: POSITIVE — AB
Neisseria Gonorrhea: NEGATIVE

## 2019-11-21 ENCOUNTER — Other Ambulatory Visit: Payer: Self-pay | Admitting: Medical

## 2019-11-21 DIAGNOSIS — A749 Chlamydial infection, unspecified: Secondary | ICD-10-CM

## 2019-11-21 MED ORDER — AZITHROMYCIN 250 MG PO TABS: 1000.0000 mg | ORAL_TABLET | Freq: Once | ORAL | 0 refills | Status: AC

## 2019-12-06 ENCOUNTER — Other Ambulatory Visit: Payer: Self-pay

## 2019-12-06 ENCOUNTER — Encounter (HOSPITAL_COMMUNITY): Payer: Self-pay

## 2019-12-06 ENCOUNTER — Emergency Department (HOSPITAL_COMMUNITY): Payer: Medicaid Other

## 2019-12-06 ENCOUNTER — Emergency Department (HOSPITAL_COMMUNITY)
Admission: EM | Admit: 2019-12-06 | Discharge: 2019-12-06 | Disposition: A | Discharge status: Home / Self Care | Payer: Medicaid Other | Attending: Emergency Medicine | Admitting: Emergency Medicine

## 2019-12-06 DIAGNOSIS — F840 Autistic disorder: Secondary | ICD-10-CM | POA: Diagnosis not present

## 2019-12-06 DIAGNOSIS — R1010 Upper abdominal pain, unspecified: Secondary | ICD-10-CM | POA: Diagnosis not present

## 2019-12-06 DIAGNOSIS — F909 Attention-deficit hyperactivity disorder, unspecified type: Secondary | ICD-10-CM | POA: Insufficient documentation

## 2019-12-06 DIAGNOSIS — J45909 Unspecified asthma, uncomplicated: Secondary | ICD-10-CM | POA: Insufficient documentation

## 2019-12-06 LAB — CBC WITH DIFFERENTIAL/PLATELET
Abs Immature Granulocytes: 0.03 10*3/uL (ref 0.00–0.07)
Basophils Absolute: 0 10*3/uL (ref 0.0–0.1)
Basophils Relative: 0 %
Eosinophils Absolute: 0 10*3/uL (ref 0.0–0.5)
Eosinophils Relative: 0 %
HCT: 39.2 % (ref 36.0–46.0)
Hemoglobin: 13 g/dL (ref 12.0–15.0)
Immature Granulocytes: 0 %
Lymphocytes Relative: 37 %
Lymphs Abs: 4.4 10*3/uL — ABNORMAL HIGH (ref 0.7–4.0)
MCH: 29.1 pg (ref 26.0–34.0)
MCHC: 33.2 g/dL (ref 30.0–36.0)
MCV: 87.9 fL (ref 80.0–100.0)
Monocytes Absolute: 1 10*3/uL (ref 0.1–1.0)
Monocytes Relative: 8 %
Neutro Abs: 6.4 10*3/uL (ref 1.7–7.7)
Neutrophils Relative %: 55 %
Platelets: 294 10*3/uL (ref 150–400)
RBC: 4.46 MIL/uL (ref 3.87–5.11)
RDW: 13.9 % (ref 11.5–15.5)
WBC: 11.8 10*3/uL — ABNORMAL HIGH (ref 4.0–10.5)
nRBC: 0 % (ref 0.0–0.2)

## 2019-12-06 LAB — COMPREHENSIVE METABOLIC PANEL
ALT: 20 U/L (ref 0–44)
AST: 16 U/L (ref 15–41)
Albumin: 4.2 g/dL (ref 3.5–5.0)
Alkaline Phosphatase: 63 U/L (ref 38–126)
Anion gap: 6 (ref 5–15)
BUN: 14 mg/dL (ref 6–20)
CO2: 24 mmol/L (ref 22–32)
Calcium: 9.4 mg/dL (ref 8.9–10.3)
Chloride: 108 mmol/L (ref 98–111)
Creatinine, Ser: 0.68 mg/dL (ref 0.44–1.00)
GFR calc Af Amer: >60 mL/min (ref >60)
GFR calc non Af Amer: >60 mL/min (ref >60)
Glucose, Bld: 106 mg/dL — ABNORMAL HIGH (ref 70–99)
Potassium: 4.2 mmol/L (ref 3.5–5.1)
Sodium: 138 mmol/L (ref 135–145)
Total Bilirubin: 0.8 mg/dL (ref 0.3–1.2)
Total Protein: 7.1 g/dL (ref 6.5–8.1)

## 2019-12-06 LAB — URINALYSIS, ROUTINE W REFLEX MICROSCOPIC
Bilirubin Urine: NEGATIVE
Glucose, UA: NEGATIVE mg/dL
Hgb urine dipstick: NEGATIVE
Ketones, ur: NEGATIVE mg/dL
Leukocytes,Ua: NEGATIVE
Nitrite: NEGATIVE
Protein, ur: NEGATIVE mg/dL
Specific Gravity, Urine: 1.032 — ABNORMAL HIGH (ref 1.005–1.030)
pH: 5 (ref 5.0–8.0)

## 2019-12-06 LAB — PREGNANCY, URINE: Preg Test, Ur: NEGATIVE

## 2019-12-06 MED ORDER — KETOROLAC TROMETHAMINE 30 MG/ML IJ SOLN
30.0000 mg | Freq: Once | INTRAMUSCULAR | Status: AC
  Administered 2019-12-06: 30 mg via INTRAVENOUS
  Filled 2019-12-06: qty 1

## 2019-12-06 MED ORDER — LIDOCAINE VISCOUS HCL 2 % MT SOLN
15.0000 mL | Freq: Once | OROMUCOSAL | Status: DC
  Filled 2019-12-06: qty 15

## 2019-12-06 MED ORDER — DICYCLOMINE HCL 20 MG PO TABS: 20.0000 mg | ORAL_TABLET | Freq: Two times a day (BID) | ORAL | 0 refills | Status: DC | PRN

## 2019-12-06 MED ORDER — PANTOPRAZOLE SODIUM 40 MG IV SOLR
40.0000 mg | Freq: Once | INTRAVENOUS | Status: AC
  Administered 2019-12-06: 40 mg via INTRAVENOUS
  Filled 2019-12-06: qty 40

## 2019-12-06 MED ORDER — DICYCLOMINE HCL 10 MG PO CAPS
10.0000 mg | ORAL_CAPSULE | Freq: Once | ORAL | Status: AC
  Administered 2019-12-06: 10 mg via ORAL
  Filled 2019-12-06: qty 1

## 2019-12-06 MED ORDER — FENTANYL CITRATE (PF) 100 MCG/2ML IJ SOLN
50.0000 ug | Freq: Once | INTRAMUSCULAR | Status: AC
  Administered 2019-12-06: 50 ug via INTRAVENOUS
  Filled 2019-12-06: qty 2

## 2019-12-06 MED ORDER — METOCLOPRAMIDE HCL 5 MG/ML IJ SOLN
10.0000 mg | Freq: Once | INTRAMUSCULAR | Status: AC
  Administered 2019-12-06: 10 mg via INTRAVENOUS
  Filled 2019-12-06: qty 2

## 2019-12-06 MED ORDER — ALUM & MAG HYDROXIDE-SIMETH 200-200-20 MG/5ML PO SUSP
30.0000 mL | Freq: Once | ORAL | Status: DC
  Filled 2019-12-06: qty 30

## 2019-12-06 NOTE — ED Provider Notes (Signed)
[facility] DEPT Provider Note   CSN: [id number] Arrival date & time: 12/06/19  0416     History No chief complaint on file.   Amanda [name] is a 19 y.o. female.  19 year old female with a history of ADHD, depression, asthma presents to the emergency department for complaints of abdominal pain.  She reports onset of abdominal pain yesterday.  Has been fairly constant and sharp across her upper abdomen.  Noticed some increased discomfort when she was playing with her cat.  She has not had any relief from over-the-counter medications.  No vomiting, bowel changes, urinary symptoms, vaginal complaints.  Does have a history of mid to low back pain which has been ongoing x1 month.  Is unsure if this is related to her abdominal pain today.  She has not had any prior abdominal surgeries.  LMP 11/17/19.  The history is provided by the patient. No language interpreter was used.       Past Medical History:  Diagnosis Date  . Asthma   . Attention deficit hyperactivity disorder   . Depression   . Eczema   . Mood disorder ([place])   . Oppositional defiant disorder     Patient Active Problem List   Diagnosis Date Noted  . DMDD (disruptive mood dysregulation disorder) ([place]) 01/14/2014  . PTSD (post-traumatic stress disorder) 01/14/2014  . Deliberate self-cutting 01/14/2014  . Autism spectrum 01/14/2014  . ADHD (attention deficit hyperactivity disorder) 01/14/2014  . Aggression 01/14/2014  . Suicidal ideation 01/14/2014  . ODD (oppositional defiant disorder) 01/13/2014  . Oppositional defiant disorder 11/04/2013    Past Surgical History:  Procedure Laterality Date  . ADENOIDECTOMY    . TYMPANOSTOMY TUBE PLACEMENT       OB History   No obstetric history on file.     No family history on file.  Social History   Tobacco Use  . Smoking status: Never Smoker  . Smokeless tobacco: Never Used  Substance Use Topics  . Alcohol use: No  . Drug use: No      Home Medications Prior to Admission medications   Medication Sig Start Date End Date Taking? Authorizing Provider  albuterol (PROVENTIL HFA;VENTOLIN HFA) 108 (90 Base) MCG/ACT inhaler Inhale 1-2 puffs into the lungs every 6 (six) hours as needed for wheezing or shortness of breath.    [provider]  citalopram (CELEXA) 20 MG tablet Take 1 tablet (20 mg total) by mouth daily. Patient taking differently: Take 30 mg by mouth daily.  01/16/14   Winson, Manus Rudd, NP  dicyclomine (BENTYL) 20 MG tablet Take 1 tablet (20 mg total) by mouth every 12 (twelve) hours as needed (for abdominal pain/cramping). 12/06/19   Antonietta Breach, PA-C  hydrOXYzine (VISTARIL) 25 MG capsule Take 25 mg by mouth 3 (three) times daily as needed for anxiety.    [provider]  levothyroxine (SYNTHROID, LEVOTHROID) 50 MCG tablet Take 50 mcg by mouth daily before breakfast.    [provider]  lithium carbonate (ESKALITH) 450 MG CR tablet Take 1 tablet (450 mg total) by mouth 2 (two) times daily. Patient taking differently: Take 300 mg by mouth 3 (three) times daily.  01/16/14   Winson, Manus Rudd, NP  metroNIDAZOLE (FLAGYL) 500 MG tablet Take 1 tablet (500 mg total) by mouth 2 (two) times daily. 11/17/19   Fawze, Mina A, PA-C  fluticasone (FLONASE) 50 MCG/ACT nasal spray Place 2 sprays into both nostrils daily. Patient not taking: Reported on 08/01/2019 12/15/18 08/01/19  Loren Racer, MD  loratadine (CLARITIN) 10 MG tablet Take 1 tablet (10 mg total) by mouth daily. Patient not taking: Reported on 05/28/2019 12/15/18 08/01/19  Loren Racer, MD    Allergies    Poison ivy extract [poison ivy extract] and Sulfa antibiotics  Review of Systems   Review of Systems  Ten systems reviewed and are negative for acute change, except as noted in the HPI.    Physical Exam Updated Vital Signs BP 100/68   Pulse 80   Temp 98.6 F (37 C) (Oral)   Resp 18   Ht 5\' 7"  (1.702 m)   Wt 92.4 kg   LMP 11/17/2019    SpO2 100%   BMI 31.89 kg/m   Physical Exam Vitals and nursing note reviewed.  Constitutional:      General: She is not in acute distress.    Appearance: She is well-developed. She is not diaphoretic.     Comments: Nontoxic appearing, obese female  HENT:     Head: Normocephalic and atraumatic.  Eyes:     General: No scleral icterus.    Conjunctiva/sclera: Conjunctivae normal.  Cardiovascular:     Rate and Rhythm: Normal rate and regular rhythm.     Pulses: Normal pulses.  Pulmonary:     Effort: Pulmonary effort is normal. No respiratory distress.     Breath sounds: No stridor. No wheezing.     Comments: Respirations even and unlabored Abdominal:     Palpations: Abdomen is soft. There is no mass.     Tenderness: There is abdominal tenderness. There is no guarding.     Comments: Right upper quadrant and epigastric tenderness to palpation.  Negative Murphy sign.  No palpable masses or peritoneal signs on exam.  Musculoskeletal:        General: Normal range of motion.     Cervical back: Normal range of motion.  Skin:    General: Skin is warm and dry.     Coloration: Skin is not pale.     Findings: No erythema or rash.  Neurological:     Mental Status: She is alert and oriented to person, place, and time.  Psychiatric:        Behavior: Behavior normal.     ED Results / Procedures / Treatments   Labs (all labs ordered are listed, but only abnormal results are displayed) Labs Reviewed  URINALYSIS, ROUTINE W REFLEX MICROSCOPIC - Abnormal; Notable for the following components:      Result Value   APPearance HAZY (*)    Specific Gravity, Urine 1.032 (*)    All other components within normal limits  CBC WITH DIFFERENTIAL/PLATELET - Abnormal; Notable for the following components:   WBC 11.8 (*)    Lymphs Abs 4.4 (*)    All other components within normal limits  COMPREHENSIVE METABOLIC PANEL - Abnormal; Notable for the following components:   Glucose, Bld 106 (*)    All  other components within normal limits  PREGNANCY, URINE    EKG None  Radiology 11/19/2019 Abdomen Limited  Result Date: 12/06/2019 CLINICAL DATA:  Right upper quadrant tenderness to palpation EXAM: ULTRASOUND ABDOMEN LIMITED RIGHT UPPER QUADRANT COMPARISON:  08/28/2018 abdominal CT FINDINGS: Gallbladder: No gallstones or wall thickening visualized. No sonographic Murphy sign noted by sonographer. Common bile duct: Diameter: 3 mm. Liver: No focal lesion identified. Within normal limits in parenchymal echogenicity. Portal vein is patent on color Doppler imaging with normal direction of blood flow towards the liver. IMPRESSION: Negative right upper quadrant  ultrasound. Electronically Signed   By: Marnee Spring M.D.   On: 12/06/2019 05:53    Procedures Procedures (including critical care time)  Medications Ordered in ED Medications  alum & mag hydroxide-simeth (MAALOX/MYLANTA) 200-200-20 MG/5ML suspension 30 mL (has no administration in time range)    And  lidocaine (XYLOCAINE) 2 % viscous mouth solution 15 mL (has no administration in time range)  ketorolac (TORADOL) 30 MG/ML injection 30 mg (has no administration in time range)  dicyclomine (BENTYL) capsule 10 mg (has no administration in time range)  metoCLOPramide (REGLAN) injection 10 mg (10 mg Intravenous Given 12/06/19 0517)  pantoprazole (PROTONIX) injection 40 mg (40 mg Intravenous Given 12/06/19 0523)  fentaNYL (SUBLIMAZE) injection 50 mcg (50 mcg Intravenous Given 12/06/19 1610)    ED Course  I have reviewed the triage vital signs and the nursing notes.  Pertinent labs & imaging results that were available during my care of the patient were reviewed by me and considered in my medical decision making (see chart for details).    MDM Rules/Calculators/A&P                      Patient is nontoxic, nonseptic appearing, in no apparent distress.  Patient's pain and other symptoms adequately managed in emergency department.  Labs, imaging  and vitals reviewed.  Leukocytosis noted which is mild and historically appears chronic.  On repeat exam patient does not have a surgical abdomin and there are no peritoneal signs.  She has had symptomatic improvement with supportive medications.  Patient discharged home with symptomatic treatment and given strict instructions for follow-up with her primary care physician.  Return precautions discussed and provided. Patient discharged in stable condition with no unaddressed concerns.   Final Clinical Impression(s) / ED Diagnoses Final diagnoses:  Pain of upper abdomen    Rx / DC Orders ED Discharge Orders         Ordered    dicyclomine (BENTYL) 20 MG tablet  Every 12 hours PRN     12/06/19 0619           Antony Madura, PA-C 12/21/19 0549    Molpus, Jonny Ruiz, MD 12/21/19 [phone]

## 2019-12-06 NOTE — ED Triage Notes (Signed)
BIB EMS from home with complaints of abdominal pain that radiates to her back. lower right to all over. No rebound tenderness. Denies N/V/D.   130/80 80 100% 97.9

## 2019-12-06 NOTE — Discharge Instructions (Addendum)
Your evaluation in the emergency department today has been reassuring.  We recommend Bentyl as prescribed for management of persistent abdominal pain/cramping that is not improved with ibuprofen or Tylenol.  Follow-up with your primary care doctor to ensure that symptoms resolve.  Return to the ED for any new or concerning symptoms.

## 2019-12-06 NOTE — ED Notes (Signed)
PT called out ready to go home. Pt A/O and ambulatory upon discharge. Bus pass provided. Pt verbalized understanding of discharge instructions. PT ambulatory out of ED to ride the bus.

## 2019-12-06 NOTE — ED Notes (Signed)
Pt provided water and crackers for PO challenge  

## 2019-12-06 NOTE — ED Notes (Signed)
US at bedside

## 2019-12-06 NOTE — ED Notes (Signed)
PT able to tolerate water and crackers at this time without vomiting

## 2019-12-18 ENCOUNTER — Encounter (HOSPITAL_COMMUNITY): Payer: Self-pay

## 2019-12-18 ENCOUNTER — Other Ambulatory Visit: Payer: Self-pay

## 2019-12-18 ENCOUNTER — Emergency Department (HOSPITAL_COMMUNITY)
Admission: EM | Admit: 2019-12-18 | Discharge: 2019-12-18 | Disposition: A | Discharge status: Home / Self Care | Payer: Medicaid Other | Attending: Emergency Medicine | Admitting: Emergency Medicine

## 2019-12-18 DIAGNOSIS — J45909 Unspecified asthma, uncomplicated: Secondary | ICD-10-CM | POA: Insufficient documentation

## 2019-12-18 DIAGNOSIS — Z79899 Other long term (current) drug therapy: Secondary | ICD-10-CM | POA: Insufficient documentation

## 2019-12-18 DIAGNOSIS — M545 Low back pain, unspecified: Secondary | ICD-10-CM

## 2019-12-18 DIAGNOSIS — R300 Dysuria: Secondary | ICD-10-CM | POA: Insufficient documentation

## 2019-12-18 DIAGNOSIS — M549 Dorsalgia, unspecified: Secondary | ICD-10-CM | POA: Diagnosis present

## 2019-12-18 LAB — URINALYSIS, ROUTINE W REFLEX MICROSCOPIC
Bilirubin Urine: NEGATIVE
Glucose, UA: NEGATIVE mg/dL
Ketones, ur: 5 mg/dL — AB
Leukocytes,Ua: NEGATIVE
Nitrite: NEGATIVE
Protein, ur: NEGATIVE mg/dL
Specific Gravity, Urine: 1.029 (ref 1.005–1.030)
pH: 5 (ref 5.0–8.0)

## 2019-12-18 LAB — POC URINE PREG, ED: Preg Test, Ur: NEGATIVE

## 2019-12-18 MED ORDER — CEFTRIAXONE SODIUM 1 G IJ SOLR
500.0000 mg | Freq: Once | INTRAMUSCULAR | Status: AC
  Administered 2019-12-18: 500 mg via INTRAMUSCULAR
  Filled 2019-12-18: qty 10

## 2019-12-18 MED ORDER — METHOCARBAMOL 500 MG PO TABS: 500.0000 mg | ORAL_TABLET | Freq: Two times a day (BID) | ORAL | 0 refills | Status: DC

## 2019-12-18 MED ORDER — LIDOCAINE HCL 1 % IJ SOLN
INTRAMUSCULAR | Status: AC
  Administered 2019-12-18: 1 mL
  Filled 2019-12-18: qty 20

## 2019-12-18 MED ORDER — AZITHROMYCIN 250 MG PO TABS
1000.0000 mg | ORAL_TABLET | Freq: Once | ORAL | Status: AC
  Administered 2019-12-18: 1000 mg via ORAL
  Filled 2019-12-18: qty 4

## 2019-12-18 NOTE — ED Triage Notes (Signed)
Arrived by EMS from home. Patient reports lower back pain since march 13. Being treated for chlamydia and yeast infection

## 2019-12-18 NOTE — Discharge Instructions (Addendum)
Your urine today did not show signs of infection. We have re-treated you for possible STD. Take the prescribed medication for back pain as directed.  Can take with tylenol and/or motrin and try warm compress or heating pad to help. Follow-up with your primary care doctor. Return here for any new/acute changes.

## 2019-12-18 NOTE — ED Provider Notes (Addendum)
[facility]-EMERGENCY DEPT Provider Note   CSN: [id number] Arrival date & time: 12/18/19  0018     History Chief Complaint  Patient presents with  . Back Pain    Amanda [name] is a 19 y.o. female.  The history is provided by the patient and medical records.    19 year old female with history of asthma, ADHD, depression, mood disorder, oppositional defiant disorder, presenting to the ED with complaint of back pain.  This has been an ongoing issue since February.  States she initially attributed it to a pulled muscle, but pain has persisted.  No radiation of pain into the legs.  No bowel or bladder incontinence.  Patient was able to ambulate into ED from EMS bay.  She was seen earlier in the month and diagnosed with chlamydia as well as a yeast infection.  She reports she was treated appropriately.  She has not had any ongoing vaginal discharge or pelvic pain.  She has had some lingering dysuria.  She has not had any sexual intercourse since treatment earlier in the month.  She denies any fever or flank pain.  No history of kidney stones.    Past Medical History:  Diagnosis Date  . Asthma   . Attention deficit hyperactivity disorder   . Depression   . Eczema   . Mood disorder (HCC)   . Oppositional defiant disorder     Patient Active Problem List   Diagnosis Date Noted  . DMDD (disruptive mood dysregulation disorder) (HCC) 01/14/2014  . PTSD (post-traumatic stress disorder) 01/14/2014  . Deliberate self-cutting 01/14/2014  . Autism spectrum 01/14/2014  . ADHD (attention deficit hyperactivity disorder) 01/14/2014  . Aggression 01/14/2014  . Suicidal ideation 01/14/2014  . ODD (oppositional defiant disorder) 01/13/2014  . Oppositional defiant disorder 11/04/2013    Past Surgical History:  Procedure Laterality Date  . ADENOIDECTOMY    . TYMPANOSTOMY TUBE PLACEMENT       OB History   No obstetric history on file.     No family history on  file.  Social History   Tobacco Use  . Smoking status: Never Smoker  . Smokeless tobacco: Never Used  Substance Use Topics  . Alcohol use: No  . Drug use: No    Home Medications Prior to Admission medications   Medication Sig Start Date End Date Taking? Authorizing Provider  albuterol (PROVENTIL HFA;VENTOLIN HFA) 108 (90 Base) MCG/ACT inhaler Inhale 1-2 puffs into the lungs every 6 (six) hours as needed for wheezing or shortness of breath.    [provider]  citalopram (CELEXA) 20 MG tablet Take 1 tablet (20 mg total) by mouth daily. Patient taking differently: Take 30 mg by mouth daily.  01/16/14   Winson, Louie Bun, NP  dicyclomine (BENTYL) 20 MG tablet Take 1 tablet (20 mg total) by mouth every 12 (twelve) hours as needed (for abdominal pain/cramping). 12/06/19   Antony Madura, PA-C  hydrOXYzine (VISTARIL) 25 MG capsule Take 25 mg by mouth 3 (three) times daily as needed for anxiety.    [provider]  levothyroxine (SYNTHROID, LEVOTHROID) 50 MCG tablet Take 50 mcg by mouth daily before breakfast.    [provider]  lithium carbonate (ESKALITH) 450 MG CR tablet Take 1 tablet (450 mg total) by mouth 2 (two) times daily. Patient taking differently: Take 300 mg by mouth 3 (three) times daily.  01/16/14   Winson, Louie Bun, NP  metroNIDAZOLE (FLAGYL) 500 MG tablet Take 1 tablet (500 mg total) by mouth  2 (two) times daily. 11/17/19   Fawze, Mina A, PA-C  fluticasone (FLONASE) 50 MCG/ACT nasal spray Place 2 sprays into both nostrils daily. Patient not taking: Reported on 08/01/2019 12/15/18 08/01/19  Loren Racer, MD  loratadine (CLARITIN) 10 MG tablet Take 1 tablet (10 mg total) by mouth daily. Patient not taking: Reported on 05/28/2019 12/15/18 08/01/19  Loren Racer, MD    Allergies    Poison ivy extract [poison ivy extract] and Sulfa antibiotics  Review of Systems   Review of Systems  Musculoskeletal: Positive for back pain.  All other systems reviewed and  are negative.   Physical Exam Updated Vital Signs BP 137/83 (BP Location: Left Arm)   Pulse 85   Temp 98.7 F (37.1 C) (Oral)   Resp 16   Ht 5\' 7"  (1.702 m)   Wt 93.4 kg   SpO2 99%   BMI 32.26 kg/m   Physical Exam Vitals and nursing note reviewed.  Constitutional:      Appearance: She is well-developed.     Comments: Sleeping, had to be awoken for exam, no acute distress  HENT:     Head: Normocephalic and atraumatic.  Eyes:     Conjunctiva/sclera: Conjunctivae normal.     Pupils: Pupils are equal, round, and reactive to light.  Cardiovascular:     Rate and Rhythm: Normal rate and regular rhythm.     Heart sounds: Normal heart sounds.  Pulmonary:     Effort: Pulmonary effort is normal.     Breath sounds: Normal breath sounds.  Abdominal:     General: Bowel sounds are normal.     Palpations: Abdomen is soft.     Comments: Soft, non-tender  Musculoskeletal:        General: Normal range of motion.     Cervical back: Normal range of motion.     Comments: No midline lumbar spinal tenderness, no deformity or step-off; some mild muscle tenderness of the paraspinal musculature, no significant spasm noted, ambulatory with steady gait  Skin:    General: Skin is warm and dry.  Neurological:     Mental Status: She is alert and oriented to person, place, and time.     ED Results / Procedures / Treatments   Labs (all labs ordered are listed, but only abnormal results are displayed) Labs Reviewed  URINALYSIS, ROUTINE W REFLEX MICROSCOPIC - Abnormal; Notable for the following components:      Result Value   APPearance HAZY (*)    Hgb urine dipstick SMALL (*)    Ketones, ur 5 (*)    Bacteria, UA RARE (*)    All other components within normal limits  POC URINE PREG, ED    EKG None  Radiology No results found.  Procedures Procedures (including critical care time)  Medications Ordered in ED Medications - No data to display  ED Course  I have reviewed the triage  vital signs and the nursing notes.  Pertinent labs & imaging results that were available during my care of the patient were reviewed by me and considered in my medical decision making (see chart for details).    MDM Rules/Calculators/A&P  19 year old female presenting to the ED with back pain.  This is been ongoing since February.  She is very vague in her description of pain.  She denies any known injury, trauma, or falls.  Patient does report some dysuria.  She was treated earlier this month for chlamydia and a yeast infection.  She denies any pelvic pain  or vaginal discharge.  She has not been sexually active since treatment.  She feels like her chlamydia never cleared up.  On presentation, patient is sleeping and had to be awoken for exam.  She does have some mild muscular tenderness along the lumbar spine but there is no significant spasm.  No midline deformity or step-off.  She has no CVA tenderness.  Abdomen is soft and benign.  Suspect this is likely musculoskeletal.  She has no focal neurologic deficits here.  No red flag symptoms.  Without trauma I have low suspicion for acute vertebral fracture or subluxation and do not feel she needs any emergent imaging.  We will plan to treat symptomatically.  In regards to dysuria, UA here without signs of infection.  She expresses concern for untreated chlamydia.  On chart review, it appears she was treated appropriately in GYN clinic with 1 g p.o. azithromycin.  Given that back pain began prior to diagnosis of chlamydia, she has not been sexually active since treatment, and lack of any ongoing symptoms such as pelvic pain or vaginal discharge, I have low suspicion for PID or other acute complication.  She is requesting repeat treatment today which has been given.  We will plan to discharge home with symptomatic care.  Can follow-up with PCP.  Return here for any new/acute changes.  Final Clinical Impression(s) / ED Diagnoses Final diagnoses:  Acute  bilateral low back pain without sciatica    Rx / DC Orders ED Discharge Orders         Ordered    methocarbamol (ROBAXIN) 500 MG tablet  2 times daily     12/18/19 0529           Larene Pickett, PA-C 12/18/19 0559    Larene Pickett, PA-C 12/18/19 0601    Rolland Porter, MD 12/18/19 [phone]

## 2019-12-24 ENCOUNTER — Encounter (HOSPITAL_COMMUNITY): Payer: Self-pay

## 2019-12-24 ENCOUNTER — Other Ambulatory Visit: Payer: Self-pay

## 2019-12-24 ENCOUNTER — Emergency Department (HOSPITAL_COMMUNITY)
Admission: EM | Admit: 2019-12-24 | Discharge: 2019-12-25 | Disposition: A | Discharge status: Home / Self Care | Payer: Medicaid Other | Attending: Emergency Medicine | Admitting: Emergency Medicine

## 2019-12-24 DIAGNOSIS — J45909 Unspecified asthma, uncomplicated: Secondary | ICD-10-CM | POA: Diagnosis not present

## 2019-12-24 DIAGNOSIS — Z79899 Other long term (current) drug therapy: Secondary | ICD-10-CM | POA: Insufficient documentation

## 2019-12-24 DIAGNOSIS — N760 Acute vaginitis: Secondary | ICD-10-CM | POA: Diagnosis not present

## 2019-12-24 DIAGNOSIS — B9689 Other specified bacterial agents as the cause of diseases classified elsewhere: Secondary | ICD-10-CM

## 2019-12-24 DIAGNOSIS — N898 Other specified noninflammatory disorders of vagina: Secondary | ICD-10-CM | POA: Diagnosis present

## 2019-12-24 DIAGNOSIS — F840 Autistic disorder: Secondary | ICD-10-CM | POA: Insufficient documentation

## 2019-12-24 NOTE — ED Triage Notes (Signed)
Patient arrived stating she was dianosed with Chlamydia in February and has been treated three times but stating that it is not going away. States she came today because of yellow discharge. Reports wanting a blood test to test for pregnancy and IV treatment for the Chlamydia. Reports still sexually active with use of condoms.

## 2019-12-24 NOTE — ED Provider Notes (Signed)
[facility] DEPT Provider Note  CSN: [id number] Arrival date & time: 12/24/19 1951  Chief Complaint(s) Exposure to STD  HPI Amanda [name] is a 19 y.o. female with a past medical history listed below who presents to the emergency department with persistent vaginal discharge.  Patient reports that she had chlamydia in February and was treated numerous times for persistent vaginal discharge.  She has not had a test of cure since.  She is endorsing pelvic discomfort.  States that she still sexually active with multiple partners and uses condoms.  Last sexual encounter was 2 weeks ago.  She denies any nausea or vomiting.  No fevers or chills.  No diarrhea.  No urinary symptoms.  Denies any other physical complaints.  HPI  Past Medical History Past Medical History:  Diagnosis Date  . Asthma   . Attention deficit hyperactivity disorder   . Depression   . Eczema   . Mood disorder ([place])   . Oppositional defiant disorder    Patient Active Problem List   Diagnosis Date Noted  . DMDD (disruptive mood dysregulation disorder) ([place]) 01/14/2014  . PTSD (post-traumatic stress disorder) 01/14/2014  . Deliberate self-cutting 01/14/2014  . Autism spectrum 01/14/2014  . ADHD (attention deficit hyperactivity disorder) 01/14/2014  . Aggression 01/14/2014  . Suicidal ideation 01/14/2014  . ODD (oppositional defiant disorder) 01/13/2014  . Oppositional defiant disorder 11/04/2013   Home Medication(s) Prior to Admission medications   Medication Sig Start Date End Date Taking? Authorizing Provider  albuterol (PROVENTIL HFA;VENTOLIN HFA) 108 (90 Base) MCG/ACT inhaler Inhale 1-2 puffs into the lungs every 6 (six) hours as needed for wheezing or shortness of breath.    [provider]  citalopram (CELEXA) 20 MG tablet Take 1 tablet (20 mg total) by mouth daily. Patient taking differently: Take 30 mg by mouth daily.  01/16/14   Winson, Manus Rudd, NP  dicyclomine (BENTYL)  20 MG tablet Take 1 tablet (20 mg total) by mouth every 12 (twelve) hours as needed (for abdominal pain/cramping). 12/06/19   Antonietta Breach, PA-C  hydrOXYzine (VISTARIL) 25 MG capsule Take 25 mg by mouth 3 (three) times daily as needed for anxiety.    [provider]  levothyroxine (SYNTHROID, LEVOTHROID) 50 MCG tablet Take 50 mcg by mouth daily before breakfast.    [provider]  lithium carbonate (ESKALITH) 450 MG CR tablet Take 1 tablet (450 mg total) by mouth 2 (two) times daily. Patient taking differently: Take 300 mg by mouth 3 (three) times daily.  01/16/14   Winson, Manus Rudd, NP  methocarbamol (ROBAXIN) 500 MG tablet Take 1 tablet (500 mg total) by mouth 2 (two) times daily. 12/18/19   Larene Pickett, PA-C  metroNIDAZOLE (FLAGYL) 500 MG tablet Take 1 tablet (500 mg total) by mouth 2 (two) times daily for 7 days. 12/25/19 01/01/20  Fatima Blank, MD  fluticasone (FLONASE) 50 MCG/ACT nasal spray Place 2 sprays into both nostrils daily. Patient not taking: Reported on 08/01/2019 12/15/18 08/01/19  Julianne Rice, MD  loratadine (CLARITIN) 10 MG tablet Take 1 tablet (10 mg total) by mouth daily. Patient not taking: Reported on 05/28/2019 12/15/18 08/01/19  Julianne Rice, MD  Past Surgical History Past Surgical History:  Procedure Laterality Date  . ADENOIDECTOMY    . TYMPANOSTOMY TUBE PLACEMENT     Family History No family history on file.  Social History Social History   Tobacco Use  . Smoking status: Never Smoker  . Smokeless tobacco: Never Used  Substance Use Topics  . Alcohol use: No  . Drug use: No   Allergies Poison ivy extract [poison ivy extract] and Sulfa antibiotics  Review of Systems Review of Systems All other systems are reviewed and are negative for acute change except as noted in the HPI  Physical Exam Vital  Signs  I have reviewed the triage vital signs BP (!) 123/93 (BP Location: Right Arm)   Pulse 99   Temp 98.4 F (36.9 C) (Oral)   Resp 16   Ht 5\' 6"  (1.676 m)   Wt 93.4 kg   SpO2 100%   BMI 33.25 kg/m   Physical Exam Vitals reviewed.  Constitutional:      General: She is not in acute distress.    Appearance: She is well-developed. She is not diaphoretic.  HENT:     Head: Normocephalic and atraumatic.     Right Ear: External ear normal.     Left Ear: External ear normal.     Nose: Nose normal.  Eyes:     General: No scleral icterus.    Conjunctiva/sclera: Conjunctivae normal.  Neck:     Trachea: Phonation normal.  Cardiovascular:     Rate and Rhythm: Normal rate and regular rhythm.  Pulmonary:     Effort: Pulmonary effort is normal. No respiratory distress.     Breath sounds: No stridor.  Abdominal:     General: There is no distension.     Tenderness: There is no abdominal tenderness.  Genitourinary:    Comments: Performed by , PA at the request of patient. Findings. Mucousy, thick yellowish discharge at the cervical os. No cervical friability. Moderate CMT. Musculoskeletal:        General: Normal range of motion.     Cervical back: Normal range of motion.  Neurological:     Mental Status: She is alert and oriented to person, place, and time.  Psychiatric:        Behavior: Behavior normal.     ED Results and Treatments Labs (all labs ordered are listed, but only abnormal results are displayed) Labs Reviewed  WET PREP, GENITAL - Abnormal; Notable for the following components:      Result Value   Clue Cells Wet Prep HPF POC PRESENT (*)    All other components within normal limits  I-STAT BETA HCG BLOOD, ED ([facility], [facility], [facility] ONLY)  GC/CHLAMYDIA PROBE AMP ([facility]) NOT AT [facility]                                                                                                                         EKG  EKG Interpretation  Date/Time:    Ventricular  Rate:    PR Interval:    QRS Duration:   QT Interval:    QTC Calculation:   R Axis:     Text Interpretation:        Radiology No results found.  Pertinent labs & imaging results that were available during my care of the patient were reviewed by me and considered in my medical decision making (see chart for details).  Medications Ordered in ED Medications  metroNIDAZOLE (FLAGYL) tablet 500 mg (has no administration in time range)                                                                                                                                    Procedures Procedures  (including critical care time)  Medical Decision Making / ED Course I have reviewed the nursing notes for this encounter and the patient's prior records (if available in EHR or on provided paperwork).   Hydia Grantz was evaluated in Emergency Department on 12/25/2019 for the symptoms described in the history of present illness. She was evaluated in the context of the global COVID-19 pandemic, which necessitated consideration that the patient might be at risk for infection with the SARS-CoV-2 virus that causes COVID-19. Institutional protocols and algorithms that pertain to the evaluation of patients at risk for COVID-19 are in a state of rapid change based on information released by regulatory bodies including the CDC and federal and state organizations. These policies and algorithms were followed during the patient's care in the ED.  Pelvic exam was not consistent with cervicitis.  Some signs of PID.  Wet prep positive for clue cells consistent with bacterial vaginosis.  GC/chlamydia swab sent.  Without evidence of cervicitis, will await the results of the GC chlamydia.  Patient given initial dose of Flagyl in the emergency department.  Beta-hCG negative.      Final Clinical Impression(s) / ED Diagnoses Final diagnoses:  BV (bacterial vaginosis)    The patient appears reasonably screened and/or  stabilized for discharge and I doubt any other medical condition or other [facility] requiring further screening, evaluation, or treatment in the ED at this time prior to discharge. Safe for discharge with strict return precautions.  Disposition: Discharge  Condition: Good  I have discussed the results, Dx and Tx plan with the patient/family who expressed understanding and agree(s) with the plan. Discharge instructions discussed at length. The patient/family was given strict return precautions who verbalized understanding of the instructions. No further questions at time of discharge.    ED Discharge Orders         Ordered    metroNIDAZOLE (FLAGYL) 500 MG tablet  2 times daily     12/25/19 0152           Follow Up: Primary care provider  Schedule an appointment as soon as possible for a visit      This chart was dictated using voice recognition software.  Despite  best efforts to proofread,  errors can occur which can change the documentation meaning.   Nira Conn, MD 12/25/19 [phone]

## 2019-12-25 LAB — GC/CHLAMYDIA PROBE AMP (~~LOC~~) NOT AT ARMC
Chlamydia: NEGATIVE
Comment: NEGATIVE
Comment: NORMAL
Neisseria Gonorrhea: NEGATIVE

## 2019-12-25 LAB — I-STAT BETA HCG BLOOD, ED (MC, WL, AP ONLY): I-stat hCG, quantitative: <5 m[IU]/mL (ref <5)

## 2019-12-25 LAB — WET PREP, GENITAL
Sperm: NONE SEEN
Trich, Wet Prep: NONE SEEN
WBC, Wet Prep HPF POC: NONE SEEN
Yeast Wet Prep HPF POC: NONE SEEN

## 2019-12-25 MED ORDER — METRONIDAZOLE 500 MG PO TABS: 500.0000 mg | ORAL_TABLET | Freq: Two times a day (BID) | ORAL | 0 refills | Status: AC

## 2019-12-25 MED ORDER — METRONIDAZOLE 500 MG PO TABS
500.0000 mg | ORAL_TABLET | Freq: Once | ORAL | Status: AC
  Administered 2019-12-25: 500 mg via ORAL
  Filled 2019-12-25: qty 1

## 2020-01-21 ENCOUNTER — Emergency Department (HOSPITAL_COMMUNITY)
Admission: EM | Admit: 2020-01-21 | Discharge: 2020-01-22 | Disposition: A | Discharge status: Home / Self Care | Payer: Medicaid Other | Attending: Emergency Medicine | Admitting: Emergency Medicine

## 2020-01-21 ENCOUNTER — Encounter (HOSPITAL_COMMUNITY): Payer: Self-pay

## 2020-01-21 ENCOUNTER — Other Ambulatory Visit: Payer: Self-pay

## 2020-01-21 DIAGNOSIS — R531 Weakness: Secondary | ICD-10-CM | POA: Diagnosis not present

## 2020-01-21 DIAGNOSIS — J45909 Unspecified asthma, uncomplicated: Secondary | ICD-10-CM | POA: Diagnosis not present

## 2020-01-21 DIAGNOSIS — F909 Attention-deficit hyperactivity disorder, unspecified type: Secondary | ICD-10-CM | POA: Diagnosis not present

## 2020-01-21 DIAGNOSIS — Z79899 Other long term (current) drug therapy: Secondary | ICD-10-CM | POA: Insufficient documentation

## 2020-01-21 DIAGNOSIS — N898 Other specified noninflammatory disorders of vagina: Secondary | ICD-10-CM | POA: Insufficient documentation

## 2020-01-21 LAB — URINALYSIS, ROUTINE W REFLEX MICROSCOPIC
Bilirubin Urine: NEGATIVE
Glucose, UA: NEGATIVE mg/dL
Hgb urine dipstick: NEGATIVE
Ketones, ur: NEGATIVE mg/dL
Nitrite: NEGATIVE
Protein, ur: NEGATIVE mg/dL
Specific Gravity, Urine: 1.026 (ref 1.005–1.030)
pH: 5 (ref 5.0–8.0)

## 2020-01-21 LAB — I-STAT BETA HCG BLOOD, ED (MC, WL, AP ONLY): I-stat hCG, quantitative: <5 m[IU]/mL (ref <5)

## 2020-01-21 MED ORDER — SODIUM CHLORIDE 0.9% FLUSH: 3.0000 mL | Freq: Once | INTRAVENOUS | Status: DC

## 2020-01-21 NOTE — ED Triage Notes (Signed)
Pt bib [facility] w/ c/o vaginal infection. Pt recently completed ABX course. Pt endorses generalized weakness. Pt received 1000mg  Tylenol from EMS. EMS VS:  136/82 98% RA 100.9 F HR 115 RR 20

## 2020-01-22 LAB — WET PREP, GENITAL
Sperm: NONE SEEN
Trich, Wet Prep: NONE SEEN
Yeast Wet Prep HPF POC: NONE SEEN

## 2020-01-22 LAB — COMPREHENSIVE METABOLIC PANEL
ALT: 18 U/L (ref 0–44)
AST: 20 U/L (ref 15–41)
Albumin: 3.9 g/dL (ref 3.5–5.0)
Alkaline Phosphatase: 56 U/L (ref 38–126)
Anion gap: 7 (ref 5–15)
BUN: 9 mg/dL (ref 6–20)
CO2: 24 mmol/L (ref 22–32)
Calcium: 9.3 mg/dL (ref 8.9–10.3)
Chloride: 110 mmol/L (ref 98–111)
Creatinine, Ser: 0.8 mg/dL (ref 0.44–1.00)
GFR calc Af Amer: >60 mL/min (ref >60)
GFR calc non Af Amer: >60 mL/min (ref >60)
Glucose, Bld: 116 mg/dL — ABNORMAL HIGH (ref 70–99)
Potassium: 3.9 mmol/L (ref 3.5–5.1)
Sodium: 141 mmol/L (ref 135–145)
Total Bilirubin: 0.6 mg/dL (ref 0.3–1.2)
Total Protein: 6.5 g/dL (ref 6.5–8.1)

## 2020-01-22 LAB — CBC WITH DIFFERENTIAL/PLATELET
Abs Immature Granulocytes: 0.04 10*3/uL (ref 0.00–0.07)
Basophils Absolute: 0 10*3/uL (ref 0.0–0.1)
Basophils Relative: 0 %
Eosinophils Absolute: 0 10*3/uL (ref 0.0–0.5)
Eosinophils Relative: 0 %
HCT: 39.7 % (ref 36.0–46.0)
Hemoglobin: 13.1 g/dL (ref 12.0–15.0)
Immature Granulocytes: 0 %
Lymphocytes Relative: 29 %
Lymphs Abs: 3.3 10*3/uL (ref 0.7–4.0)
MCH: 29.4 pg (ref 26.0–34.0)
MCHC: 33 g/dL (ref 30.0–36.0)
MCV: 89.2 fL (ref 80.0–100.0)
Monocytes Absolute: 0.8 10*3/uL (ref 0.1–1.0)
Monocytes Relative: 7 %
Neutro Abs: 7.3 10*3/uL (ref 1.7–7.7)
Neutrophils Relative %: 64 %
Platelets: 350 10*3/uL (ref 150–400)
RBC: 4.45 MIL/uL (ref 3.87–5.11)
RDW: 13.2 % (ref 11.5–15.5)
WBC: 11.5 10*3/uL — ABNORMAL HIGH (ref 4.0–10.5)
nRBC: 0 % (ref 0.0–0.2)

## 2020-01-22 LAB — LACTIC ACID, PLASMA: Lactic Acid, Venous: 1.4 mmol/L (ref 0.5–1.9)

## 2020-01-22 NOTE — Discharge Instructions (Addendum)
Please follow up with your primary care doctor if weakness continues and with your OB/GYN for persistent vaginal discharge.

## 2020-01-22 NOTE — ED Notes (Signed)
Assisted provider with pelvic exam

## 2020-01-22 NOTE — ED Provider Notes (Signed)
[facility] EMERGENCY DEPARTMENT Provider Note   CSN: [id number] Arrival date & time: 01/21/20  2202     History Chief Complaint  Patient presents with  . Vaginitis    George Keatts is a 19 y.o. female.  Patient to ED via EMS for "collapse" earlier this evening. She denies syncope, but got up to the bathroom and felt too weak to stand up. No pain. She started feeling mildly weak over the last 2 days. She has been eating and drinking normally. She also reports a vaginal discharge also over the last 2 days. She states EMS reported she had a fever of 100.9 on the way to the emergency department but no known fever at home. No vomiting, dysuria, SOB, chest pain.   The history is provided by the patient. No language interpreter was used.       Past Medical History:  Diagnosis Date  . Asthma   . Attention deficit hyperactivity disorder   . Depression   . Eczema   . Mood disorder (HCC)   . Oppositional defiant disorder     Patient Active Problem List   Diagnosis Date Noted  . DMDD (disruptive mood dysregulation disorder) (HCC) 01/14/2014  . PTSD (post-traumatic stress disorder) 01/14/2014  . Deliberate self-cutting 01/14/2014  . Autism spectrum 01/14/2014  . ADHD (attention deficit hyperactivity disorder) 01/14/2014  . Aggression 01/14/2014  . Suicidal ideation 01/14/2014  . ODD (oppositional defiant disorder) 01/13/2014  . Oppositional defiant disorder 11/04/2013    Past Surgical History:  Procedure Laterality Date  . ADENOIDECTOMY    . TYMPANOSTOMY TUBE PLACEMENT       OB History   No obstetric history on file.     History reviewed. No pertinent family history.  Social History   Tobacco Use  . Smoking status: Never Smoker  . Smokeless tobacco: Never Used  Substance Use Topics  . Alcohol use: No  . Drug use: No    Home Medications Prior to Admission medications   Medication Sig Start Date End Date Taking? Authorizing Provider   albuterol (PROVENTIL HFA;VENTOLIN HFA) 108 (90 Base) MCG/ACT inhaler Inhale 1-2 puffs into the lungs every 6 (six) hours as needed for wheezing or shortness of breath.    [provider]  citalopram (CELEXA) 20 MG tablet Take 1 tablet (20 mg total) by mouth daily. Patient taking differently: Take 30 mg by mouth daily.  01/16/14   Winson, Louie Bun, NP  dicyclomine (BENTYL) 20 MG tablet Take 1 tablet (20 mg total) by mouth every 12 (twelve) hours as needed (for abdominal pain/cramping). 12/06/19   Antony Madura, PA-C  hydrOXYzine (VISTARIL) 25 MG capsule Take 25 mg by mouth 3 (three) times daily as needed for anxiety.    [provider]  levothyroxine (SYNTHROID, LEVOTHROID) 50 MCG tablet Take 50 mcg by mouth daily before breakfast.    [provider]  lithium carbonate (ESKALITH) 450 MG CR tablet Take 1 tablet (450 mg total) by mouth 2 (two) times daily. Patient taking differently: Take 300 mg by mouth 3 (three) times daily.  01/16/14   Winson, Louie Bun, NP  methocarbamol (ROBAXIN) 500 MG tablet Take 1 tablet (500 mg total) by mouth 2 (two) times daily. 12/18/19   Garlon Hatchet, PA-C  fluticasone (FLONASE) 50 MCG/ACT nasal spray Place 2 sprays into both nostrils daily. Patient not taking: Reported on 08/01/2019 12/15/18 08/01/19  Loren Racer, MD  loratadine (CLARITIN) 10 MG tablet Take 1 tablet (10 mg total) by mouth  daily. Patient not taking: Reported on 05/28/2019 12/15/18 08/01/19  Julianne Rice, MD    Allergies    Poison ivy extract [poison ivy extract] and Sulfa antibiotics  Review of Systems   Review of Systems  Constitutional: Negative for chills and fever.  HENT: Negative.   Respiratory: Negative.  Negative for shortness of breath.   Cardiovascular: Negative.  Negative for chest pain.  Gastrointestinal: Negative.  Negative for nausea.  Genitourinary: Positive for vaginal discharge.  Musculoskeletal: Negative.   Skin: Negative.   Neurological: Positive for  weakness.    Physical Exam Updated Vital Signs BP 122/77 (BP Location: Right Arm)   Pulse 99   Temp 98.1 F (36.7 C) (Oral)   Resp 17   Ht 5' 6.5" (1.689 m)   Wt 88.9 kg   SpO2 99%   BMI 31.16 kg/m   Physical Exam Vitals and nursing note reviewed.  Constitutional:      Appearance: She is well-developed.  HENT:     Head: Normocephalic.  Cardiovascular:     Rate and Rhythm: Normal rate and regular rhythm.     Heart sounds: No murmur.  Pulmonary:     Effort: Pulmonary effort is normal.     Breath sounds: Normal breath sounds. No wheezing, rhonchi or rales.  Abdominal:     General: Bowel sounds are normal.     Palpations: Abdomen is soft.     Tenderness: There is no abdominal tenderness. There is no guarding or rebound.  Genitourinary:    Comments: Green cervical discharge present without CMT. No significant adnexal tenderness or mass. Exam limited, however, by body habitus. Musculoskeletal:        General: Normal range of motion.     Cervical back: Normal range of motion and neck supple.  Skin:    General: Skin is warm and dry.     Findings: No rash.  Neurological:     Mental Status: She is alert and oriented to person, place, and time.     ED Results / Procedures / Treatments   Labs (all labs ordered are listed, but only abnormal results are displayed) Labs Reviewed  COMPREHENSIVE METABOLIC PANEL - Abnormal; Notable for the following components:      Result Value   Glucose, Bld 116 (*)    All other components within normal limits  CBC WITH DIFFERENTIAL/PLATELET - Abnormal; Notable for the following components:   WBC 11.5 (*)    All other components within normal limits  URINALYSIS, ROUTINE W REFLEX MICROSCOPIC - Abnormal; Notable for the following components:   APPearance HAZY (*)    Leukocytes,Ua TRACE (*)    Bacteria, UA RARE (*)    All other components within normal limits  WET PREP, GENITAL  LACTIC ACID, PLASMA  LACTIC ACID, PLASMA  I-STAT BETA HCG  BLOOD, ED ([facility], [facility], [facility] ONLY)  GC/CHLAMYDIA PROBE AMP ([place]) NOT AT [facility]    EKG None  Radiology No results found.  Procedures Procedures (including critical care time)  Medications Ordered in ED Medications  sodium chloride flush (NS) 0.9 % injection 3 mL (has no administration in time range)    ED Course  I have reviewed the triage vital signs and the nursing notes.  Pertinent labs & imaging results that were available during my care of the patient were reviewed by me and considered in my medical decision making (see chart for details).    MDM Rules/Calculators/A&P  Patient to ED for evaluation of 2 days of generalized weakness resulting in "collapse" tonight, lowering herself to the floor due to weakness. She also reports vaginal discharge.   VSS on arrival. Afebrile. She is awake, alert, oriented. There is vaginal discharge present without pelvic tenderness. She has WBC's on wet prep but has recently been treated for BV, and reports negative GC/chlamydia testing one week ago. Will wait for culture result.   EKG NSR. No electrolyte imbalances. She does not demonstrate weakness on her exam. She is felt appropriate for discharge home and is encouraged to follow up with her PCP and with GYN. Final Clinical Impression(s) / ED Diagnoses Final diagnoses:  None   1. Generalized weakness 2. Vaginal discharge  Rx / DC Orders ED Discharge Orders    None       Elpidio Anis, PA-C 01/22/20 0541    Shon Baton, MD 01/22/20 [phone]

## 2020-01-23 LAB — GC/CHLAMYDIA PROBE AMP (~~LOC~~) NOT AT ARMC
Chlamydia: NEGATIVE
Comment: NEGATIVE
Comment: NORMAL
Neisseria Gonorrhea: NEGATIVE

## 2020-02-21 ENCOUNTER — Other Ambulatory Visit: Payer: Self-pay

## 2020-02-21 ENCOUNTER — Emergency Department (HOSPITAL_COMMUNITY)
Admission: EM | Admit: 2020-02-21 | Discharge: 2020-02-21 | Disposition: A | Discharge status: Home / Self Care | Payer: Medicaid Other | Attending: Emergency Medicine | Admitting: Emergency Medicine

## 2020-02-21 DIAGNOSIS — Z5321 Procedure and treatment not carried out due to patient leaving prior to being seen by health care provider: Secondary | ICD-10-CM | POA: Diagnosis not present

## 2020-02-21 DIAGNOSIS — K0889 Other specified disorders of teeth and supporting structures: Secondary | ICD-10-CM | POA: Diagnosis not present

## 2020-02-21 NOTE — ED Triage Notes (Signed)
Per EMS: Patient reports to the ED via EMS for dental pain. Patient reports the pain has been going on since last night at 9. Pt reports the pain is 10/10

## 2020-03-17 ENCOUNTER — Emergency Department (HOSPITAL_COMMUNITY)
Admission: EM | Admit: 2020-03-17 | Discharge: 2020-03-18 | Disposition: A | Discharge status: Home / Self Care | Payer: Medicaid Other | Attending: Emergency Medicine | Admitting: Emergency Medicine

## 2020-03-17 ENCOUNTER — Encounter (HOSPITAL_COMMUNITY): Payer: Self-pay

## 2020-03-17 ENCOUNTER — Other Ambulatory Visit: Payer: Self-pay

## 2020-03-17 DIAGNOSIS — R109 Unspecified abdominal pain: Secondary | ICD-10-CM | POA: Diagnosis present

## 2020-03-17 DIAGNOSIS — Z5321 Procedure and treatment not carried out due to patient leaving prior to being seen by health care provider: Secondary | ICD-10-CM | POA: Diagnosis not present

## 2020-03-17 LAB — BASIC METABOLIC PANEL
Anion gap: 9 (ref 5–15)
BUN: 14 mg/dL (ref 6–20)
CO2: 24 mmol/L (ref 22–32)
Calcium: 9.3 mg/dL (ref 8.9–10.3)
Chloride: 109 mmol/L (ref 98–111)
Creatinine, Ser: 0.88 mg/dL (ref 0.44–1.00)
GFR calc Af Amer: >60 mL/min (ref >60)
GFR calc non Af Amer: >60 mL/min (ref >60)
Glucose, Bld: 122 mg/dL — ABNORMAL HIGH (ref 70–99)
Potassium: 4.2 mmol/L (ref 3.5–5.1)
Sodium: 142 mmol/L (ref 135–145)

## 2020-03-17 LAB — URINALYSIS, ROUTINE W REFLEX MICROSCOPIC
Bilirubin Urine: NEGATIVE
Glucose, UA: NEGATIVE mg/dL
Ketones, ur: NEGATIVE mg/dL
Nitrite: POSITIVE — AB
Protein, ur: 30 mg/dL — AB
RBC / HPF: >50 RBC/hpf — ABNORMAL HIGH (ref 0–5)
Specific Gravity, Urine: 1.02 (ref 1.005–1.030)
WBC, UA: >50 WBC/hpf — ABNORMAL HIGH (ref 0–5)
pH: 5 (ref 5.0–8.0)

## 2020-03-17 LAB — CBC
HCT: 37.5 % (ref 36.0–46.0)
Hemoglobin: 12.7 g/dL (ref 12.0–15.0)
MCH: 30.5 pg (ref 26.0–34.0)
MCHC: 33.9 g/dL (ref 30.0–36.0)
MCV: 89.9 fL (ref 80.0–100.0)
Platelets: 327 10*3/uL (ref 150–400)
RBC: 4.17 MIL/uL (ref 3.87–5.11)
RDW: 12.7 % (ref 11.5–15.5)
WBC: 12.2 10*3/uL — ABNORMAL HIGH (ref 4.0–10.5)
nRBC: 0 % (ref 0.0–0.2)

## 2020-03-17 LAB — I-STAT BETA HCG BLOOD, ED (MC, WL, AP ONLY): I-stat hCG, quantitative: <5 m[IU]/mL (ref <5)

## 2020-03-17 NOTE — ED Triage Notes (Signed)
Pt sts right sided flank pan and burning when urinating.

## 2020-03-18 NOTE — ED Notes (Signed)
Pt eloped from waiting area. Called 3X.  

## 2020-05-05 ENCOUNTER — Ambulatory Visit (HOSPITAL_COMMUNITY)
Admission: EM | Admit: 2020-05-05 | Discharge: 2020-05-06 | Disposition: A | Discharge status: Home / Self Care | Payer: Medicaid Other | Attending: Psychiatry | Admitting: Psychiatry

## 2020-05-05 ENCOUNTER — Other Ambulatory Visit: Payer: Self-pay

## 2020-05-05 DIAGNOSIS — F909 Attention-deficit hyperactivity disorder, unspecified type: Secondary | ICD-10-CM | POA: Diagnosis not present

## 2020-05-05 DIAGNOSIS — F913 Oppositional defiant disorder: Secondary | ICD-10-CM | POA: Insufficient documentation

## 2020-05-05 DIAGNOSIS — F3130 Bipolar disorder, current episode depressed, mild or moderate severity, unspecified: Secondary | ICD-10-CM

## 2020-05-05 DIAGNOSIS — F319 Bipolar disorder, unspecified: Secondary | ICD-10-CM | POA: Insufficient documentation

## 2020-05-05 DIAGNOSIS — F419 Anxiety disorder, unspecified: Secondary | ICD-10-CM | POA: Diagnosis not present

## 2020-05-05 DIAGNOSIS — J45909 Unspecified asthma, uncomplicated: Secondary | ICD-10-CM | POA: Insufficient documentation

## 2020-05-05 DIAGNOSIS — Z20822 Contact with and (suspected) exposure to covid-19: Secondary | ICD-10-CM | POA: Diagnosis not present

## 2020-05-05 LAB — POCT URINE DRUG SCREEN - MANUAL ENTRY (I-SCREEN)
POC Amphetamine UR: NOT DETECTED
POC Buprenorphine (BUP): NOT DETECTED
POC Cocaine UR: NOT DETECTED
POC Marijuana UR: NOT DETECTED
POC Methadone UR: NOT DETECTED
POC Methamphetamine UR: NOT DETECTED
POC Morphine: NOT DETECTED
POC Oxazepam (BZO): NOT DETECTED
POC Oxycodone UR: NOT DETECTED
POC Secobarbital (BAR): NOT DETECTED

## 2020-05-05 LAB — POCT PREGNANCY, URINE: Preg Test, Ur: NEGATIVE

## 2020-05-05 LAB — POC SARS CORONAVIRUS 2 AG: SARS Coronavirus 2 Ag: NEGATIVE

## 2020-05-05 MED ORDER — HYDROXYZINE HCL 25 MG PO TABS
25.0000 mg | ORAL_TABLET | Freq: Three times a day (TID) | ORAL | Status: DC | PRN
  Administered 2020-05-06: 25 mg via ORAL
  Filled 2020-05-05: qty 1

## 2020-05-05 MED ORDER — HYDROXYZINE PAMOATE 25 MG PO CAPS: 25.0000 mg | ORAL_CAPSULE | Freq: Three times a day (TID) | ORAL | Status: DC | PRN

## 2020-05-05 MED ORDER — ACETAMINOPHEN 325 MG PO TABS
650.0000 mg | ORAL_TABLET | Freq: Four times a day (QID) | ORAL | Status: DC | PRN
  Administered 2020-05-05: 650 mg via ORAL
  Filled 2020-05-05: qty 2

## 2020-05-05 MED ORDER — ALUM & MAG HYDROXIDE-SIMETH 200-200-20 MG/5ML PO SUSP: 30.0000 mL | ORAL | Status: DC | PRN

## 2020-05-05 MED ORDER — MAGNESIUM HYDROXIDE 400 MG/5ML PO SUSP: 30.0000 mL | Freq: Every day | ORAL | Status: DC | PRN

## 2020-05-05 MED ORDER — HYDROXYZINE HCL 25 MG PO TABS
50.0000 mg | ORAL_TABLET | Freq: Once | ORAL | Status: AC
  Administered 2020-05-05: 50 mg via ORAL
  Filled 2020-05-05: qty 2

## 2020-05-05 NOTE — ED Notes (Signed)
Patient blood pressure is high, TTS, RN informed.

## 2020-05-05 NOTE — BH Assessment (Signed)
Clinician attempted to call pt's mother/legal guardian Waynetta Sandy [place], [phone]) however the call rang and went to voicemail. Clinician left a HIPPA compliant voice message and call back number.    Redmond Pulling, MS, [facility], [facility] Triage Specialist [phone].

## 2020-05-05 NOTE — ED Notes (Addendum)
Patient's blood pressure is high. TTS informed

## 2020-05-05 NOTE — BH Assessment (Addendum)
Daytime provider please call pt's mother/legal guardian Waynetta Sandy [place], [phone]) for additional information.   Per mother she will be in lectures all morning and unable to answer her phone, she will be available after 1pm on 05/06/2020.    Redmond Pulling, MS, [facility], [facility] Triage Specialist [phone]

## 2020-05-05 NOTE — ED Notes (Signed)
Pt's phone and charger, med's,  wallet and keys are stored in locker 30
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
## 2020-05-05 NOTE — ED Notes (Signed)
Pt. is emotional, crying over her fiance. She say that her fiance left her so she has a bad thought of hurting herself.

## 2020-05-06 ENCOUNTER — Other Ambulatory Visit: Payer: Self-pay

## 2020-05-06 ENCOUNTER — Encounter (HOSPITAL_COMMUNITY): Payer: Self-pay | Admitting: Emergency Medicine

## 2020-05-06 LAB — CBC WITH DIFFERENTIAL/PLATELET
Abs Immature Granulocytes: 0.05 10*3/uL (ref 0.00–0.07)
Basophils Absolute: 0 10*3/uL (ref 0.0–0.1)
Basophils Relative: 0 %
Eosinophils Absolute: 0 10*3/uL (ref 0.0–0.5)
Eosinophils Relative: 0 %
HCT: 39.8 % (ref 36.0–46.0)
Hemoglobin: 13.2 g/dL (ref 12.0–15.0)
Immature Granulocytes: 0 %
Lymphocytes Relative: 32 %
Lymphs Abs: 3.7 10*3/uL (ref 0.7–4.0)
MCH: 30 pg (ref 26.0–34.0)
MCHC: 33.2 g/dL (ref 30.0–36.0)
MCV: 90.5 fL (ref 80.0–100.0)
Monocytes Absolute: 0.7 10*3/uL (ref 0.1–1.0)
Monocytes Relative: 6 %
Neutro Abs: 7.2 10*3/uL (ref 1.7–7.7)
Neutrophils Relative %: 62 %
Platelets: 301 10*3/uL (ref 150–400)
RBC: 4.4 MIL/uL (ref 3.87–5.11)
RDW: 12.8 % (ref 11.5–15.5)
WBC: 11.7 10*3/uL — ABNORMAL HIGH (ref 4.0–10.5)
nRBC: 0 % (ref 0.0–0.2)

## 2020-05-06 LAB — TSH: TSH: 3.79 u[IU]/mL (ref 0.350–4.500)

## 2020-05-06 LAB — COMPREHENSIVE METABOLIC PANEL
ALT: 16 U/L (ref 0–44)
AST: 18 U/L (ref 15–41)
Albumin: 4.3 g/dL (ref 3.5–5.0)
Alkaline Phosphatase: 63 U/L (ref 38–126)
Anion gap: 12 (ref 5–15)
BUN: 16 mg/dL (ref 6–20)
CO2: 21 mmol/L — ABNORMAL LOW (ref 22–32)
Calcium: 9.9 mg/dL (ref 8.9–10.3)
Chloride: 105 mmol/L (ref 98–111)
Creatinine, Ser: 0.74 mg/dL (ref 0.44–1.00)
GFR calc Af Amer: >60 mL/min (ref >60)
GFR calc non Af Amer: >60 mL/min (ref >60)
Glucose, Bld: 84 mg/dL (ref 70–99)
Potassium: 3.4 mmol/L — ABNORMAL LOW (ref 3.5–5.1)
Sodium: 138 mmol/L (ref 135–145)
Total Bilirubin: 0.8 mg/dL (ref 0.3–1.2)
Total Protein: 7.6 g/dL (ref 6.5–8.1)

## 2020-05-06 LAB — LIPID PANEL
Cholesterol: 185 mg/dL (ref 0–200)
HDL: 40 mg/dL — ABNORMAL LOW (ref >40)
LDL Cholesterol: 125 mg/dL — ABNORMAL HIGH (ref 0–99)
Total CHOL/HDL Ratio: 4.6 RATIO
Triglycerides: 100 mg/dL (ref <150)
VLDL: 20 mg/dL (ref 0–40)

## 2020-05-06 LAB — HEMOGLOBIN A1C
Hgb A1c MFr Bld: 5 % (ref 4.8–5.6)
Mean Plasma Glucose: 96.8 mg/dL

## 2020-05-06 MED ORDER — ALBUTEROL SULFATE HFA 108 (90 BASE) MCG/ACT IN AERS: 1.0000 | INHALATION_SPRAY | Freq: Four times a day (QID) | RESPIRATORY_TRACT | Status: DC | PRN

## 2020-05-06 MED ORDER — CITALOPRAM HYDROBROMIDE 20 MG PO TABS: 20.0000 mg | ORAL_TABLET | Freq: Every day | ORAL | Status: DC

## 2020-05-06 MED ORDER — ARIPIPRAZOLE ER 400 MG IM SRER: 200.0000 mg | INTRAMUSCULAR | Status: DC

## 2020-05-06 NOTE — BH Assessment (Signed)
Per Dr.  Lucianne Muss, MD the patient has been psychiatrically cleared for discharge. The patients recommended to continue to follow up with her [facility] for outpatient psychiatric services.     Per notes in chart, patient's mother Jene Every [phone]) is also her legal guardian. CSW attempted to contact the patient's mother to inform her of the updated disposition. There was no answer, CSW left a HIPAA compliant voicemail.    CSW contacted the patient's [facility] [phone] to inform them of the patient's discharge.   Per ACT[facility] they cannot pick the patient up for discharge, however her services will continue once she returns home.   CSW will continue to follow for an appropriate discharge.    Dr. Lucianne Muss, MD, Reola Calkins, NP and Cecile Sheerer, RN notified.     Baldo Daub, MSW, LCSW Lead Clinical Social Worker [place] [facility]

## 2020-05-06 NOTE — ED Notes (Signed)
Pt A&O x 4, tearful, presents with SI after break up with boyfriend of 3 mos,  Pt has a history of cutting and previous SI attempts.  Skin search completed, monitoring for safety, pt calm & cooperative at present, no distress noted.

## 2020-05-06 NOTE — ED Notes (Signed)
[place] giving to patient.

## 2020-05-06 NOTE — Discharge Instructions (Addendum)
Continue current home medications Take medications as prescribed Keep scheduled appointments with ACT services

## 2020-05-06 NOTE — ED Triage Notes (Signed)
Pt presents with SI after break up with boyfriend of 3 mos.

## 2020-05-06 NOTE — ED Notes (Signed)
Pt discharged in no acute distress. Verbalized understanding of all instructions reviewed by staff. Unsuccessful attempts made to pt's legal guardian at time of discharge. Safety maintained.

## 2020-05-06 NOTE — Progress Notes (Signed)
CSW contacted [facility] [phone]) and informed them pt was being discharged today. They requested pt's discharge summary be faxed to [phone] and stated that they do not provide transportation for pt's to get home, but reported, "she will be fine if given a bus pass". [facility] provider notified.    Wells Guiles, LCSW, LCAS Disposition CSW [facility] [phone]

## 2020-05-06 NOTE — Progress Notes (Signed)
CSW attempted for the second time, to contact the patient's mother, Jene Every [phone]) to inform her of the patient's discharge.   There was no answer. CSW will continue to attempt to contact the patient's mother.        Baldo Daub, MSW, LCSW Clinical Social Worker [place] [facility]
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
## 2020-05-06 NOTE — ED Notes (Signed)
Patient received breakfast;cereal

## 2020-05-06 NOTE — ED Notes (Signed)
Pt sleeping at present, no distress noted, monitoring for safety. 
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
## 2020-05-06 NOTE — ED Notes (Signed)
Patient ambulating on unit and interacting with peers and staff. Monitoring continues and safety maintained.

## 2020-05-06 NOTE — ED Notes (Signed)
Patient denies SI/HI and A/V/H. Patient tearful about breakup of relationship with her boyfriend. Patient given support and encouragement. Monitoring continues. Patient given coke to drink declined food offer.

## 2020-06-01 ENCOUNTER — Encounter (HOSPITAL_COMMUNITY): Payer: Self-pay

## 2020-06-01 ENCOUNTER — Inpatient Hospital Stay (HOSPITAL_COMMUNITY)
Admission: AD | Admit: 2020-06-01 | Discharge: 2020-06-04 | DRG: 885 | Disposition: A | Discharge status: Home / Self Care | Payer: Medicaid Other | Attending: Psychiatry | Admitting: Psychiatry

## 2020-06-01 DIAGNOSIS — Z79899 Other long term (current) drug therapy: Secondary | ICD-10-CM

## 2020-06-01 DIAGNOSIS — F840 Autistic disorder: Secondary | ICD-10-CM | POA: Diagnosis present

## 2020-06-01 DIAGNOSIS — Z23 Encounter for immunization: Secondary | ICD-10-CM | POA: Diagnosis not present

## 2020-06-01 DIAGNOSIS — F909 Attention-deficit hyperactivity disorder, unspecified type: Secondary | ICD-10-CM | POA: Diagnosis present

## 2020-06-01 DIAGNOSIS — E039 Hypothyroidism, unspecified: Secondary | ICD-10-CM | POA: Diagnosis present

## 2020-06-01 DIAGNOSIS — Z915 Personal history of self-harm: Secondary | ICD-10-CM

## 2020-06-01 DIAGNOSIS — F603 Borderline personality disorder: Secondary | ICD-10-CM | POA: Diagnosis present

## 2020-06-01 DIAGNOSIS — F913 Oppositional defiant disorder: Secondary | ICD-10-CM | POA: Diagnosis present

## 2020-06-01 DIAGNOSIS — G479 Sleep disorder, unspecified: Secondary | ICD-10-CM | POA: Diagnosis present

## 2020-06-01 DIAGNOSIS — F319 Bipolar disorder, unspecified: Secondary | ICD-10-CM | POA: Diagnosis present

## 2020-06-01 DIAGNOSIS — F4310 Post-traumatic stress disorder, unspecified: Secondary | ICD-10-CM | POA: Diagnosis present

## 2020-06-01 DIAGNOSIS — F3481 Disruptive mood dysregulation disorder: Secondary | ICD-10-CM | POA: Diagnosis present

## 2020-06-01 DIAGNOSIS — R45851 Suicidal ideations: Secondary | ICD-10-CM | POA: Diagnosis present

## 2020-06-01 DIAGNOSIS — Z20822 Contact with and (suspected) exposure to covid-19: Secondary | ICD-10-CM | POA: Diagnosis present

## 2020-06-01 LAB — SARS CORONAVIRUS 2 BY RT PCR (HOSPITAL ORDER, PERFORMED IN ~~LOC~~ HOSPITAL LAB): SARS Coronavirus 2: NEGATIVE

## 2020-06-01 MED ORDER — LITHIUM CARBONATE 300 MG PO CAPS
450.0000 mg | ORAL_CAPSULE | Freq: Two times a day (BID) | ORAL | Status: DC
  Filled 2020-06-01 (×5): qty 1

## 2020-06-01 MED ORDER — MAGNESIUM HYDROXIDE 400 MG/5ML PO SUSP
30.0000 mL | Freq: Every day | ORAL | Status: DC | PRN
  Administered 2020-06-02: 30 mL via ORAL
  Filled 2020-06-01: qty 30

## 2020-06-01 MED ORDER — MELATONIN 5 MG PO TABS
5.0000 mg | ORAL_TABLET | Freq: Every day | ORAL | Status: DC
  Administered 2020-06-02 – 2020-06-03 (×2): 5 mg via ORAL
  Filled 2020-06-01 (×2): qty 1
  Filled 2020-06-01: qty 7

## 2020-06-01 MED ORDER — LEVOTHYROXINE SODIUM 50 MCG PO TABS
50.0000 ug | ORAL_TABLET | Freq: Every day | ORAL | Status: DC
  Administered 2020-06-02 – 2020-06-04 (×3): 50 ug via ORAL
  Filled 2020-06-01: qty 7
  Filled 2020-06-01: qty 2
  Filled 2020-06-01 (×4): qty 1

## 2020-06-01 MED ORDER — LORAZEPAM 1 MG PO TABS
2.0000 mg | ORAL_TABLET | Freq: Once | ORAL | Status: AC
  Administered 2020-06-01: 2 mg via ORAL

## 2020-06-01 MED ORDER — LORAZEPAM 1 MG PO TABS
ORAL_TABLET | ORAL | Status: AC
  Filled 2020-06-01: qty 2

## 2020-06-01 MED ORDER — ALBUTEROL SULFATE HFA 108 (90 BASE) MCG/ACT IN AERS
INHALATION_SPRAY | RESPIRATORY_TRACT | Status: AC
  Filled 2020-06-01: qty 6.7

## 2020-06-01 MED ORDER — HYDROXYZINE HCL 25 MG PO TABS
25.0000 mg | ORAL_TABLET | Freq: Three times a day (TID) | ORAL | Status: DC | PRN
  Administered 2020-06-02 – 2020-06-03 (×3): 25 mg via ORAL
  Filled 2020-06-01: qty 1
  Filled 2020-06-01: qty 10
  Filled 2020-06-01 (×2): qty 1

## 2020-06-01 MED ORDER — ACETAMINOPHEN 325 MG PO TABS
650.0000 mg | ORAL_TABLET | Freq: Four times a day (QID) | ORAL | Status: DC | PRN
  Administered 2020-06-02 – 2020-06-04 (×3): 650 mg via ORAL
  Filled 2020-06-01 (×3): qty 2

## 2020-06-01 MED ORDER — CITALOPRAM HYDROBROMIDE 20 MG PO TABS
20.0000 mg | ORAL_TABLET | Freq: Every day | ORAL | Status: DC
  Administered 2020-06-04: 20 mg via ORAL
  Filled 2020-06-01 (×2): qty 1
  Filled 2020-06-01: qty 7
  Filled 2020-06-01 (×2): qty 1

## 2020-06-01 MED ORDER — PNEUMOCOCCAL VAC POLYVALENT 25 MCG/0.5ML IJ INJ
0.5000 mL | INJECTION | INTRAMUSCULAR | Status: AC
  Administered 2020-06-03: 0.5 mL via INTRAMUSCULAR
  Filled 2020-06-01: qty 0.5

## 2020-06-01 MED ORDER — ALUM & MAG HYDROXIDE-SIMETH 200-200-20 MG/5ML PO SUSP: 30.0000 mL | ORAL | Status: DC | PRN
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
## 2020-06-01 NOTE — Tx Team (Signed)
Initial Treatment Plan 06/01/2020 11:36 PM Amanda [name] EXN:[id number]    PATIENT STRESSORS: Marital or family conflict   PATIENT STRENGTHS: Active sense of humor Communication skills Special hobby/interest   PATIENT IDENTIFIED PROBLEMS: Depression  Anxiety  Suicidal Ideation        "get better"  'get medication right"       DISCHARGE CRITERIA:  Improved stabilization in mood, thinking, and/or behavior Motivation to continue treatment in a less acute level of care Need for constant or close observation no longer present Verbal commitment to aftercare and medication compliance  PRELIMINARY DISCHARGE PLAN: Outpatient therapy Return to previous living arrangement  PATIENT/FAMILY INVOLVEMENT: This treatment plan has been presented to and reviewed with the patient, Amanda [name].  The patient and family have been given the opportunity to ask questions and make suggestions.  Juliann Pares, RN 06/01/2020, 11:36 PM

## 2020-06-01 NOTE — Progress Notes (Signed)
°   06/01/20 2321  Psych Admission Type (Psych Patients Only)  Admission Status Voluntary  Psychosocial Assessment  Patient Complaints Anxiety;Insomnia  Eye Contact Fair  Facial Expression Anxious;Animated  Affect Apprehensive;Appropriate to circumstance;Anxious;Depressed  Speech Journalist, newspaper;Restless  Appearance/Hygiene In hospital gown  Behavior Characteristics Appropriate to situation  Mood Anxious;Pleasant  Thought Process  Content WDL  Delusions None reported or observed  Perception WDL  Hallucination None reported or observed  Judgment Poor  Confusion None  Danger to Self  Current suicidal ideation? Passive  Self-Injurious Behavior No self-injurious ideation or behavior indicators observed or expressed   Agreement Not to Harm Self Yes  Description of Agreement verbal  Danger to Others  Danger to Others None reported or observed

## 2020-06-01 NOTE — BH Assessment (Signed)
Pt's mother, Leafy Kindle [phone], informed staff in Patient Access that she is not Pt's legal guardian.    Pamalee Leyden, [facility], [facility] Triage Specialist [phone]

## 2020-06-02 ENCOUNTER — Other Ambulatory Visit: Payer: Self-pay

## 2020-06-02 DIAGNOSIS — F319 Bipolar disorder, unspecified: Secondary | ICD-10-CM | POA: Diagnosis not present

## 2020-06-02 DIAGNOSIS — F4310 Post-traumatic stress disorder, unspecified: Secondary | ICD-10-CM

## 2020-06-02 DIAGNOSIS — F909 Attention-deficit hyperactivity disorder, unspecified type: Secondary | ICD-10-CM

## 2020-06-02 DIAGNOSIS — F840 Autistic disorder: Secondary | ICD-10-CM

## 2020-06-02 DIAGNOSIS — R45851 Suicidal ideations: Secondary | ICD-10-CM

## 2020-06-02 LAB — CBC
HCT: 39.6 % (ref 36.0–46.0)
Hemoglobin: 13.2 g/dL (ref 12.0–15.0)
MCH: 29.7 pg (ref 26.0–34.0)
MCHC: 33.3 g/dL (ref 30.0–36.0)
MCV: 89.2 fL (ref 80.0–100.0)
Platelets: 277 10*3/uL (ref 150–400)
RBC: 4.44 MIL/uL (ref 3.87–5.11)
RDW: 12.4 % (ref 11.5–15.5)
WBC: 8.6 10*3/uL (ref 4.0–10.5)
nRBC: 0 % (ref 0.0–0.2)

## 2020-06-02 LAB — HEPATIC FUNCTION PANEL
ALT: 15 U/L (ref 0–44)
AST: 17 U/L (ref 15–41)
Albumin: 4.3 g/dL (ref 3.5–5.0)
Alkaline Phosphatase: 54 U/L (ref 38–126)
Bilirubin, Direct: <0.1 mg/dL (ref 0.0–0.2)
Total Bilirubin: 0.5 mg/dL (ref 0.3–1.2)
Total Protein: 7.4 g/dL (ref 6.5–8.1)

## 2020-06-02 LAB — LITHIUM LEVEL: Lithium Lvl: <0.06 mmol/L — ABNORMAL LOW (ref 0.60–1.20)

## 2020-06-02 LAB — LIPID PANEL
Cholesterol: 148 mg/dL (ref 0–200)
HDL: 38 mg/dL — ABNORMAL LOW (ref >40)
LDL Cholesterol: 87 mg/dL (ref 0–99)
Total CHOL/HDL Ratio: 3.9 RATIO
Triglycerides: 117 mg/dL (ref <150)
VLDL: 23 mg/dL (ref 0–40)

## 2020-06-02 LAB — COMPREHENSIVE METABOLIC PANEL
ALT: 15 U/L (ref 0–44)
AST: 17 U/L (ref 15–41)
Albumin: 4.2 g/dL (ref 3.5–5.0)
Alkaline Phosphatase: 55 U/L (ref 38–126)
Anion gap: 11 (ref 5–15)
BUN: 21 mg/dL — ABNORMAL HIGH (ref 6–20)
CO2: 23 mmol/L (ref 22–32)
Calcium: 9.8 mg/dL (ref 8.9–10.3)
Chloride: 105 mmol/L (ref 98–111)
Creatinine, Ser: 0.72 mg/dL (ref 0.44–1.00)
GFR calc Af Amer: >60 mL/min (ref >60)
GFR calc non Af Amer: >60 mL/min (ref >60)
Glucose, Bld: 102 mg/dL — ABNORMAL HIGH (ref 70–99)
Potassium: 4.2 mmol/L (ref 3.5–5.1)
Sodium: 139 mmol/L (ref 135–145)
Total Bilirubin: 0.4 mg/dL (ref 0.3–1.2)
Total Protein: 7.4 g/dL (ref 6.5–8.1)

## 2020-06-02 LAB — TSH: TSH: 3.1 u[IU]/mL (ref 0.350–4.500)

## 2020-06-02 LAB — ETHANOL: Alcohol, Ethyl (B): <10 mg/dL (ref <10)

## 2020-06-02 MED ORDER — OXCARBAZEPINE 150 MG PO TABS
150.0000 mg | ORAL_TABLET | Freq: Two times a day (BID) | ORAL | Status: DC
  Administered 2020-06-02: 150 mg via ORAL
  Filled 2020-06-02 (×5): qty 1

## 2020-06-02 MED ORDER — ALBUTEROL SULFATE HFA 108 (90 BASE) MCG/ACT IN AERS
1.0000 | INHALATION_SPRAY | Freq: Four times a day (QID) | RESPIRATORY_TRACT | Status: DC | PRN
  Administered 2020-06-02 (×2): 2 via RESPIRATORY_TRACT
  Administered 2020-06-03: 1 via RESPIRATORY_TRACT
  Filled 2020-06-02: qty 6.7

## 2020-06-02 NOTE — Progress Notes (Signed)
Recreation Therapy Notes  Date: 9.13.21 Time: 0930 Location: [address] Dayroom  Group Topic: Stress Management  Goal Area(s) Addresses:  Patient will identify positive stress management techniques. Patient will identify benefits of using stress management post d/c.  Behavioral Response: Engaged  Intervention: Stress Management  Activity: Meditation.  LRT played a meditation that focused on being more resilient in the face adversity.  Patients were to listen and follow along as the meditation played to engage in meditation.    Education:  Stress Management, Discharge Planning.   Education Outcome: Acknowledges Education  Clinical Observations/Feedback: Pt attended and participated in activity.    Mane Consolo, LRT/CTRS        Tiaria Biby A 06/02/2020 11:43 AM 
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
## 2020-06-02 NOTE — Progress Notes (Signed)
Patient rated her day as a 9 out of 10 since she had a good day overall. She states that it felt good to be around others. She did mention that her ACTT team is not doing a good job with her on an outpatient basis. Her goal for tomorrow is to get better and to continue taking new medication.

## 2020-06-02 NOTE — H&P (Signed)
Psychiatric Admission Assessment Adult  Patient Identification: Amanda [name] MRN:  [id number] Date of Evaluation:  06/02/2020 Chief Complaint:  Bipolar 1 disorder (HCC) [F31.9] Principal Diagnosis: <principal problem not specified> Diagnosis:  Active Problems:   PTSD (post-traumatic stress disorder)   Autism spectrum   ADHD (attention deficit hyperactivity disorder)   Suicidal ideation   Bipolar 1 disorder (HCC)  History of Present Illness: Patient is seen and examined.  Patient is a 19 year old female with a reported past psychiatric history significant for bipolar disorder and attention deficit hyperactivity disorder who presented to the [facility] on 06/01/2020 with her act team counselor.  Patient stated she was currently suicidal and had a plan to overdose on her medications.  She lives alone in an apartment.  She had apparently attempted suicide by overdosing on medications approximately 6 years ago.  She stated the reason why she wanted to die which she had been having a break-up with a significant other over period of time, and she was unclear on that.  During the interview today she was significantly sedated.  She stated the reason why she was admitted was to get started on "Ativan and a weight loss drug".  She denied any auditory or visual hallucinations.  She denied any drug use but did admit to alcohol use.  On admission she was placed on Celexa as well as lithium, but she refused those this morning and stated "those do not work for me".  She was admitted to the hospital for evaluation and stabilization.  Her last admission to our system apparently was in 2015.  At that time she was 19 years old and had been admitted from a group home because she was breaking windows and kicking holes in walls.  She had also attempted to hang herself with a bra.  At that time she had a history of bipolar disorder, depression, PTSD, oppositional defiant disorder and attention  deficit hyperactivity disorder.  In her admission note at that time she had had 2 previous behavioral health hospital admissions, previous admissions to old [address], [address], [address] [facility], [place].  She apparently had been seen in the emergency department on 05/06/2020.  Her complaint was the same as at that time.  The note stated that she had contacted her act team and they had recommended that she come in for evaluation.  She apparently receives Abilify maintain him monthly, and takes hydroxyzine and Zofran.  The patient was discharged from the ED to continue with [facility].  She was admitted for evaluation and stabilization on this visit.  Associated Signs/Symptoms: Depression Symptoms:  depressed mood, anhedonia, psychomotor retardation, fatigue, feelings of worthlessness/guilt, difficulty concentrating, hopelessness, suicidal thoughts without plan, anxiety, loss of energy/fatigue, (Hypo) Manic Symptoms:  Impulsivity, Irritable Mood, Labiality of Mood, Anxiety Symptoms:  Excessive Worry, Psychotic Symptoms:  denied PTSD Symptoms: Negative Total Time spent with patient: 45 minutes  Past Psychiatric History: Old record reveals diagnosis of bipolar disorder, depression, PTSD, oppositional defiant disorder and attention deficit hyperactivity disorder.  In her admission note at that time she had had 2 previous behavioral health hospital admissions, previous admissions to old [address], [address], [address] [facility], [place].  Is the patient at risk to self? Yes.    Has the patient been a risk to self in the past 6 months? Yes.    Has the patient been a risk to self within the distant past? Yes.    Is the patient a risk to others? No.  Has the patient been a risk to others in the past 6 months? No.  Has the patient been a risk to others within the distant past? No.   Prior Inpatient Therapy: Prior Inpatient Therapy: Yes Prior Therapy Dates: 2016, multiple  admits Prior Therapy Facilty/Provider(s): [facility] Reason for Treatment: Bipolar disorder Prior Outpatient Therapy: Prior Outpatient Therapy: Yes Prior Therapy Dates: Current Prior Therapy Facilty/Provider(s): [facility]. Malvin [facility] and [facility] Reason for Treatment: Bipolar disorder Does patient have an ACCT team?: Yes ([facility]) Does patient have Intensive In-House Services?  : No Does patient have [facility] services? : No Does patient have P4CC services?: No  Alcohol Screening: 1. How often do you have a drink containing alcohol?: Monthly or less 2. How many drinks containing alcohol do you have on a typical day when you are drinking?: 3 or 4 3. How often do you have six or more drinks on one occasion?: Never AUDIT-C Score: 2 4. How often during the last year have you found that you were not able to stop drinking once you had started?: Never 5. How often during the last year have you failed to do what was normally expected from you because of drinking?: Never 6. How often during the last year have you needed a first drink in the morning to get yourself going after a heavy drinking session?: Never 7. How often during the last year have you had a feeling of guilt of remorse after drinking?: Never 8. How often during the last year have you been unable to remember what happened the night before because you had been drinking?: Never 9. Have you or someone else been injured as a result of your drinking?: No 10. Has a relative or friend or a doctor or another health worker been concerned about your drinking or suggested you cut down?: No Alcohol Use Disorder Identification Test Final Score (AUDIT): 2 Alcohol Brief Interventions/Follow-up: AUDIT Score <7 follow-up not indicated Substance Abuse History in the last 12 months:  No. Consequences of Substance Abuse: Negative Previous Psychotropic Medications: Yes  Psychological Evaluations: Yes  Past Medical History:  Past Medical History:   Diagnosis Date  . Asthma   . Attention deficit hyperactivity disorder   . Depression   . Eczema   . Mood disorder (HCC)   . Oppositional defiant disorder     Past Surgical History:  Procedure Laterality Date  . ADENOIDECTOMY    . TYMPANOSTOMY TUBE PLACEMENT     Family History: History reviewed. No pertinent family history. Family Psychiatric  History: Patient unable to answer at this point. Tobacco Screening: Have you used any form of tobacco in the last 30 days? (Cigarettes, Smokeless Tobacco, Cigars, and/or Pipes): No Social History:  Social History   Substance and Sexual Activity  Alcohol Use No     Social History   Substance and Sexual Activity  Drug Use No    Additional Social History: Marital status: Single    Pain Medications: See MAR Prescriptions: See MAR Over the Counter: See MAR History of alcohol / drug use?: Yes (Infrequent use of alcohol. Reports she has tried marijuana three times in the past.) Longest period of sobriety (when/how long): NA                    Allergies:   Allergies  Allergen Reactions  . Poison Ivy Extract [Poison Ivy Extract] Hives, Itching and Rash  . Sulfa Antibiotics Itching, Swelling and Rash   Lab Results:  Results for  orders placed or performed during the hospital encounter of 06/01/20 (from the past 48 hour(s))  SARS Coronavirus 2 by RT PCR (hospital order, performed in [facility] hospital lab) Nasopharyngeal Nasopharyngeal Swab     Status: None   Collection Time: 06/01/20  9:17 PM   Specimen: Nasopharyngeal Swab  Result Value Ref Range   SARS Coronavirus 2 NEGATIVE NEGATIVE    Comment: (NOTE) SARS-CoV-2 target nucleic acids are NOT DETECTED.  The SARS-CoV-2 RNA is generally detectable in upper and lower respiratory specimens during the acute phase of infection. The lowest concentration of SARS-CoV-2 viral copies this assay can detect is 250 copies / mL. A negative result does not preclude SARS-CoV-2  infection and should not be used as the sole basis for treatment or other patient management decisions.  A negative result may occur with improper specimen collection / handling, submission of specimen other than nasopharyngeal swab, presence of viral mutation(s) within the areas targeted by this assay, and inadequate number of viral copies (<250 copies / mL). A negative result must be combined with clinical observations, patient history, and epidemiological information.  Fact Sheet for Patients:   BoilerBrush.com.cy  Fact Sheet for Healthcare Providers: https://pope.com/  This test is not yet approved or  cleared by the [place] FDA and has been authorized for detection and/or diagnosis of SARS-CoV-2 by FDA under an Emergency Use Authorization (EUA).  This EUA will remain in effect (meaning this test can be used) for the duration of the COVID-19 declaration under Section 564(b)(1) of the Act, 21 U.S.C. section 360bbb-3(b)(1), unless the authorization is terminated or revoked sooner.  Performed at [facility], [address], [place], [place] [address]   CBC     Status: None   Collection Time: 06/02/20  6:33 AM  Result Value Ref Range   WBC 8.6 4.0 - 10.5 K/uL   RBC 4.44 3.87 - 5.11 MIL/uL   Hemoglobin 13.2 12.0 - 15.0 g/dL   HCT 02.7 36 - 46 %   MCV 89.2 80.0 - 100.0 fL   MCH 29.7 26.0 - 34.0 pg   MCHC 33.3 30.0 - 36.0 g/dL   RDW 74.1 28.7 - 86.7 %   Platelets 277 150 - 400 K/uL   nRBC 0.0 0.0 - 0.2 %    Comment: Performed at [facility], [address], [place], [place] [address]  Comprehensive metabolic panel     Status: Abnormal   Collection Time: 06/02/20  6:33 AM  Result Value Ref Range   Sodium 139 135 - 145 mmol/L   Potassium 4.2 3.5 - 5.1 mmol/L   Chloride 105 98 - 111 mmol/L   CO2 23 22 - 32 mmol/L   Glucose, Bld 102 (H) 70 - 99 mg/dL    Comment: Glucose reference  range applies only to samples taken after fasting for at least 8 hours.   BUN 21 (H) 6 - 20 mg/dL   Creatinine, Ser 4.70 0.44 - 1.00 mg/dL   Calcium 9.8 8.9 - 96.2 mg/dL   Total Protein 7.4 6.5 - 8.1 g/dL   Albumin 4.2 3.5 - 5.0 g/dL   AST 17 15 - 41 U/L   ALT 15 0 - 44 U/L   Alkaline Phosphatase 55 38 - 126 U/L   Total Bilirubin 0.4 0.3 - 1.2 mg/dL   GFR calc non Af Amer >60 >60 mL/min   GFR calc Af Amer >60 >60 mL/min   Anion gap 11 5 - 15  Comment: Performed at [facility], [address] [place], [place] [address]  Ethanol     Status: None   Collection Time: 06/02/20  6:33 AM  Result Value Ref Range   Alcohol, Ethyl (B) <10 <10 mg/dL    Comment: (NOTE) Lowest detectable limit for serum alcohol is 10 mg/dL.  For medical purposes only. Performed at [facility], [address] [place], [place] [address]   Lipid panel     Status: Abnormal   Collection Time: 06/02/20  6:33 AM  Result Value Ref Range   Cholesterol 148 0 - 200 mg/dL   Triglycerides 829117 <562<150 mg/dL   HDL 38 (L) >13>40 mg/dL   Total CHOL/HDL Ratio 3.9 RATIO   VLDL 23 0 - 40 mg/dL   LDL Cholesterol 87 0 - 99 mg/dL    Comment:        Total Cholesterol/HDL:CHD Risk Coronary Heart Disease Risk Table                     Men   Women  1/2 Average Risk   3.4   3.3  Average Risk       5.0   4.4  2 X Average Risk   9.6   7.1  3 X Average Risk  23.4   11.0        Use the calculated Patient Ratio above and the CHD Risk Table to determine the patient's CHD Risk.        ATP III CLASSIFICATION (LDL):  <100     mg/dL   Optimal  [id number]  mg/dL   Near or Above                    Optimal  130-159  mg/dL   Borderline  [id number]  mg/dL   High  >528>190     mg/dL   Very High Performed at [facility], [address] [place], [place] [address]   Hepatic function panel     Status: None   Collection Time: 06/02/20  6:33 AM  Result Value Ref Range   Total Protein 7.4 6.5 - 8.1 g/dL    Albumin 4.3 3.5 - 5.0 g/dL   AST 17 15 - 41 U/L   ALT 15 0 - 44 U/L   Alkaline Phosphatase 54 38 - 126 U/L   Total Bilirubin 0.5 0.3 - 1.2 mg/dL   Bilirubin, Direct <4.0<0.1 0.0 - 0.2 mg/dL   Indirect Bilirubin NOT CALCULATED 0.3 - 0.9 mg/dL    Comment: Performed at [facility], [address] [place], [place] [address]  TSH     Status: None   Collection Time: 06/02/20  6:33 AM  Result Value Ref Range   TSH 3.100 0.350 - 4.500 uIU/mL    Comment: Performed by a 3rd Generation assay with a functional sensitivity of <=0.01 uIU/mL. Performed at [facility], [address] [place], [place] [address]   Lithium level     Status: Abnormal   Collection Time: 06/02/20  6:33 AM  Result Value Ref Range   Lithium Lvl <0.06 (L) 0.60 - 1.20 mmol/L    Comment: Performed at [facility], [address] [place], [place] [address]    Blood Alcohol level:  Lab Results  Component Value Date   [facility] <10 06/02/2020   ETH <10 10/28/2019    Metabolic Disorder Labs:  Lab Results  Component Value Date  HGBA1C 5.0 05/05/2020   MPG 96.8 05/05/2020   MPG 103 06/18/2009   No results found for: PROLACTIN Lab Results  Component Value Date   CHOL 148 06/02/2020   TRIG 117 06/02/2020   HDL 38 (L) 06/02/2020   CHOLHDL 3.9 06/02/2020   VLDL 23 06/02/2020   LDLCALC 87 06/02/2020   LDLCALC 125 (H) 05/05/2020    Current Medications: Current Facility-Administered Medications  Medication Dose Route Frequency Provider Last Rate Last Admin  . acetaminophen (TYLENOL) tablet 650 mg  650 mg Oral Q6H PRN Gillermo Murdoch, NP   650 mg at 06/02/20 0810  . albuterol (VENTOLIN HFA) 108 (90 Base) MCG/ACT inhaler 1-2 puff  1-2 puff Inhalation Q6H PRN Gillermo Murdoch, NP   2 puff at 06/02/20 0806  . alum & mag hydroxide-simeth (MAALOX/MYLANTA) 200-200-20 MG/5ML suspension 30 mL  30 mL Oral Q4H PRN Gillermo Murdoch, NP      . citalopram (CELEXA) tablet 20 mg   20 mg Oral Daily Gillermo Murdoch, NP      . hydrOXYzine (ATARAX/VISTARIL) tablet 25 mg  25 mg Oral TID PRN Gillermo Murdoch, NP   25 mg at 06/02/20 0810  . levothyroxine (SYNTHROID) tablet 50 mcg  50 mcg Oral Q0600 Gillermo Murdoch, NP   50 mcg at 06/02/20 0617  . magnesium hydroxide (MILK OF MAGNESIA) suspension 30 mL  30 mL Oral Daily PRN Gillermo Murdoch, NP      . melatonin tablet 5 mg  5 mg Oral QHS Gillermo Murdoch, NP      . pneumococcal 23 valent vaccine (PNEUMOVAX-23) injection 0.5 mL  0.5 mL Intramuscular Tomorrow-1000 Jola Babinski Marlane Mingle, MD       PTA Medications: Medications Prior to Admission  Medication Sig Dispense Refill Last Dose  . ABILIFY MAINTENA 400 MG SRER injection Inject 200 mg into the muscle every 30 (thirty) days.   05/29/2020  . albuterol (PROVENTIL HFA;VENTOLIN HFA) 108 (90 Base) MCG/ACT inhaler Inhale 1-2 puffs into the lungs every 6 (six) hours as needed for wheezing or shortness of breath.     . citalopram (CELEXA) 20 MG tablet Take 1 tablet (20 mg total) by mouth daily. 30 tablet 1   . dicyclomine (BENTYL) 20 MG tablet Take 1 tablet (20 mg total) by mouth every 12 (twelve) hours as needed (for abdominal pain/cramping). 20 tablet 0   . hydrOXYzine (VISTARIL) 25 MG capsule Take 25 mg by mouth 3 (three) times daily as needed for anxiety.     Marland Kitchen levothyroxine (SYNTHROID, LEVOTHROID) 50 MCG tablet Take 50 mcg by mouth daily before breakfast.      . lithium carbonate (ESKALITH) 450 MG CR tablet Take 1 tablet (450 mg total) by mouth 2 (two) times daily. 60 tablet 1   . methocarbamol (ROBAXIN) 500 MG tablet Take 1 tablet (500 mg total) by mouth 2 (two) times daily. (Patient not taking: Reported on 05/06/2020) 20 tablet 0 Completed Course at Unknown time    Musculoskeletal: Strength & Muscle Tone: within normal limits Gait & Station: normal Patient leans: N/A  Psychiatric Specialty Exam: Physical Exam Vitals and nursing note reviewed.  HENT:      Head: Normocephalic and atraumatic.  Pulmonary:     Effort: Pulmonary effort is normal.  Neurological:     General: No focal deficit present.     Mental Status: She is alert.     Review of Systems  Blood pressure 103/74, pulse 88, temperature 97.6 F (36.4 C), temperature source Oral, resp. rate 20, height 5'  4" (1.626 m), weight 93.9 kg, last menstrual period 06/01/2020, SpO2 100 %.Body mass index is 35.53 kg/m.  General Appearance: Disheveled  Eye Contact:  Fair  Speech:  Normal Rate  Volume:  Decreased  Mood:  Dysphoric  Affect:  Flat  Thought Process:  Disorganized and Descriptions of Associations: Circumstantial  Orientation:  Negative  Thought Content:  Rumination  Suicidal Thoughts:  Yes.  without intent/plan  Homicidal Thoughts:  No  Memory:  Immediate;   Poor Recent;   Poor Remote;   Poor  Judgement:  Impaired  Insight:  Fair  Psychomotor Activity:  Decreased  Concentration:  Concentration: Fair and Attention Span: Fair  Recall:  [employer] of Knowledge:  Fair  Language:  Fair  Akathisia:  Negative  Handed:  Right  AIMS (if indicated):     Assets:  Desire for Improvement Resilience  ADL's:  Impaired  Cognition:  WNL  Sleep:  Number of Hours: 5.25    Treatment Plan Summary: Daily contact with patient to assess and evaluate symptoms and progress in treatment, Medication management and Plan : Patient is seen and examined.  Patient is a 19 year old female with the above-stated past psychiatric history who was admitted secondary to suicidal ideation.  She will be admitted to the hospital.  She will be integrated in the milieu.  She will be encouraged to attend groups.  She has refused the lithium and Celexa, and that was written on admission.  It is unclear whether or not that was what she was taking as an outpatient.  She apparently received some sedating medication since she got here, and she is a poor historian.  We will have to get collateral information from her  act team, especially with regard to when she had her last Abilify injection.  The list of medications from an emergency room visit on 02/21/2020 showed citalopram, hydroxyzine, lithium.  Review of her admission laboratories showed essentially normal electrolytes including liver function enzymes.  Her lipid panel was normal.  CBC was normal.  Lithium was less than 0.06.  Hemoglobin A1c was 5.0.  Pregnancy test was negative.  TSH was 3.100.  Blood alcohol was less than 10, drug screen was negative.  Observation Level/Precautions:  15 minute checks  Laboratory:  Chemistry Profile  Psychotherapy:    Medications:    Consultations:    Discharge Concerns:    Estimated LOS:  Other:     Physician Treatment Plan for Primary Diagnosis: <principal problem not specified> Long Term Goal(s): Improvement in symptoms so as ready for discharge  Short Term Goals: Ability to identify changes in lifestyle to reduce recurrence of condition will improve, Ability to verbalize feelings will improve, Ability to disclose and discuss suicidal ideas, Ability to demonstrate self-control will improve, Ability to identify and develop effective coping behaviors will improve and Ability to maintain clinical measurements within normal limits will improve  Physician Treatment Plan for Secondary Diagnosis: Active Problems:   PTSD (post-traumatic stress disorder)   Autism spectrum   ADHD (attention deficit hyperactivity disorder)   Suicidal ideation   Bipolar 1 disorder (HCC)  Long Term Goal(s): Improvement in symptoms so as ready for discharge  Short Term Goals: Ability to identify changes in lifestyle to reduce recurrence of condition will improve, Ability to verbalize feelings will improve, Ability to disclose and discuss suicidal ideas, Ability to demonstrate self-control will improve, Ability to identify and develop effective coping behaviors will improve and Ability to maintain clinical measurements within normal limits will  improve  I certify that inpatient services furnished can reasonably be expected to improve the patient's condition.    Antonieta Pert, MD 9/13/20211:13 PM

## 2020-06-02 NOTE — Progress Notes (Signed)
   06/02/20 0624  Vital Signs  Temp 97.6 F (36.4 C)  Temp Source Oral  Pulse Rate 85  Pulse Rate Source Monitor  Resp 20  BP 112/75  BP Location Left Arm  BP Method Automatic  Patient Position (if appropriate) Sitting  Oxygen Therapy  SpO2 100 %   D: Patient denies SI/HI/AVH. Patient rated anxiety 8/10 and depression 9/10. Patient out in open areas. A:  Patient took some scheduled medicine. Patient refused Celexa and lithium because pt. Stated that she was on them before and they did not work for her. Patient used her inhaler for asthma, 25 mg of vistaril for anxiety, and Tylenol for pain. Support and encouragement provided Routine safety checks conducted every 15 minutes. Patient  Informed to notify staff with any concerns.   R:  Safety maintained.

## 2020-06-02 NOTE — BHH Group Notes (Signed)
Occupational Therapy Group Note Date: 06/02/2020 Group Topic/Focus: Brain Fitness  Group Description: Group encouraged increased social engagement and participation through discussion/activity focused on brain fitness. Patients were provided education on various brain fitness activities/strategies, with explanation provided on the qualifying factors including: one, that is has to be challenging/hard and two, it has to be something that you do not do every day. Patients engaged actively during group session in various brain fitness activities to increase attention, concentration, and problem-solving skills. Discussion followed with a focus on identifying the benefits of brain fitness activities as use for adaptive coping strategies and distraction.   Participation Level: Hyperverbal   Participation Quality: Moderate Cues   Behavior: Hyperverbal and Restless   Speech/Thought Process: Distracted   Affect/Mood: Full range   Insight: Fair   Judgement: Fair   Individualization: Amanda [name] was active in her participation, however easily distracted and appeared restless. Pt often spoke over her peers and had difficulty maintaining appropriate boundaries. Pt identified having "unmedicated ADHD" and asked on several occasions to stand up and pace around the room. Pt identified difficulty with brain fitness tasks, however was engaged and made attempts to complete them in group.   Modes of Intervention: Activity, Discussion, Education, Problem-solving and Socialization  Patient Response to Interventions:  Attentive, Challenging, Engaged, Receptive and Interested   Plan: Continue to engage patient in OT groups 2 - 3x/week.  06/02/2020  Donne Hazel, MOT, OTR/L

## 2020-06-02 NOTE — Progress Notes (Addendum)
Spiritual care group on grief and loss facilitated by chaplain Britten Parady  Group Goal:  Support / Education around grief and loss Members engage in facilitated group support and psycho-social education.  Group Description:  Following introductions and group rules, group members engaged in facilitated group dialog and support around topic of loss, with particular support around experiences of loss in their lives. Group Identified types of loss (relationships / self / things) and identified patterns, circumstances, and changes that precipitate losses. Reflected on thoughts / feelings around loss, normalized grief responses, and recognized variety in grief experience. Patient Progress: Did not attend  

## 2020-06-03 LAB — PROLACTIN: Prolactin: 33.1 ng/mL — ABNORMAL HIGH (ref 4.8–23.3)

## 2020-06-03 MED ORDER — LOPERAMIDE HCL 2 MG PO CAPS
2.0000 mg | ORAL_CAPSULE | ORAL | Status: DC | PRN
  Administered 2020-06-04: 2 mg via ORAL
  Filled 2020-06-03: qty 1

## 2020-06-03 NOTE — Progress Notes (Signed)
[facility] MD Progress Note  06/03/2020 2:12 PM Amanda [name]  MRN:  [id number] Subjective: Patient is a 19 year old female with a reported past psychiatric history significant for bipolar disorder and attention deficit disorder who presented to the [facility] on [facility]/01/2020 with her ACT team counselor.  The patient reported she was currently suicidal and had a plan to overdose on medications.  Objective: Patient is seen and examined.  Patient is a 19 year old female with the above-stated past psychiatric history who is seen in follow-up.  She states she is doing well.  She is enjoying the groups.  We discussed the fact that I had attempted to add Trileptal for mood stability for her bipolar disorder, but she stated "I am on Abilify long-acting and that controls my bipolar disorder".  I stated well if things are going well with Abilify than what led to you being hospitalized with suicidal ideation.  She was unable to answer that.  Otherwise she denied any suicidal thoughts.  She denied any auditory or visual hallucinations.  We discussed her isolation in her apartment by herself, and she stated she is just learning to be by herself.  She stated she is going out to church groups.  She apparently in the past had been in a group home, but she is not interested in those types of things.  She has had multiple psychiatric hospitalizations in the past and has multiple diagnoses.  I suspect some degree of borderline personality disorder.  She denied any auditory or visual hallucinations.  She denied any suicidal or homicidal ideation.  She denied any side effects to her current medications.  Her vital signs are stable, she is afebrile.  She slept 6.25 hours last night.  Review of her admission laboratories showed essentially no abnormalities.  Pregnancy test was negative.  Blood alcohol was less than 10.  Drug screen was negative.  TSH was 3.100.  Principal Problem: <principal problem not  specified> Diagnosis: Active Problems:   PTSD (post-traumatic stress disorder)   Autism spectrum   ADHD (attention deficit hyperactivity disorder)   Suicidal ideation   Bipolar 1 disorder (HCC)  Total Time spent with patient: 20 minutes  Past Psychiatric History: See admission H&P  Past Medical History:  Past Medical History:  Diagnosis Date  . Asthma   . Attention deficit hyperactivity disorder   . Depression   . Eczema   . Mood disorder (HCC)   . Oppositional defiant disorder     Past Surgical History:  Procedure Laterality Date  . ADENOIDECTOMY    . TYMPANOSTOMY TUBE PLACEMENT     Family History: History reviewed. No pertinent family history. Family Psychiatric  History: See admission H&P Social History:  Social History   Substance and Sexual Activity  Alcohol Use No     Social History   Substance and Sexual Activity  Drug Use No    Social History   Socioeconomic History  . Marital status: Single    Spouse name: Not on file  . Number of children: Not on file  . Years of education: Not on file  . Highest education level: Not on file  Occupational History  . Not on file  Tobacco Use  . Smoking status: Never Smoker  . Smokeless tobacco: Never Used  Substance and Sexual Activity  . Alcohol use: No  . Drug use: No  . Sexual activity: Not on file  Other Topics Concern  . Not on file  Social History Narrative  . Not  on file   Social Determinants of Health   Financial Resource Strain:   . Difficulty of Paying Living Expenses: Not on file  Food Insecurity:   . Worried About Programme researcher, broadcasting/film/video in the Last Year: Not on file  . Ran Out of Food in the Last Year: Not on file  Transportation Needs:   . Lack of Transportation (Medical): Not on file  . Lack of Transportation (Non-Medical): Not on file  Physical Activity:   . Days of Exercise per Week: Not on file  . Minutes of Exercise per Session: Not on file  Stress:   . Feeling of Stress : Not on file   Social Connections:   . Frequency of Communication with Friends and Family: Not on file  . Frequency of Social Gatherings with Friends and Family: Not on file  . Attends Religious Services: Not on file  . Active Member of Clubs or Organizations: Not on file  . Attends Banker Meetings: Not on file  . Marital Status: Not on file   Additional Social History:    Pain Medications: See MAR Prescriptions: See MAR Over the Counter: See MAR History of alcohol / drug use?: Yes (Infrequent use of alcohol. Reports she has tried marijuana three times in the past.) Longest period of sobriety (when/how long): NA                    Sleep: Good  Appetite:  Good  Current Medications: Current Facility-Administered Medications  Medication Dose Route Frequency Provider Last Rate Last Admin  . acetaminophen (TYLENOL) tablet 650 mg  650 mg Oral Q6H PRN Gillermo Murdoch, NP   650 mg at 06/02/20 2101  . albuterol (VENTOLIN HFA) 108 (90 Base) MCG/ACT inhaler 1-2 puff  1-2 puff Inhalation Q6H PRN Gillermo Murdoch, NP   1 puff at 06/03/20 0943  . alum & mag hydroxide-simeth (MAALOX/MYLANTA) 200-200-20 MG/5ML suspension 30 mL  30 mL Oral Q4H PRN Gillermo Murdoch, NP      . citalopram (CELEXA) tablet 20 mg  20 mg Oral Daily Gillermo Murdoch, NP      . hydrOXYzine (ATARAX/VISTARIL) tablet 25 mg  25 mg Oral TID PRN Gillermo Murdoch, NP   25 mg at 06/02/20 2101  . levothyroxine (SYNTHROID) tablet 50 mcg  50 mcg Oral Q0600 Gillermo Murdoch, NP   50 mcg at 06/03/20 0617  . loperamide (IMODIUM) capsule 2 mg  2 mg Oral PRN Antonieta Pert, MD      . magnesium hydroxide (MILK OF MAGNESIA) suspension 30 mL  30 mL Oral Daily PRN Gillermo Murdoch, NP   30 mL at 06/02/20 2248  . melatonin tablet 5 mg  5 mg Oral QHS Gillermo Murdoch, NP   5 mg at 06/02/20 2101  . pneumococcal 23 valent vaccine (PNEUMOVAX-23) injection 0.5 mL  0.5 mL Intramuscular Tomorrow-1000 Jola Babinski,  Marlane Mingle, MD        Lab Results:  Results for orders placed or performed during the hospital encounter of 06/01/20 (from the past 48 hour(s))  SARS Coronavirus 2 by RT PCR (hospital order, performed in [facility] hospital lab) Nasopharyngeal Nasopharyngeal Swab     Status: None   Collection Time: 06/01/20  9:17 PM   Specimen: Nasopharyngeal Swab  Result Value Ref Range   SARS Coronavirus 2 NEGATIVE NEGATIVE    Comment: (NOTE) SARS-CoV-2 target nucleic acids are NOT DETECTED.  The SARS-CoV-2 RNA is generally detectable in upper and lower respiratory specimens during the acute  phase of infection. The lowest concentration of SARS-CoV-2 viral copies this assay can detect is 250 copies / mL. A negative result does not preclude SARS-CoV-2 infection and should not be used as the sole basis for treatment or other patient management decisions.  A negative result may occur with improper specimen collection / handling, submission of specimen other than nasopharyngeal swab, presence of viral mutation(s) within the areas targeted by this assay, and inadequate number of viral copies (<250 copies / mL). A negative result must be combined with clinical observations, patient history, and epidemiological information.  Fact Sheet for Patients:   BoilerBrush.com.cy  Fact Sheet for Healthcare Providers: https://pope.com/  This test is not yet approved or  cleared by the [place] FDA and has been authorized for detection and/or diagnosis of SARS-CoV-2 by FDA under an Emergency Use Authorization (EUA).  This EUA will remain in effect (meaning this test can be used) for the duration of the COVID-19 declaration under Section 564(b)(1) of the Act, 21 U.S.C. section 360bbb-3(b)(1), unless the authorization is terminated or revoked sooner.  Performed at [facility], [address] [place], [place] [address]   CBC     Status:  None   Collection Time: 06/02/20  6:33 AM  Result Value Ref Range   WBC 8.6 4.0 - 10.5 K/uL   RBC 4.44 3.87 - 5.11 MIL/uL   Hemoglobin 13.2 12.0 - 15.0 g/dL   HCT 60.4 36 - 46 %   MCV 89.2 80.0 - 100.0 fL   MCH 29.7 26.0 - 34.0 pg   MCHC 33.3 30.0 - 36.0 g/dL   RDW 54.0 98.1 - 19.1 %   Platelets 277 150 - 400 K/uL   nRBC 0.0 0.0 - 0.2 %    Comment: Performed at [facility], [address], [place], [place] [address]  Comprehensive metabolic panel     Status: Abnormal   Collection Time: 06/02/20  6:33 AM  Result Value Ref Range   Sodium 139 135 - 145 mmol/L   Potassium 4.2 3.5 - 5.1 mmol/L   Chloride 105 98 - 111 mmol/L   CO2 23 22 - 32 mmol/L   Glucose, Bld 102 (H) 70 - 99 mg/dL    Comment: Glucose reference range applies only to samples taken after fasting for at least 8 hours.   BUN 21 (H) 6 - 20 mg/dL   Creatinine, Ser 5.62 0.44 - 1.00 mg/dL   Calcium 9.8 8.9 - 13.0 mg/dL   Total Protein 7.4 6.5 - 8.1 g/dL   Albumin 4.2 3.5 - 5.0 g/dL   AST 17 15 - 41 U/L   ALT 15 0 - 44 U/L   Alkaline Phosphatase 55 38 - 126 U/L   Total Bilirubin 0.4 0.3 - 1.2 mg/dL   GFR calc non Af Amer >60 >60 mL/min   GFR calc Af Amer >60 >60 mL/min   Anion gap 11 5 - 15    Comment: Performed at [facility], [address], [place], [place] [address]  Ethanol     Status: None   Collection Time: 06/02/20  6:33 AM  Result Value Ref Range   Alcohol, Ethyl (B) <10 <10 mg/dL    Comment: (NOTE) Lowest detectable limit for serum alcohol is 10 mg/dL.  For medical purposes only. Performed at [facility], [address] [place], [place] [address]   Lipid panel     Status: Abnormal   Collection Time: 06/02/20  6:33  AM  Result Value Ref Range   Cholesterol 148 0 - 200 mg/dL   Triglycerides 998 <338 mg/dL   HDL 38 (L) >25 mg/dL   Total CHOL/HDL Ratio 3.9 RATIO   VLDL 23 0 - 40 mg/dL   LDL Cholesterol 87 0 - 99 mg/dL    Comment:         Total Cholesterol/HDL:CHD Risk Coronary Heart Disease Risk Table                     Men   Women  1/2 Average Risk   3.4   3.3  Average Risk       5.0   4.4  2 X Average Risk   9.6   7.1  3 X Average Risk  23.4   11.0        Use the calculated Patient Ratio above and the CHD Risk Table to determine the patient's CHD Risk.        ATP III CLASSIFICATION (LDL):  <100     mg/dL   Optimal  053-976  mg/dL   Near or Above                    Optimal  130-159  mg/dL   Borderline  734-193  mg/dL   High  >790     mg/dL   Very High Performed at [facility], [address] [place], [place] [address]   Hepatic function panel     Status: None   Collection Time: 06/02/20  6:33 AM  Result Value Ref Range   Total Protein 7.4 6.5 - 8.1 g/dL   Albumin 4.3 3.5 - 5.0 g/dL   AST 17 15 - 41 U/L   ALT 15 0 - 44 U/L   Alkaline Phosphatase 54 38 - 126 U/L   Total Bilirubin 0.5 0.3 - 1.2 mg/dL   Bilirubin, Direct <3.5 0.0 - 0.2 mg/dL   Indirect Bilirubin NOT CALCULATED 0.3 - 0.9 mg/dL    Comment: Performed at [facility], [address] [place], [place] [address]  TSH     Status: None   Collection Time: 06/02/20  6:33 AM  Result Value Ref Range   TSH 3.100 0.350 - 4.500 uIU/mL    Comment: Performed by a 3rd Generation assay with a functional sensitivity of <=0.01 uIU/mL. Performed at [facility], [address] [place], [place] [address]   Prolactin     Status: Abnormal   Collection Time: 06/02/20  6:33 AM  Result Value Ref Range   Prolactin 33.1 (H) 4.8 - 23.3 ng/mL    Comment: (NOTE) Performed At: [facility] [address] [place], [place] [id number] Jolene Schimke MD LG:[id number]   Lithium level     Status: Abnormal   Collection Time: 06/02/20  6:33 AM  Result Value Ref Range   Lithium Lvl <0.06 (L) 0.60 - 1.20 mmol/L    Comment: Performed at [facility], [address], [place], [place] [address]    Blood Alcohol  level:  Lab Results  Component Value Date   [facility] <10 06/02/2020   ETH <10 10/28/2019    Metabolic Disorder Labs: Lab Results  Component Value Date   HGBA1C 5.0 05/05/2020   MPG 96.8 05/05/2020   MPG 103 06/18/2009   Lab Results  Component Value Date   PROLACTIN 33.1 (H) 06/02/2020   Lab Results  Component Value Date   CHOL 148 06/02/2020  TRIG 117 06/02/2020   HDL 38 (L) 06/02/2020   CHOLHDL 3.9 06/02/2020   VLDL 23 06/02/2020   LDLCALC 87 06/02/2020   LDLCALC 125 (H) 05/05/2020    Physical Findings: AIMS: Facial and Oral Movements Muscles of Facial Expression: None, normal Lips and Perioral Area: None, normal Jaw: None, normal Tongue: None, normal,Extremity Movements Upper (arms, wrists, hands, fingers): None, normal Lower (legs, knees, ankles, toes): None, normal, Trunk Movements Neck, shoulders, hips: None, normal, Overall Severity Severity of abnormal movements (highest score from questions above): None, normal Incapacitation due to abnormal movements: None, normal Patient's awareness of abnormal movements (rate only patient's report): No Awareness, Dental Status Current problems with teeth and/or dentures?: No Does patient usually wear dentures?: No  CIWA:    COWS:     Musculoskeletal: Strength & Muscle Tone: within normal limits Gait & Station: normal Patient leans: N/A  Psychiatric Specialty Exam: Physical Exam Vitals and nursing note reviewed.  Constitutional:      Appearance: Normal appearance.  HENT:     Head: Normocephalic and atraumatic.  Pulmonary:     Effort: Pulmonary effort is normal.  Neurological:     General: No focal deficit present.     Mental Status: She is alert and oriented to person, place, and time.     Review of Systems  Blood pressure 113/66, pulse 86, temperature 97.7 F (36.5 C), temperature source Oral, resp. rate 20, height 5\' 4"  (1.626 m), weight 93.9 kg, last menstrual period 06/01/2020, SpO2 100 %.Body mass index is  35.53 kg/m.  General Appearance: Casual  Eye Contact:  Good  Speech:  Normal Rate  Volume:  Normal  Mood:  Anxious  Affect:  Congruent  Thought Process:  Coherent and Descriptions of Associations: Circumstantial  Orientation:  Full (Time, Place, and Person)  Thought Content:  Logical  Suicidal Thoughts:  No  Homicidal Thoughts:  No  Memory:  Immediate;   Fair Recent;   Fair Remote;   Fair  Judgement:  Intact  Insight:  Lacking  Psychomotor Activity:  Normal  Concentration:  Concentration: Fair and Attention Span: Fair  Recall:  [employer]Fair  Fund of Knowledge:  Fair  Language:  Good  Akathisia:  Negative  Handed:  Right  AIMS (if indicated):     Assets:  Desire for Improvement Resilience  ADL's:  Intact  Cognition:  WNL  Sleep:  Number of Hours: 6.25     Treatment Plan Summary: Daily contact with patient to assess and evaluate symptoms and progress in treatment, Medication management and Plan : Patient is seen and examined.  Patient is a 19 year old female with the above-stated past psychiatric history who is seen in follow-up.   Diagnosis: 1.  Bipolar disorder 2.  Posttraumatic stress disorder. 3.  Borderline personality disorder. 4.  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  Patient denied any suicidal or homicidal ideation. 2.  Denied any symptoms of depression or bipolar disorder. 3.  Declined any medication adjustments.  Plan: 1.  Continue albuterol HFA as needed as needed wheezing. 2.  Continue Celexa 20 mg p.o. daily for anxiety and depression. 3.  Continue levothyroxine 50 mcg p.o. daily for hypothyroidism. 4.  Patient complained of diarrhea, and we will add Imodium 2 mg p.o. as needed diarrhea or loose stools. 5.  Patient has previously received the long-acting Abilify injection for mood stability and psychosis. 6.  Disposition planning-if all goes well we will plan on discharge in 1 to 2  days.  Antonieta Pert, MD 06/03/2020, 2:12 PM

## 2020-06-03 NOTE — Progress Notes (Signed)
The patient expressed in group that she might be getting discharged tomorrow. Her goal for tomorrow is to have another good day.

## 2020-06-03 NOTE — Tx Team (Signed)
Interdisciplinary Treatment and Diagnostic Plan Update  06/03/2020 Time of Session: 3:35PM Amanda [name] MRN: [id number]  Principal Diagnosis: <principal problem not specified>  Secondary Diagnoses: Active Problems:   PTSD (post-traumatic stress disorder)   Autism spectrum   ADHD (attention deficit hyperactivity disorder)   Suicidal ideation   Bipolar 1 disorder (HCC)   Current Medications:  Current Facility-Administered Medications  Medication Dose Route Frequency Provider Last Rate Last Admin  . acetaminophen (TYLENOL) tablet 650 mg  650 mg Oral Q6H PRN Caroline Sauger, NP   650 mg at 06/02/20 2101  . albuterol (VENTOLIN HFA) 108 (90 Base) MCG/ACT inhaler 1-2 puff  1-2 puff Inhalation Q6H PRN Caroline Sauger, NP   2 puff at 06/02/20 1632  . alum & mag hydroxide-simeth (MAALOX/MYLANTA) 200-200-20 MG/5ML suspension 30 mL  30 mL Oral Q4H PRN Caroline Sauger, NP      . citalopram (CELEXA) tablet 20 mg  20 mg Oral Daily Caroline Sauger, NP      . hydrOXYzine (ATARAX/VISTARIL) tablet 25 mg  25 mg Oral TID PRN Caroline Sauger, NP   25 mg at 06/02/20 2101  . levothyroxine (SYNTHROID) tablet 50 mcg  50 mcg Oral Q0600 Caroline Sauger, NP   50 mcg at 06/03/20 0617  . magnesium hydroxide (MILK OF MAGNESIA) suspension 30 mL  30 mL Oral Daily PRN Caroline Sauger, NP   30 mL at 06/02/20 2248  . melatonin tablet 5 mg  5 mg Oral QHS Caroline Sauger, NP   5 mg at 06/02/20 2101  . OXcarbazepine (TRILEPTAL) tablet 150 mg  150 mg Oral BID Sharma Covert, MD   150 mg at 06/02/20 1850  . pneumococcal 23 valent vaccine (PNEUMOVAX-23) injection 0.5 mL  0.5 mL Intramuscular Tomorrow-1000 Mallie Darting, Cordie Grice, MD       PTA Medications: Medications Prior to Admission  Medication Sig Dispense Refill Last Dose  . ABILIFY MAINTENA 400 MG SRER injection Inject 200 mg into the muscle every 30 (thirty) days.   05/29/2020  . albuterol (PROVENTIL HFA;VENTOLIN HFA) 108 (90  Base) MCG/ACT inhaler Inhale 1-2 puffs into the lungs every 6 (six) hours as needed for wheezing or shortness of breath.     . citalopram (CELEXA) 20 MG tablet Take 1 tablet (20 mg total) by mouth daily. 30 tablet 1   . dicyclomine (BENTYL) 20 MG tablet Take 1 tablet (20 mg total) by mouth every 12 (twelve) hours as needed (for abdominal pain/cramping). 20 tablet 0   . hydrOXYzine (VISTARIL) 25 MG capsule Take 25 mg by mouth 3 (three) times daily as needed for anxiety.     Marland Kitchen levothyroxine (SYNTHROID, LEVOTHROID) 50 MCG tablet Take 50 mcg by mouth daily before breakfast.      . lithium carbonate (ESKALITH) 450 MG CR tablet Take 1 tablet (450 mg total) by mouth 2 (two) times daily. 60 tablet 1   . methocarbamol (ROBAXIN) 500 MG tablet Take 1 tablet (500 mg total) by mouth 2 (two) times daily. (Patient not taking: Reported on 05/06/2020) 20 tablet 0 Completed Course at Unknown time    Patient Stressors: Marital or family conflict  Patient Strengths: Active sense of humor Communication skills Special hobby/interest  Treatment Modalities: Medication Management, Group therapy, Case management,  1 to 1 session with clinician, Psychoeducation, Recreational therapy.   Physician Treatment Plan for Primary Diagnosis: <principal problem not specified> Long Term Goal(s): Improvement in symptoms so as ready for discharge Improvement in symptoms so as ready for discharge   Short Term Goals: Ability  to identify changes in lifestyle to reduce recurrence of condition will improve Ability to verbalize feelings will improve Ability to disclose and discuss suicidal ideas Ability to demonstrate self-control will improve Ability to identify and develop effective coping behaviors will improve Ability to maintain clinical measurements within normal limits will improve Ability to identify changes in lifestyle to reduce recurrence of condition will improve Ability to verbalize feelings will improve Ability to  disclose and discuss suicidal ideas Ability to demonstrate self-control will improve Ability to identify and develop effective coping behaviors will improve Ability to maintain clinical measurements within normal limits will improve  Medication Management: Evaluate patient's response, side effects, and tolerance of medication regimen.  Therapeutic Interventions: 1 to 1 sessions, Unit Group sessions and Medication administration.  Evaluation of Outcomes: Not Met  Physician Treatment Plan for Secondary Diagnosis: Active Problems:   PTSD (post-traumatic stress disorder)   Autism spectrum   ADHD (attention deficit hyperactivity disorder)   Suicidal ideation   Bipolar 1 disorder (HCC)  Long Term Goal(s): Improvement in symptoms so as ready for discharge Improvement in symptoms so as ready for discharge   Short Term Goals: Ability to identify changes in lifestyle to reduce recurrence of condition will improve Ability to verbalize feelings will improve Ability to disclose and discuss suicidal ideas Ability to demonstrate self-control will improve Ability to identify and develop effective coping behaviors will improve Ability to maintain clinical measurements within normal limits will improve Ability to identify changes in lifestyle to reduce recurrence of condition will improve Ability to verbalize feelings will improve Ability to disclose and discuss suicidal ideas Ability to demonstrate self-control will improve Ability to identify and develop effective coping behaviors will improve Ability to maintain clinical measurements within normal limits will improve     Medication Management: Evaluate patient's response, side effects, and tolerance of medication regimen.  Therapeutic Interventions: 1 to 1 sessions, Unit Group sessions and Medication administration.  Evaluation of Outcomes: Not Met   RN Treatment Plan for Primary Diagnosis: <principal problem not specified> Long Term  Goal(s): Knowledge of disease and therapeutic regimen to maintain health will improve  Short Term Goals: Ability to remain free from injury will improve, Ability to verbalize frustration and anger appropriately will improve, Ability to verbalize feelings will improve and Ability to identify and develop effective coping behaviors will improve  Medication Management: RN will administer medications as ordered by provider, will assess and evaluate patient's response and provide education to patient for prescribed medication. RN will report any adverse and/or side effects to prescribing provider.  Therapeutic Interventions: 1 on 1 counseling sessions, Psychoeducation, Medication administration, Evaluate responses to treatment, Monitor vital signs and CBGs as ordered, Perform/monitor CIWA, COWS, AIMS and Fall Risk screenings as ordered, Perform wound care treatments as ordered.  Evaluation of Outcomes: Not Met   LCSW Treatment Plan for Primary Diagnosis: <principal problem not specified> Long Term Goal(s): Safe transition to appropriate next level of care at discharge, Engage patient in therapeutic group addressing interpersonal concerns.  Short Term Goals: Engage patient in aftercare planning with referrals and resources, Increase emotional regulation, Facilitate patient progression through stages of change regarding substance use diagnoses and concerns and Increase skills for wellness and recovery  Therapeutic Interventions: Assess for all discharge needs, 1 to 1 time with Social worker, Explore available resources and support systems, Assess for adequacy in community support network, Educate family and significant other(s) on suicide prevention, Complete Psychosocial Assessment, Interpersonal group therapy.  Evaluation of Outcomes: Not Met  Progress in Treatment: Attending groups: Yes. Participating in groups: Yes. Taking medication as prescribed: Yes. Toleration medication:  Yes. Family/Significant other contact made: No, will contact:  CSW to follow-up on SPE contact. Patient understands diagnosis: No. Discussing patient identified problems/goals with staff: Yes. Medical problems stabilized or resolved: Yes. Denies suicidal/homicidal ideation: Yes. Issues/concerns per patient self-inventory: No. Other: None  New problem(s) identified: No, Describe:  None  New Short Term/Long Term Goal(s): SW to consult with Pt's ACT Team regarding plan for maintaining safety post discharge.  Patient Goals:  "To get better. To find a new anti-depressant."  Discharge Plan or Barriers: None, SW to continue to assess.  Reason for Continuation of Hospitalization: Depression Medication stabilization Suicidal ideation  Estimated Length of Stay: 3-5 Days  Attendees: Patient: Amanda [name] 06/03/2020 9:23 AM  Physician: Dr. Mallie Darting 06/03/2020 9:23 AM  Nursing:  06/03/2020 9:23 AM  RN Care Manager: 06/03/2020 9:23 AM  Social Worker: Freddi Che, LCSW 06/03/2020 9:23 AM  Recreational Therapist:  06/03/2020 9:23 AM  Other:  06/03/2020 9:23 AM  Other:  06/03/2020 9:23 AM  Other: 06/03/2020 9:23 AM    Scribe for Treatment Team: (Meeting conducted on 06-02-2020 at 3:335PM) Freddi Che, LCSW 06/03/2020 9:23 AM
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
## 2020-06-03 NOTE — Progress Notes (Signed)
   06/03/20 0626  Vital Signs  Pulse Rate 86  BP 113/66  BP Location Right Arm  BP Method Automatic  Patient Position (if appropriate) Standing   D: Patient denies SI/HI/AVH. Pt. Out in open areas and was social with peers and staff. Pt. Complained of SOB in the morning, and was given her inhaler. A:  Patient took scheduled medicine.  Support and encouragement provided Routine safety checks conducted every 15 minutes. Patient  Informed to notify staff with any concerns.   R:  Safety maintained.

## 2020-06-03 NOTE — Progress Notes (Signed)
   06/02/20 2300  Psych Admission Type (Psych Patients Only)  Admission Status Voluntary  Psychosocial Assessment  Patient Complaints Anxiety  Eye Contact Fair  Facial Expression Anxious;Animated  Affect Apprehensive;Appropriate to circumstance;Anxious;Depressed  Speech Journalist, newspaper;Restless  Appearance/Hygiene Improved  Behavior Characteristics Cooperative  Mood Anxious  Thought Process  Content WDL  Delusions None reported or observed  Perception WDL  Hallucination None reported or observed  Judgment Poor  Confusion None  Danger to Self  Current suicidal ideation? Passive  Self-Injurious Behavior No self-injurious ideation or behavior indicators observed or expressed   Agreement Not to Harm Self Yes  Description of Agreement verbal  Danger to Others  Danger to Others None reported or observed  Pt c/o constipation r/t IBS prn mom given no results reported yet.

## 2020-06-04 MED ORDER — MELATONIN 5 MG PO TABS
5.0000 mg | ORAL_TABLET | Freq: Every day | ORAL | 0 refills | Status: DC
Start: 2020-06-04 — End: 2021-04-05

## 2020-06-04 MED ORDER — HYDROXYZINE HCL 25 MG PO TABS
25.0000 mg | ORAL_TABLET | Freq: Three times a day (TID) | ORAL | 0 refills | Status: DC | PRN
Start: 2020-06-04 — End: 2021-04-05

## 2020-06-04 MED ORDER — ABILIFY MAINTENA 400 MG IM SRER
200.0000 mg | INTRAMUSCULAR | 0 refills | Status: DC
Start: 2020-06-27 — End: 2024-07-23

## 2020-06-04 MED ORDER — CITALOPRAM HYDROBROMIDE 20 MG PO TABS
20.0000 mg | ORAL_TABLET | Freq: Every day | ORAL | 0 refills | Status: DC
Start: 2020-06-04 — End: 2021-04-05

## 2020-06-04 NOTE — Progress Notes (Signed)
Discharge Note:  Patient was discharged with bus tickets to her home.  Suicide prevention information given and discussed with patient who stated she understood and had no questions.  Patient stateds he received all her belongings, clothing, toiletries, etc.  Patient stated she appreciated all assistance from [facility] staff.  All required discharge information given to patient at discharge.

## 2020-06-04 NOTE — Discharge Summary (Signed)
Physician Discharge Summary Note  Patient:  Amanda [name] is an 19 y.o., female  MRN:  [id number]  DOB:  Jan 12, 2001  Patient phone:  [phone] (home)   Patient address:   [address] [address],   Total Time spent with patient: Greater than 30 minutes  Date of Admission:  06/01/2020  Date of Discharge: 06-04-20  Reason for Admission: Suicidal ideations & had a plan to overdose on her medications.  Principal Problem: Bipolar 1 disorder [facility])  Discharge Diagnoses: Principal Problem:   Bipolar 1 disorder ([place]) Active Problems:   PTSD (post-traumatic stress disorder)   Autism spectrum   ADHD (attention deficit hyperactivity disorder)   Suicidal ideation  Past Psychiatric History: Bipolar 1 disorder, PTSD  Past Medical History:  Past Medical History:  Diagnosis Date  . Asthma   . Attention deficit hyperactivity disorder   . Depression   . Eczema   . Mood disorder ([place])   . Oppositional defiant disorder     Past Surgical History:  Procedure Laterality Date  . ADENOIDECTOMY    . TYMPANOSTOMY TUBE PLACEMENT     Family History: History reviewed. No pertinent family history.  Family Psychiatric  History: See H&P  Social History:  Social History   Substance and Sexual Activity  Alcohol Use No     Social History   Substance and Sexual Activity  Drug Use No    Social History   Socioeconomic History  . Marital status: Single    Spouse name: Not on file  . Number of children: Not on file  . Years of education: Not on file  . Highest education level: Not on file  Occupational History  . Not on file  Tobacco Use  . Smoking status: Never Smoker  . Smokeless tobacco: Never Used  Substance and Sexual Activity  . Alcohol use: No  . Drug use: No  . Sexual activity: Not on file  Other Topics Concern  . Not on file  Social History Narrative  . Not on file   Social Determinants of Health   Financial Resource Strain:   .  Difficulty of Paying Living Expenses: Not on file  Food Insecurity:   . Worried About Charity fundraiser in the Last Year: Not on file  . Ran Out of Food in the Last Year: Not on file  Transportation Needs:   . Lack of Transportation (Medical): Not on file  . Lack of Transportation (Non-Medical): Not on file  Physical Activity:   . Days of Exercise per Week: Not on file  . Minutes of Exercise per Session: Not on file  Stress:   . Feeling of Stress : Not on file  Social Connections:   . Frequency of Communication with Friends and Family: Not on file  . Frequency of Social Gatherings with Friends and Family: Not on file  . Attends Religious Services: Not on file  . Active Member of Clubs or Organizations: Not on file  . Attends Archivist Meetings: Not on file  . Marital Status: Not on file   Hospital Course: (Per Md's admission evaluation notes): Patient is seen and examined. Patient is a 19 year old female with a reported past psychiatric history significant for bipolar disorder and attention deficit hyperactivity disorder who [facility] on [facility]/2020 with her act team counselor. Patient stated she was currently suicidal and had a plan to overdose on her medications. She lives alone in an apartment. She  had apparently attempted suicide by overdosing on medications approximately 6 years ago. She stated the reason why she wanted to die which she had been having a break-up with a significant other over period of time, and she was unclear on that. During the interview today she was significantly sedated. She stated the reason why she was admitted was to get started on "Ativan and a weight loss drug". She denied any auditory or visual hallucinations. She denied any drug use but did admit to alcohol use. On admission she was placed on Celexa as well as lithium, but she refused those this morning and stated "those do not work for me". She  was admitted to the hospital for evaluation and stabilization. Her last admission to our system apparently was in 2015. At that time she was 19 years old and had been admitted from a group home because she was breaking windows and kicking holes in walls. She had also attempted to hang herself with a bra. At that time she had a history of bipolar disorder, depression, PTSD, oppositional defiant disorder and attention deficit hyperactivity disorder. In her admission note at that time she had had 2 previous behavioral health hospital admissions, previous admissions to [facility], [facility], [facility], [facility], [facility]. She apparently had been seen in the emergency department on 05/06/2020. Her complaint was the same as at that time. The note stated that she had contacted her act team and they had recommended that she come in for evaluation. She apparently receives Abilify maintain him monthly, and takes hydroxyzine and Zofran. The patient was discharged from the ED to [facility] with [facility]. She was admitted for evaluation and stabilization on this visit.  After the above admission evaluation, patient's presenting symptoms were noted. She was recommended for mood stabilization treatments. The medication regimen targeting those presenting symptoms were discussed with her & initiated with her consent. She was medicated, stabilized & discharged on the medications as listed on her discharge medication lists below. Besides the mood stabilization treatments, Amanda [name] was also enrolled & participated in the group counseling sessions being offered & held on this unit. She learned coping skills. She also presented other significant pre-existing medical issues that required treatment. She was resumed & discharged on all her pertinent home medications for those health issues.  Amanda [name]'s symptoms responded well to her treatment regimen. Her symptoms has subsided & mood stable. Patient has met the maximum  benefit of her hospitalization. She is currently mentally & medically stable to continue mental health care & medication management on an outpatient basis as noted below with her Act Team. She was on the monthly Abilify Maintena injectable. Patient reported that she had received this monthly injectable last week Thursday which would been 05-29-20 by her Act Team. This will be due to administered again on or around 06-27-20 by her Act Team.   During the course of her hospitalization, the 15-minute checks were adequate to ensure Amanda [name]'s safety.  Patient did not display any dangerous, violent or suicidal behavior on the unit.  She interacted with patients & staff appropriately. She participated appropriately in the group sessions/therapies. Her medications were addressed & adjusted to meet her needs. She was recommended for outpatient follow-up care & medication management upon discharge to assure continuity of care.  At the time of discharge, patient is not reporting any acute suicidal/homicidal ideations. She feels more confident about her self-care & in managing the suicidal thoughts moving forward. She currently denies any new issues or concerns. Education  and supportive counseling provided throughout her hospital stay & upon discharge.  Today upon her discharge evaluation with the attending psychiatrist, Amanda [name] shares she is doing well. She denies any other specific concerns. She is sleeping well. Her appetite is good. She denies other physical complaints. She denies AH/VH. She feels that her medications have been helpful & is in agreement to continue her current treatment regimen. She was able to engage in safety planning including plan to return to [facility] or contact emergency services if she feels unable to maintain her own safety or the safety of others. Pt had no further questions, comments, or concerns. She left [facility] with all personal belongings in no apparent distress. Transportation per the city bus.  [place] assisted with bus pass.  Physical Findings: AIMS: Facial and Oral Movements Muscles of Facial Expression: None, normal Lips and Perioral Area: None, normal Jaw: None, normal Tongue: None, normal,Extremity Movements Upper (arms, wrists, hands, fingers): None, normal Lower (legs, knees, ankles, toes): None, normal, Trunk Movements Neck, shoulders, hips: None, normal, Overall Severity Severity of abnormal movements (highest score from questions above): None, normal Incapacitation due to abnormal movements: None, normal Patient's awareness of abnormal movements (rate only patient's report): No Awareness, Dental Status Current problems with teeth and/or dentures?: No Does patient usually wear dentures?: No  CIWA:    COWS:     Musculoskeletal: Strength & Muscle Tone: within normal limits Gait & Station: normal Patient leans: N/A  Psychiatric Specialty Exam: Physical Exam Vitals and nursing note reviewed.  HENT:     Head: Normocephalic.     Nose: Nose normal.     Mouth/Throat:     Pharynx: Oropharynx is clear.  Eyes:     Pupils: Pupils are equal, round, and reactive to light.  Cardiovascular:     Rate and Rhythm: Normal rate.     Pulses: Normal pulses.  Pulmonary:     Effort: Pulmonary effort is normal.  Genitourinary:    Comments: Deferred Musculoskeletal:        General: Normal range of motion.     Cervical back: Normal range of motion.  Skin:    General: Skin is warm and dry.  Neurological:     Mental Status: She is alert and oriented to person, place, and time. Mental status is at baseline.     Review of Systems  Constitutional: Negative for chills, diaphoresis and fever.  HENT: Negative for congestion, rhinorrhea, sneezing and sore throat.   Eyes: Negative for discharge.  Respiratory: Negative for cough, chest tightness, shortness of breath and wheezing.   Cardiovascular: Negative for chest pain and palpitations.  Gastrointestinal: Negative for diarrhea,  nausea and vomiting.  Endocrine: Negative for cold intolerance.  Genitourinary: Negative for difficulty urinating.  Musculoskeletal: Negative for arthralgias and myalgias.  Skin: Negative for color change.  Allergic/Immunologic: Positive for environmental allergies (Poison ivy). Negative for food allergies.       Allergies: Poison Ivy: Hives, Itching, Rash  Sulfa Antibiotics.       Neurological: Negative for dizziness, tremors, seizures, syncope, facial asymmetry, speech difficulty, weakness, light-headedness, numbness and headaches.  Hematological: Negative for adenopathy. Does not bruise/bleed easily.  Psychiatric/Behavioral: Positive for dysphoric mood (Stabilized with medication prior to discharge) and sleep disturbance (Stabilized with medication prior to discharge). Negative for agitation, behavioral problems, confusion, decreased concentration, hallucinations, self-injury and suicidal ideas. The patient is nervous/anxious (Stable upon discharge). The patient is not hyperactive.     Blood pressure (!) 100/59, pulse 65, temperature 98.2 F (36.8 C),  temperature source Oral, resp. rate 20, height '5\' 4"'  (1.626 m), weight 93.9 kg, last menstrual period 06/01/2020, SpO2 100 %.Body mass index is 35.53 kg/m.  See Md's discharge SRA  Sleep:  Number of Hours: 6.5   Have you used any form of tobacco in the last 30 days? (Cigarettes, Smokeless Tobacco, Cigars, and/or Pipes): No  Has this patient used any form of tobacco in the last 30 days? (Cigarettes, Smokeless Tobacco, Cigars, and/or Pipes): NA  Blood Alcohol level:  Lab Results  Component Value Date   ETH <10 06/02/2020   ETH <10 95/18/8416    Metabolic Disorder Labs:  Lab Results  Component Value Date   HGBA1C 5.0 05/05/2020   MPG 96.8 05/05/2020   MPG 103 06/18/2009   Lab Results  Component Value Date   PROLACTIN 33.1 (H) 06/02/2020   Lab Results  Component Value Date   CHOL 148 06/02/2020   TRIG 117 06/02/2020   HDL 38  (L) 06/02/2020   CHOLHDL 3.9 06/02/2020   VLDL 23 06/02/2020   LDLCALC 87 06/02/2020   LDLCALC 125 (H) 05/05/2020   See Psychiatric Specialty Exam and Suicide Risk Assessment completed by Attending Physician prior to discharge.  Discharge destination:  Home  Is patient on multiple antipsychotic therapies at discharge:  No   Has Patient had three or more failed trials of antipsychotic monotherapy by history:  No  Recommended Plan for Multiple Antipsychotic Therapies: NA  Allergies as of 06/04/2020      Reactions   Poison Ivy Extract [poison Ivy Extract] Hives, Itching, Rash   Sulfa Antibiotics Itching, Swelling, Rash      Medication List    STOP taking these medications   dicyclomine 20 MG tablet Commonly known as: BENTYL   hydrOXYzine 25 MG capsule Commonly known as: VISTARIL   lithium carbonate 450 MG CR tablet Commonly known as: ESKALITH   methocarbamol 500 MG tablet Commonly known as: ROBAXIN     TAKE these medications     Indication  Abilify Maintena 400 MG Srer injection Generic drug: ARIPiprazole ER Inject 1 mL (200 mg total) into the muscle every 30 (thirty) days. (Due on 06-27-20): For mood control Start taking on: June 27, 2020 What changed: additional instructions  Indication: Manic-Depression   albuterol 108 (90 Base) MCG/ACT inhaler Commonly known as: VENTOLIN HFA Inhale 1-2 puffs into the lungs every 6 (six) hours as needed for wheezing or shortness of breath.  Indication: Asthma   citalopram 20 MG tablet Commonly known as: CELEXA Take 1 tablet (20 mg total) by mouth daily. For depression What changed: additional instructions  Indication: Depression   hydrOXYzine 25 MG tablet Commonly known as: ATARAX/VISTARIL Take 1 tablet (25 mg total) by mouth 3 (three) times daily as needed for anxiety.  Indication: Feeling Anxious   levothyroxine 50 MCG tablet Commonly known as: SYNTHROID Take 50 mcg by mouth daily before breakfast.  Indication:  Underactive Thyroid   melatonin 5 MG Tabs Take 1 tablet (5 mg total) by mouth at bedtime. For sleep  Indication: Trouble Sleeping       Follow-up Information    [facility] Follow up.   Why: Follow up with ACTT services. Contact information: [address] [place] [address] [phone]              Follow-up recommendations: Activity:  As tolerated Diet: As recommended by your primary care doctor. Keep all scheduled follow-up appointments as recommended.  Comments: Prescriptions given at discharge.  Patient agreeable to plan.  Given opportunity to ask questions.  Appears to feel comfortable with discharge denies any current suicidal or homicidal thought. Patient is also instructed prior to discharge to: Take all medications as prescribed by his/her mental healthcare provider. Report any adverse effects and or reactions from the medicines to his/her outpatient provider promptly. Patient has been instructed & cautioned: To not engage in alcohol and or illegal drug use while on prescription medicines. In the event of worsening symptoms, patient is instructed to call the crisis hotline, 911 and or go to the nearest ED for appropriate evaluation and treatment of symptoms. To follow-up with his/her primary care provider for your other medical issues, concerns and or health care needs.  Signed: Lindell Spar, NP, PMHNP, FNP-BC 06/04/2020, 3:46 PM

## 2020-06-04 NOTE — [facility] Suicide Risk Assessment (Signed)
[facility] INPATIENT:  Family/Significant Other Suicide Prevention Education  Suicide Prevention Education:  Education Completed; Amanda [name] with [facility] [phone]),  as been identified by the patient as the family member/significant other with whom the patient will be residing, and identified as the person(s) who will aid the patient in the event of a mental health crisis (suicidal ideations/suicide attempt).  With written consent from the patient, the family member/significant other has been provided the following suicide prevention education, prior to the and/or following the discharge of the patient.  The suicide prevention education provided includes the following:  Suicide risk factors  Suicide prevention and interventions  National Suicide Hotline telephone number  [facility] assessment telephone number  [facility] Emergency Assistance 911  [facility] and/or Residential Mobile Crisis Unit telephone number  Request made of family/significant other to:  Remove weapons (e.g., guns, rifles, knives), all items previously/currently identified as safety concern.    Remove drugs/medications (over-the-counter, prescriptions, illicit drugs), all items previously/currently identified as a safety concern.  The family member/significant other verbalizes understanding of the suicide prevention education information provided. The family member/significant other agrees to remove the items of safety concern listed above. CSW spoke with agency and they were aware of Pt's admission. CSW notified Amanda [name] of Pt's discharge. He reports to pending safety concerns as it relates to Pt returning home. Amanda [name] was able to confirm that Pt will be seen "if not today, tomorrow" by a member of the ACT Team. Pt was provided with [facility] pamphlet. Note that Pt did give consent for mother Amanda [name] [phone]) to be contacted. CSW did make attempt to contact her; VM message was  left.  Jacinta Shoe, MSW, LCSW 06/04/2020, 11:46 AM

## 2020-06-04 NOTE — [facility] Suicide Risk Assessment (Signed)
[facility] Discharge Suicide Risk Assessment   Principal Problem: <principal problem not specified> Discharge Diagnoses: Active Problems:   PTSD (post-traumatic stress disorder)   Autism spectrum   ADHD (attention deficit hyperactivity disorder)   Suicidal ideation   Bipolar 1 disorder ([facility])   Total Time spent with patient: 15 minutes  Musculoskeletal: Strength & Muscle Tone: within normal limits Gait & Station: normal Patient leans: N/A  Psychiatric Specialty Exam: Review of Systems  All other systems reviewed and are negative.   Blood pressure (!) 100/59, pulse 65, temperature 98.2 F (36.8 C), temperature source Oral, resp. rate 20, height 5\' 4"  (1.626 m), weight 93.9 kg, last menstrual period 06/01/2020, SpO2 100 %.Body mass index is 35.53 kg/m.  General Appearance: Casual  Eye Contact::  Good  Speech:  Normal Rate409  Volume:  Normal  Mood:  Euthymic  Affect:  Congruent  Thought Process:  Coherent and Descriptions of Associations: Circumstantial  Orientation:  Full (Time, Place, and Person)  Thought Content:  Logical  Suicidal Thoughts:  No  Homicidal Thoughts:  No  Memory:  Immediate;   Fair Recent;   Fair Remote;   Fair  Judgement:  Intact  Insight:  Lacking  Psychomotor Activity:  Normal  Concentration:  Fair  Recall:  [id number] of Knowledge:Fair  Language: Good  Akathisia:  Negative  Handed:  Right  AIMS (if indicated):     Assets:  Desire for Improvement Resilience  Sleep:  Number of Hours: 6.5  Cognition: WNL  ADL's:  Intact   Mental Status Per Nursing Assessment::   On Admission:  Suicidal ideation indicated by patient, Suicidal ideation indicated by others, Suicide plan, Self-harm thoughts, Self-harm behaviors  Demographic Factors:  Adolescent or young adult, Caucasian and Living alone  Loss Factors: NA  Historical Factors: Impulsivity  Risk Reduction Factors:   Positive therapeutic relationship  Continued Clinical Symptoms:  Bipolar  Disorder:   Depressive phase  Cognitive Features That Contribute To Risk:  None    Suicide Risk:  Minimal: No identifiable suicidal ideation.  Patients presenting with no risk factors but with morbid ruminations; may be classified as minimal risk based on the severity of the depressive symptoms   Follow-up Information    [facility] Follow up.   Why: Follow up with ACTT services. Contact information: [address] [place] [phone]               Plan Of Care/Follow-up recommendations:  Activity:  ad lib  [phone], MD 06/04/2020, 8:03 AM

## 2020-06-04 NOTE — [facility] Suicide Risk Assessment (Signed)
[facility] Discharge Suicide Risk Assessment   Principal Problem: <principal problem not specified> Discharge Diagnoses: Active Problems:   PTSD (post-traumatic stress disorder)   Autism spectrum   ADHD (attention deficit hyperactivity disorder)   Suicidal ideation   Bipolar 1 disorder ([facility])   Total Time spent with patient: 15 minutes  Musculoskeletal: Strength & Muscle Tone: within normal limits Gait & Station: normal Patient leans: N/A  Psychiatric Specialty Exam: Review of Systems  All other systems reviewed and are negative.   Blood pressure (!) 100/59, pulse 65, temperature 98.2 F (36.8 C), temperature source Oral, resp. rate 20, height 5\' 4"  (1.626 m), weight 93.9 kg, last menstrual period 06/01/2020, SpO2 100 %.Body mass index is 35.53 kg/m.  General Appearance: Casual  Eye Contact::  Fair  Speech:  Normal Rate409  Volume:  Normal  Mood:  Euthymic  Affect:  Congruent  Thought Process:  Coherent and Descriptions of Associations: Circumstantial  Orientation:  Full (Time, Place, and Person)  Thought Content:  Logical  Suicidal Thoughts:  No  Homicidal Thoughts:  No  Memory:  Immediate;   Fair Recent;   Fair Remote;   Fair  Judgement:  Intact  Insight:  Lacking  Psychomotor Activity:  Normal  Concentration:  Fair  Recall:  [id number] of Knowledge:Fair  Language: Good  Akathisia:  Negative  Handed:  Right  AIMS (if indicated):     Assets:  Desire for Improvement Resilience  Sleep:  Number of Hours: 6.5  Cognition: WNL  ADL's:  Intact   Mental Status Per Nursing Assessment::   On Admission:  Suicidal ideation indicated by patient, Suicidal ideation indicated by others, Suicide plan, Self-harm thoughts, Self-harm behaviors  Demographic Factors:  Caucasian, Low socioeconomic status, Living alone and Unemployed  Loss Factors: NA  Historical Factors: Impulsivity  Risk Reduction Factors:   Positive social support and Positive therapeutic  relationship  Continued Clinical Symptoms:  Bipolar Disorder:   Depressive phase Personality Disorders:   Cluster B  Cognitive Features That Contribute To Risk:  None    Suicide Risk:  Minimal: No identifiable suicidal ideation.  Patients presenting with no risk factors but with morbid ruminations; may be classified as minimal risk based on the severity of the depressive symptoms   Follow-up Information    [facility] Follow up.   Why: Follow up with ACTT services. Contact information: [address] [place] [phone]               Plan Of Care/Follow-up recommendations:  Activity:  ad lib  [phone], MD 06/04/2020, 9:40 AM

## 2020-06-04 NOTE — Progress Notes (Signed)
   06/04/20 0205  COVID-19 Daily Checkoff  Have you had a fever (temp > 37.80C/100F)  in the past 24 hours?  No  If you have had runny nose, nasal congestion, sneezing in the past 24 hours, has it worsened? No  COVID-19 EXPOSURE  Have you traveled outside the state in the past 14 days? No  Have you been in contact with someone with a confirmed diagnosis of COVID-19 or PUI in the past 14 days without wearing appropriate PPE? No  Have you been living in the same home as a person with confirmed diagnosis of COVID-19 or a PUI (household contact)? No  Have you been diagnosed with COVID-19? No

## 2020-06-04 NOTE — Progress Notes (Signed)
D:  Patient's self inventory sheet, patient  Has poor sleep, sleep medication not helpful.  Fair appetite, normal energy level, poor concentration.  Denied depression and hopeless.  Rated anxiety #2.  Denied withdrawals.  Denied SI.  Denied physical problems.  Denied physical pain.  Goal is great day.  Plans to discharge.  Does have discharge plans. A:  Medications administered per MD orders.  Emotional support and encouragement given patient. R:  Denied SI and HI, contracts for safety.  Denied A/V hallucinations.  Safety maintained with 15 minute checks.

## 2020-06-04 NOTE — Progress Notes (Signed)
   06/03/20 2100  Psych Admission Type (Psych Patients Only)  Admission Status Voluntary  Psychosocial Assessment  Patient Complaints Anxiety  Eye Contact Fair  Facial Expression Anxious;Animated  Affect Apprehensive;Appropriate to circumstance;Anxious;Depressed  Speech Journalist, newspaper;Restless  Appearance/Hygiene Improved  Behavior Characteristics Cooperative  Mood Anxious  Thought Process  Content WDL  Delusions None reported or observed  Perception WDL  Hallucination None reported or observed  Judgment Poor  Confusion None  Danger to Self  Current suicidal ideation? Passive  Self-Injurious Behavior No self-injurious ideation or behavior indicators observed or expressed   Agreement Not to Harm Self Yes  Description of Agreement verbal  Danger to Others  Danger to Others None reported or observed  Prn vistaril given at hs for anxiety was effective.

## 2020-06-04 NOTE — Progress Notes (Signed)
  [facility] Adult Case Management Discharge Plan :  Will you be returning to the same living situation after discharge:  Yes,  independently in apartment. At discharge, do you have transportation home?: Yes,  Pt will utilize bus system. Pt provided with bus pass. Do you have the ability to pay for your medications: Yes,  Pt is insured.  Release of information consent forms completed and in the chart;  Patient's signature needed at discharge.  Patient to Follow up at:  Follow-up Information    [facility] Follow up.   Why: Follow up with ACTT services. Contact information: [address] [place] [address] [phone]               Next level of care provider has access to [facility]:no  Safety Planning and Suicide Prevention discussed: Yes,  Pt provided with SPI pamphlet. Mother and ACT Team were both contacted priro to Pt discharing.  Have you used any form of tobacco in the last 30 days? (Cigarettes, Smokeless Tobacco, Cigars, and/or Pipes): No  Has patient been referred to the Quitline?: N/A patient is not a smoker  Patient has been referred for addiction treatment: N/A  Jacinta Shoe, LCSW 06/04/2020, 11:51 AM

## 2020-06-12 ENCOUNTER — Encounter (HOSPITAL_COMMUNITY): Payer: Self-pay | Admitting: Emergency Medicine

## 2020-06-12 ENCOUNTER — Inpatient Hospital Stay (HOSPITAL_COMMUNITY)
Admission: EM | Admit: 2020-06-12 | Discharge: 2020-06-12 | Disposition: A | Discharge status: Home / Self Care | Payer: Medicaid Other | Attending: Obstetrics and Gynecology | Admitting: Obstetrics and Gynecology

## 2020-06-12 ENCOUNTER — Other Ambulatory Visit: Payer: Self-pay

## 2020-06-12 DIAGNOSIS — R102 Pelvic and perineal pain: Secondary | ICD-10-CM

## 2020-06-12 DIAGNOSIS — Z3202 Encounter for pregnancy test, result negative: Secondary | ICD-10-CM

## 2020-06-12 DIAGNOSIS — R109 Unspecified abdominal pain: Secondary | ICD-10-CM

## 2020-06-12 DIAGNOSIS — R1030 Lower abdominal pain, unspecified: Secondary | ICD-10-CM | POA: Insufficient documentation

## 2020-06-12 LAB — POCT PREGNANCY, URINE: Preg Test, Ur: NEGATIVE

## 2020-06-12 NOTE — MAU Provider Note (Addendum)
°  Ms. Amanda [name] is a 19 y.o. female who presents to MAU today with complaint of possible "shifting of IUD". She reports having IC recently and partner felt a poking sensation. She went to her PCP yesterday, had an xray and was told IUD may be out of place. She did not have a pelvic exam. Her PCP placed referral to OBGYN but she has not been set up with appt yet. Endorses low abdominal cramping and LBP. Rates pain 6/10. Has not tried anything for it. Reports having IUD placed las year, unsure of which one.   O BP 112/67 (BP Location: Left Arm)    Pulse 71    Temp 97.9 F (36.6 C) (Oral)    Resp 18    Ht 5\' 4"  (1.626 m)    Wt 95 kg    LMP 06/01/2020 (Exact Date)    SpO2 100%    BMI 35.96 kg/m  Physical Exam Vitals and nursing note reviewed.  Constitutional:      General: She is not in acute distress (appears comfortable). HENT:     Head: Normocephalic.  Cardiovascular:     Rate and Rhythm: Normal rate.  Pulmonary:     Effort: Pulmonary effort is normal. No respiratory distress.  Musculoskeletal:        General: Normal range of motion.     Cervical back: Normal range of motion.  Neurological:     General: No focal deficit present.     Mental Status: She is alert and oriented to person, place, and time.  Psychiatric:        Mood and Affect: Mood is anxious.        Behavior: Behavior normal.    Results for orders placed or performed during the hospital encounter of 06/12/20 (from the past 24 hour(s))  Pregnancy, urine POC     Status: None   Collection Time: 06/12/20 11:39 AM  Result Value Ref Range   Preg Test, Ur NEGATIVE NEGATIVE   MDM: No emergent condition identified. Pt informed would need pelvic exam and/or pelvis 06/14/20 to confirm if IUD is in place or malpositioned. Recommend f/u with OBGYN to evaluate placement of IUD. Ibuprofen or Aleve prn pain. Stable for discharge home.  A Non pregnant female Abdominal cramping Medical screening exam complete  P Discharge from MAU  in stable condition Follow up with OBGYN Patient may return to MAU as needed for pregnancy related complaints  [place], CNM 06/12/2020 12:11 PM

## 2020-06-12 NOTE — ED Triage Notes (Signed)
Pt states her PCP sent her to ED to have IUD removed due to pelvic cramping and x-ray showing it has shifted.  PA called for MSE for MAU.

## 2020-06-12 NOTE — MAU Note (Signed)
Sent from [facility] ED secondary abdominal cramping and IUD removal.  Reports PCP referred pt to OB/Gyn but pt can't be seen by OB/Gyn received referral.  Pt opted to be seen in ED secondary not knowing how long it will take to get Ob/Gyn appointment.

## 2020-06-12 NOTE — ED Triage Notes (Signed)
Emergency Medicine Provider OB Triage Evaluation Note  Amanda [name] is a 19 y.o. female, No obstetric history on file., at Unknown gestation who presents to the emergency department with complaints of pelvic cramping and possible displacement of IUD.  Had an x-ray done yesterday at her PCPs office which showed that "my IUD may not have been in the right place."  She was told to come to the ER to have the IUD removed.  Review of  Systems  Positive: Abdominal cramping, back pain Negative: Fever  Physical Exam  BP 127/76 (BP Location: Right Arm)   Pulse 82   Temp 98.5 F (36.9 C) (Oral)   Resp 18   Ht 5\' 4"  (1.626 m)   Wt 93.4 kg   LMP 06/01/2020 (Exact Date)   SpO2 100%   BMI 35.36 kg/m  General: Awake, no distress  HEENT: Atraumatic  Resp: Normal effort  Cardiac: Normal rate Abd: Nondistended, nontender  MSK: Moves all extremities without difficulty Neuro: Speech clear  Medical Decision Making  Pt evaluated for pregnancy concern and is stable for transfer to MAU. Pt is in agreement with plan for transfer.  10:27 AM Discussed with MAU APP, who accepts patient in transfer. I am unable to view the x-rays that were done yesterday in PCPs office showing the placement of the IUD.  I discussed with the MAU APP who states that she should be transferred over to MAU as she does not currently have an OB/GYN.  Patient is agreeable to the plan.  Clinical Impression  No diagnosis found.     08/01/2020, PA-C 06/12/20 1029

## 2020-06-23 ENCOUNTER — Other Ambulatory Visit: Payer: Self-pay

## 2020-06-23 ENCOUNTER — Ambulatory Visit (HOSPITAL_COMMUNITY)
Admission: RE | Admit: 2020-06-23 | Discharge: 2020-06-23 | Disposition: A | Discharge status: Home / Self Care | Payer: Medicaid Other | Source: Home / Self Care | Attending: Psychiatry | Admitting: Psychiatry

## 2020-06-23 DIAGNOSIS — R45851 Suicidal ideations: Secondary | ICD-10-CM | POA: Insufficient documentation

## 2020-06-23 DIAGNOSIS — F4310 Post-traumatic stress disorder, unspecified: Secondary | ICD-10-CM | POA: Insufficient documentation

## 2020-06-23 DIAGNOSIS — F913 Oppositional defiant disorder: Secondary | ICD-10-CM | POA: Insufficient documentation

## 2020-06-23 DIAGNOSIS — F909 Attention-deficit hyperactivity disorder, unspecified type: Secondary | ICD-10-CM | POA: Insufficient documentation

## 2020-06-23 DIAGNOSIS — F319 Bipolar disorder, unspecified: Secondary | ICD-10-CM | POA: Insufficient documentation

## 2020-06-23 DIAGNOSIS — Z20822 Contact with and (suspected) exposure to covid-19: Secondary | ICD-10-CM | POA: Insufficient documentation
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
## 2020-06-23 NOTE — H&P (Addendum)
Behavioral Health Medical Screening Exam  Amanda [name] is an 19 y.o. female.   Patient will be transferred to [facility] for continuous assessment.  Total Time spent with patient: 15 minutes  Psychiatric Specialty Exam: Physical Exam Constitutional:      General: She is not in acute distress.    Appearance: She is not ill-appearing, toxic-appearing or diaphoretic.  Neurological:     Mental Status: She is alert and oriented to person, place, and time.  Psychiatric:        Mood and Affect: Mood is anxious and depressed.        Speech: Speech normal.        Behavior: Behavior is cooperative.        Thought Content: Thought content includes suicidal ideation. Thought content includes suicidal plan.    Review of Systems  Constitutional: Negative for chills, diaphoresis and fever.  Respiratory: Negative for cough and shortness of breath.   Cardiovascular: Negative for chest pain and palpitations.  Gastrointestinal: Negative for diarrhea, nausea and vomiting.  Neurological: Negative for dizziness.  Psychiatric/Behavioral: Positive for dysphoric mood, sleep disturbance and suicidal ideas. The patient is nervous/anxious.    Blood pressure 99/64, pulse 92, temperature 98.5 F (36.9 C), temperature source Oral, resp. rate 20, last menstrual period 06/01/2020.There is no height or weight on file to calculate BMI. General Appearance: Casual Eye Contact:  Good Speech:  Clear and Coherent and Normal Rate Volume:  Normal Mood:  Dysphoric, Hopeless and Worthless Affect:  Congruent and Depressed Thought Process:  Coherent, Goal Directed, Linear and Descriptions of Associations: Intact Orientation:  Full (Time, Place, and Person) Thought Content:  Logical Suicidal Thoughts:  Yes.  with intent/plan Homicidal Thoughts:  No Memory:  Immediate;   Good Recent;   Good Judgement:  Intact Insight:  Lacking Psychomotor Activity:  Normal Concentration: Concentration: Fair and Attention Span:  Fair Recall:  Good Fund of Knowledge:Good Language: Good Akathisia:  Negative Handed:  Right AIMS (if indicated):    Assets:  Communication Skills Desire for Improvement Financial Resources/Insurance Physical Health Sleep:       Blood pressure 99/64, pulse 92, temperature 98.5 F (36.9 C), temperature source Oral, resp. rate 20, last menstrual period 06/01/2020.  Recommendations: Based on my evaluation the patient does not appear to have an emergency medical condition.   Disposition: Patient will be transferred to [facility] for continuous assessment.   Jackelyn Poling, NP 06/23/2020, 11:45 PM

## 2020-06-24 ENCOUNTER — Ambulatory Visit (HOSPITAL_COMMUNITY)
Admission: EM | Admit: 2020-06-24 | Discharge: 2020-06-24 | Disposition: A | Discharge status: Home / Self Care | Payer: Medicaid Other | Attending: Nurse Practitioner | Admitting: Nurse Practitioner

## 2020-06-24 ENCOUNTER — Other Ambulatory Visit: Payer: Self-pay

## 2020-06-24 ENCOUNTER — Encounter (HOSPITAL_COMMUNITY): Payer: Self-pay | Admitting: Emergency Medicine

## 2020-06-24 DIAGNOSIS — R45851 Suicidal ideations: Secondary | ICD-10-CM

## 2020-06-24 DIAGNOSIS — F603 Borderline personality disorder: Secondary | ICD-10-CM

## 2020-06-24 DIAGNOSIS — F319 Bipolar disorder, unspecified: Secondary | ICD-10-CM

## 2020-06-24 LAB — CBC WITH DIFFERENTIAL/PLATELET
Abs Immature Granulocytes: 0.04 10*3/uL (ref 0.00–0.07)
Basophils Absolute: 0 10*3/uL (ref 0.0–0.1)
Basophils Relative: 0 %
Eosinophils Absolute: 0 10*3/uL (ref 0.0–0.5)
Eosinophils Relative: 0 %
HCT: 36.9 % (ref 36.0–46.0)
Hemoglobin: 12.3 g/dL (ref 12.0–15.0)
Immature Granulocytes: 0 %
Lymphocytes Relative: 32 %
Lymphs Abs: 3.8 10*3/uL (ref 0.7–4.0)
MCH: 29.5 pg (ref 26.0–34.0)
MCHC: 33.3 g/dL (ref 30.0–36.0)
MCV: 88.5 fL (ref 80.0–100.0)
Monocytes Absolute: 0.6 10*3/uL (ref 0.1–1.0)
Monocytes Relative: 5 %
Neutro Abs: 7.4 10*3/uL (ref 1.7–7.7)
Neutrophils Relative %: 63 %
Platelets: 273 10*3/uL (ref 150–400)
RBC: 4.17 MIL/uL (ref 3.87–5.11)
RDW: 12.3 % (ref 11.5–15.5)
WBC: 11.8 10*3/uL — ABNORMAL HIGH (ref 4.0–10.5)
nRBC: 0 % (ref 0.0–0.2)

## 2020-06-24 LAB — COMPREHENSIVE METABOLIC PANEL
ALT: 18 U/L (ref 0–44)
AST: 16 U/L (ref 15–41)
Albumin: 3.9 g/dL (ref 3.5–5.0)
Alkaline Phosphatase: 53 U/L (ref 38–126)
Anion gap: 8 (ref 5–15)
BUN: 21 mg/dL — ABNORMAL HIGH (ref 6–20)
CO2: 22 mmol/L (ref 22–32)
Calcium: 9.8 mg/dL (ref 8.9–10.3)
Chloride: 108 mmol/L (ref 98–111)
Creatinine, Ser: 0.74 mg/dL (ref 0.44–1.00)
GFR calc Af Amer: >60 mL/min (ref >60)
GFR calc non Af Amer: >60 mL/min (ref >60)
Glucose, Bld: 104 mg/dL — ABNORMAL HIGH (ref 70–99)
Potassium: 3.8 mmol/L (ref 3.5–5.1)
Sodium: 138 mmol/L (ref 135–145)
Total Bilirubin: 0.3 mg/dL (ref 0.3–1.2)
Total Protein: 7 g/dL (ref 6.5–8.1)

## 2020-06-24 LAB — POCT URINE DRUG SCREEN - MANUAL ENTRY (I-SCREEN)
POC Amphetamine UR: NOT DETECTED
POC Buprenorphine (BUP): NOT DETECTED
POC Cocaine UR: NOT DETECTED
POC Marijuana UR: NOT DETECTED
POC Methadone UR: NOT DETECTED
POC Methamphetamine UR: NOT DETECTED
POC Morphine: NOT DETECTED
POC Oxazepam (BZO): NOT DETECTED
POC Oxycodone UR: NOT DETECTED
POC Secobarbital (BAR): NOT DETECTED

## 2020-06-24 LAB — RESPIRATORY PANEL BY RT PCR (FLU A&B, COVID)
Influenza A by PCR: NEGATIVE
Influenza B by PCR: NEGATIVE
SARS Coronavirus 2 by RT PCR: NEGATIVE

## 2020-06-24 LAB — POCT PREGNANCY, URINE: Preg Test, Ur: NEGATIVE

## 2020-06-24 LAB — POC SARS CORONAVIRUS 2 AG -  ED: SARS Coronavirus 2 Ag: NEGATIVE

## 2020-06-24 MED ORDER — CITALOPRAM HYDROBROMIDE 20 MG PO TABS
20.0000 mg | ORAL_TABLET | Freq: Every day | ORAL | Status: DC
  Administered 2020-06-24: 20 mg via ORAL
  Filled 2020-06-24: qty 1

## 2020-06-24 MED ORDER — ALUM & MAG HYDROXIDE-SIMETH 200-200-20 MG/5ML PO SUSP: 30.0000 mL | ORAL | Status: DC | PRN

## 2020-06-24 MED ORDER — HYDROXYZINE HCL 25 MG PO TABS: 25.0000 mg | ORAL_TABLET | Freq: Three times a day (TID) | ORAL | Status: DC | PRN

## 2020-06-24 MED ORDER — ACETAMINOPHEN 325 MG PO TABS
650.0000 mg | ORAL_TABLET | Freq: Four times a day (QID) | ORAL | Status: DC | PRN
  Administered 2020-06-24: 650 mg via ORAL
  Filled 2020-06-24: qty 2

## 2020-06-24 MED ORDER — MELATONIN 5 MG PO TABS
5.0000 mg | ORAL_TABLET | Freq: Every day | ORAL | Status: DC
  Administered 2020-06-24: 5 mg via ORAL
  Filled 2020-06-24: qty 1

## 2020-06-24 MED ORDER — ALBUTEROL SULFATE HFA 108 (90 BASE) MCG/ACT IN AERS: 1.0000 | INHALATION_SPRAY | Freq: Four times a day (QID) | RESPIRATORY_TRACT | Status: DC | PRN

## 2020-06-24 MED ORDER — MAGNESIUM HYDROXIDE 400 MG/5ML PO SUSP: 30.0000 mL | Freq: Every day | ORAL | Status: DC | PRN

## 2020-06-24 NOTE — ED Notes (Signed)
Patient A&O x 4, ambulatory. Patient discharged in no acute distress. Patient denied SI/HI, A/VH upon discharge. Patient verbalized understanding of all discharge instructions explained by staff, to include follow up appointments and safety plan. Patient reported mood 10/10.  Pt belongings returned to patient from locker # 5 intact. Patient escorted to lobby via staff for transport to destination. Safety maintained.  

## 2020-06-24 NOTE — ED Notes (Signed)
Pt sleeping at present, no distress noted, monitoring for safety. 

## 2020-06-24 NOTE — ED Notes (Signed)
Pt denies SI/HI, calm, cooperative with staff. Interact well with peer. Denies A/VH. Safety maintained.

## 2020-06-24 NOTE — ED Notes (Signed)
Pt given breakfast: nutrigrain bar and Coke

## 2020-06-24 NOTE — ED Provider Notes (Signed)
[facility] ASAP Discharge Summary  Date and Time: 06/24/2020 10:56 AM  Name: Amanda [name]  MRN:  [id number]   Discharge Diagnoses:  Final diagnoses:  Bipolar 1 disorder, depressed (HCC)  Suicidal ideation    Subjective: Patient reports today that she is doing okay.  She states that [facility] and sees Dr. Jeannine Kitten.  She states that she has been taking her medications as they are prescribed.  She states that one of her biggest triggers with her mother and that she does not have a way to get away from her mother and actually does not want to because she wants her mother to stay close to her.  She reports that she likes her act team but feels that her therapist has not been out to see her enough recently.  She is me permission to contact her act team and Dr. Jeannine Kitten.  She also reports that she thinks that Dr. Jeannine Kitten was going to switch her medications up and possibly give her Ativan.  She denies any suicidal homicidal ideations and denies any hallucinations today if a plan is established for her medication adjustments as well as her therapist coming to see her more. Dr. Jeannine Kitten contacted and he reports that he is very familiar with the patient.  He states that they do not feel that she has a threat to herself or to anyone else to be discharged home.  He states that he is planning on switching her Celexa to Prozac and that he has not offered her Ativan.  He states that he will take care of her medication changes and that he has spoken to his team and she will receive her Abilify Maintena injection later this week.  He states that he will have a conversation with the therapist to ensure that she has increased visits.  Stay Summary: Patient is a 19 year old female with a history of bipolar disorder, depression, PTSD, ODD, and ADHD that presented to [facility] as a walk-in reporting worsening depression and suicidal ideations with thoughts of overdosing.  Patient reported she has an ACT team and they  informed her to come here.  Patient remained overnight and was restarted on her home medications.  Today the patient reported that it triggers her mother but that she cannot get away from her mother and that she wants her mother in her life.  She finally agrees that she needs to have some increased therapy to improve her coping skills.  Her ACT team was contacted and discussed with Dr. Jeannine Kitten about medications as well as increasing therapy appointments.  A plan was established and the patient was transported home via safe transport with her ACT team and knowledge of the patient being discharged today  Total Time spent with patient: 30 minutes  Past Psychiatric History: ADHD, ODD, Mood disorder Past Medical History:  Past Medical History:  Diagnosis Date  . Asthma   . Attention deficit hyperactivity disorder   . Depression   . Eczema   . Mood disorder (HCC)   . Oppositional defiant disorder     Past Surgical History:  Procedure Laterality Date  . ADENOIDECTOMY    . TYMPANOSTOMY TUBE PLACEMENT     Family History: History reviewed. No pertinent family history. Family Psychiatric History: None reported Social History:  Social History   Substance and Sexual Activity  Alcohol Use No     Social History   Substance and Sexual Activity  Drug Use No    Social History  Socioeconomic History  . Marital status: Single    Spouse name: Not on file  . Number of children: Not on file  . Years of education: Not on file  . Highest education level: Not on file  Occupational History  . Not on file  Tobacco Use  . Smoking status: Never Smoker  . Smokeless tobacco: Never Used  Substance and Sexual Activity  . Alcohol use: No  . Drug use: No  . Sexual activity: Not on file  Other Topics Concern  . Not on file  Social History Narrative  . Not on file   Social Determinants of Health   Financial Resource Strain:   . Difficulty of Paying Living Expenses: Not on file  Food Insecurity:    . Worried About Programme researcher, broadcasting/film/video in the Last Year: Not on file  . Ran Out of Food in the Last Year: Not on file  Transportation Needs:   . Lack of Transportation (Medical): Not on file  . Lack of Transportation (Non-Medical): Not on file  Physical Activity:   . Days of Exercise per Week: Not on file  . Minutes of Exercise per Session: Not on file  Stress:   . Feeling of Stress : Not on file  Social Connections:   . Frequency of Communication with Friends and Family: Not on file  . Frequency of Social Gatherings with Friends and Family: Not on file  . Attends Religious Services: Not on file  . Active Member of Clubs or Organizations: Not on file  . Attends Banker Meetings: Not on file  . Marital Status: Not on file   SDOH:  SDOH Screenings   Alcohol Screen: Low Risk   . Last Alcohol Screening Score (AUDIT): 2  Depression (PHQ2-9):   . PHQ-2 Score: Not on file  Financial Resource Strain:   . Difficulty of Paying Living Expenses: Not on file  Food Insecurity:   . Worried About Programme researcher, broadcasting/film/video in the Last Year: Not on file  . Ran Out of Food in the Last Year: Not on file  Housing:   . Last Housing Risk Score: Not on file  Physical Activity:   . Days of Exercise per Week: Not on file  . Minutes of Exercise per Session: Not on file  Social Connections:   . Frequency of Communication with Friends and Family: Not on file  . Frequency of Social Gatherings with Friends and Family: Not on file  . Attends Religious Services: Not on file  . Active Member of Clubs or Organizations: Not on file  . Attends Banker Meetings: Not on file  . Marital Status: Not on file  Stress:   . Feeling of Stress : Not on file  Tobacco Use: Low Risk   . Smoking Tobacco Use: Never Smoker  . Smokeless Tobacco Use: Never Used  Transportation Needs:   . Freight forwarder (Medical): Not on file  . Lack of Transportation (Non-Medical): Not on file    Has this  patient used any form of tobacco in the last 30 days? (Cigarettes, Smokeless Tobacco, Cigars, and/or Pipes) A prescription for an FDA-approved tobacco cessation medication was offered at discharge and the patient refused  Current Medications:  Current Facility-Administered Medications  Medication Dose Route Frequency Provider Last Rate Last Admin  . acetaminophen (TYLENOL) tablet 650 mg  650 mg Oral Q6H PRN Nira Conn A, NP   650 mg at 06/24/20 0955  . albuterol (VENTOLIN HFA) 108 (  90 Base) MCG/ACT inhaler 1-2 puff  1-2 puff Inhalation Q6H PRN Nira ConnBerry, Jason A, NP      . alum & mag hydroxide-simeth (MAALOX/MYLANTA) 200-200-20 MG/5ML suspension 30 mL  30 mL Oral Q4H PRN Nira ConnBerry, Jason A, NP      . citalopram (CELEXA) tablet 20 mg  20 mg Oral Daily Nira ConnBerry, Jason A, NP   20 mg at 06/24/20 0954  . hydrOXYzine (ATARAX/VISTARIL) tablet 25 mg  25 mg Oral TID PRN Nira ConnBerry, Jason A, NP      . magnesium hydroxide (MILK OF MAGNESIA) suspension 30 mL  30 mL Oral Daily PRN Nira ConnBerry, Jason A, NP      . melatonin tablet 5 mg  5 mg Oral QHS Nira ConnBerry, Jason A, NP   5 mg at 06/24/20 0102   Current Outpatient Medications  Medication Sig Dispense Refill  . [START ON 06/27/2020] ABILIFY MAINTENA 400 MG SRER injection Inject 1 mL (200 mg total) into the muscle every 30 (thirty) days. (Due on 06-27-20): For mood control 1 each 0  . albuterol (PROVENTIL HFA;VENTOLIN HFA) 108 (90 Base) MCG/ACT inhaler Inhale 1-2 puffs into the lungs every 6 (six) hours as needed for wheezing or shortness of breath.    . hydrOXYzine (ATARAX/VISTARIL) 25 MG tablet Take 1 tablet (25 mg total) by mouth 3 (three) times daily as needed for anxiety. (Patient taking differently: Take 25-50 mg by mouth daily as needed for anxiety. ) 75 tablet 0  . melatonin 5 MG TABS Take 1 tablet (5 mg total) by mouth at bedtime. For sleep 30 tablet 0  . citalopram (CELEXA) 20 MG tablet Take 1 tablet (20 mg total) by mouth daily. For depression (Patient not taking: Reported  on 06/24/2020) 30 tablet 0    PTA Medications: (Not in a hospital admission)   Musculoskeletal  Strength & Muscle Tone: within normal limits Gait & Station: normal Patient leans: N/A  Psychiatric Specialty Exam  Presentation  General Appearance: Appropriate for Environment;Casual  Eye Contact:Good  Speech:Clear and Coherent;Normal Rate  Speech Volume:Normal  Handedness:Right   Mood and Affect  Mood:Euthymic  Affect:Congruent;Appropriate   Thought Process  Thought Processes:Coherent  Descriptions of Associations:Intact  Orientation:Full (Time, Place and Person)  Thought Content:WDL  Hallucinations:Hallucinations: None  Ideas of Reference:None  Suicidal Thoughts:Suicidal Thoughts: No SI Active Intent and/or Plan: With Intent;With Plan;With Means to Carry Out  Homicidal Thoughts:Homicidal Thoughts: No   Sensorium  Memory:Immediate Good;Recent Good;Remote Good  Judgment:Intact  Insight:Fair   Executive Functions  Concentration:Good  Attention Span:Good  Recall:Good  Fund of Knowledge:Fair  Language:Good   Psychomotor Activity  Psychomotor Activity:Psychomotor Activity: Normal   Assets  Assets:Communication Skills;Desire for Improvement;Financial Resources/Insurance;Housing;Physical Health;Social Support;Transportation   Sleep  Sleep:Sleep: Good   Physical Exam  Physical Exam Vitals and nursing note reviewed.  Constitutional:      Appearance: She is well-developed.  HENT:     Head: Normocephalic.  Eyes:     Pupils: Pupils are equal, round, and reactive to light.  Cardiovascular:     Rate and Rhythm: Normal rate.  Pulmonary:     Effort: Pulmonary effort is normal.  Musculoskeletal:        General: Normal range of motion.  Neurological:     Mental Status: She is alert and oriented to person, place, and time.    Review of Systems  Constitutional: Negative.   HENT: Negative.   Eyes: Negative.   Respiratory: Negative.    Cardiovascular: Negative.   Gastrointestinal: Negative.   Genitourinary: Negative.  Musculoskeletal: Negative.   Skin: Negative.   Neurological: Negative.   Endo/Heme/Allergies: Negative.   Psychiatric/Behavioral: Negative.    Blood pressure 111/79, pulse 71, temperature 98.6 F (37 C), temperature source Oral, resp. rate 16, last menstrual period 06/01/2020, SpO2 100 %. There is no height or weight on file to calculate BMI.  Demographic Factors:  Adolescent or young adult, Caucasian and Living alone  Loss Factors: NA  Historical Factors: NA  Risk Reduction Factors:   Sense of responsibility to family, Living with another person, especially a relative, Positive social support, Positive therapeutic relationship and Positive coping skills or problem solving skills  Continued Clinical Symptoms:  Previous Psychiatric Diagnoses and Treatments  Cognitive Features That Contribute To Risk:  None    Suicide Risk:  Mild:  Suicidal ideation of limited frequency, intensity, duration, and specificity.  There are no identifiable plans, no associated intent, mild dysphoria and related symptoms, good self-control (both objective and subjective assessment), few other risk factors, and identifiable protective factors, including available and accessible social support.  Plan Of Care/Follow-up recommendations:  Continue activity as tolerated. Continue diet as recommended by your PCP. Ensure to keep all appointments with outpatient providers.  Disposition: Discharge home with follow up with [facility]  Gerlene Burdock Ravon Mortellaro, FNP 06/24/2020, 10:56 AM

## 2020-06-24 NOTE — ED Provider Notes (Signed)
Behavioral Health Admission [facility] & OBS)  Date: 06/24/20 Patient Name: Amanda [name] MRN: [id number] Chief Complaint:  Chief Complaint  Patient presents with   Suicidal      Diagnoses:  Final diagnoses:  Bipolar 1 disorder, depressed (HCC)  Suicidal ideation    HPI: Amanda [name] is a 19 y.o. female with a history of bipolar disorder, depression, PTSD, ODD, and ADHD who presented to [facility] as a walk-in due to suicidal ideations with thoughts of overdosing. She states that she contacted her [facility] crisis line and they encouraged her to come in for an assessment. She reports that she has been having suicidal thoughts for the past couple of days after seeing her mother who made some negative comments. She was inpatient at [facility] from 09/12-09/15. She states that she left her medications at a bus stop after discharge from [facility], so she has not been taking them. She receives Abilify Maintena monthly and her next injection is due 06/27/20. Patient is unable to contract for safety and was transferred to [facility] for continuous assessment.    On evaluation patient is alert and oriented x 4, pleasant, and cooperative. Speech is clear and coherent. Mood is depressed and affect is congruent with mood. Thought process is coherent and thought content is logical. Denies auditory and visual hallucinations. No indication that patient is responding to internal stimuli. No evidence of delusional thought content. Reports suicidal ideations with thoughts of overdosing. Denies homicidal ideations. Denies substance abuse.   PHQ 2-9:     Admission (Discharged) from [facility] from 11/17/2019 in [facility] EMERGENCY DEPARTMENT  C-SSRS RISK CATEGORY High Risk Error: Question 6 not populated       Total Time spent with patient: 20 minutes  Musculoskeletal  Strength & Muscle Tone: within normal limits Gait & Station: normal Patient  leans: N/A  Psychiatric Specialty Exam  Presentation General Appearance: Appropriate for Environment;Casual  Eye Contact:Good  Speech:Clear and Coherent  Speech Volume:Normal  Handedness:Right   Mood and Affect  Mood:Anxious;Euthymic  Affect:Appropriate;Depressed;Congruent   Thought Process  Thought Processes:Coherent;Goal Directed  Descriptions of Associations:Intact  Orientation:Full (Time, Place and Person)  Thought Content:Logical  Hallucinations:Hallucinations: None  Ideas of Reference:None  Suicidal Thoughts:Suicidal Thoughts: Yes, Active SI Active Intent and/or Plan: With Intent;With Plan;With Means to Carry Out  Homicidal Thoughts:Homicidal Thoughts: No   Sensorium  Memory:Immediate Good;Recent Good;Remote Good  Judgment:Intact  Insight:Lacking   Executive Functions  Concentration:Fair  Attention Span:Fair  Recall:Fair  Fund of Knowledge:Fair  Language:Good   Psychomotor Activity  Psychomotor Activity:Psychomotor Activity: Normal   Assets  Assets:Communication Skills;Desire for Improvement;Housing;Physical Health   Sleep  Sleep:Sleep: Fair   Physical Exam Constitutional:      General: She is not in acute distress.    Appearance: She is not ill-appearing, toxic-appearing or diaphoretic.  HENT:     Head: Normocephalic.     Right Ear: External ear normal.     Left Ear: External ear normal.  Eyes:     Conjunctiva/sclera: Conjunctivae normal.     Pupils: Pupils are equal, round, and reactive to light.  Cardiovascular:     Rate and Rhythm: Normal rate.  Pulmonary:     Effort: Pulmonary effort is normal. No respiratory distress.  Musculoskeletal:        General: Normal range of motion.  Skin:    General: Skin is warm and dry.  Neurological:     Mental Status: She is alert  and oriented to person, place, and time.  Psychiatric:        Mood and Affect: Mood is anxious and depressed.        Behavior: Behavior is cooperative.         Thought Content: Thought content is not paranoid or delusional. Thought content includes suicidal ideation. Thought content does not include homicidal ideation. Thought content includes suicidal plan.    Review of Systems  Constitutional: Negative for chills, diaphoresis, fever, malaise/fatigue and weight loss.  HENT: Negative for congestion.   Respiratory: Negative for cough and shortness of breath.   Cardiovascular: Negative for chest pain and palpitations.  Gastrointestinal: Negative for diarrhea, nausea and vomiting.  Neurological: Negative for dizziness and seizures.  Psychiatric/Behavioral: Positive for depression and suicidal ideas. Negative for hallucinations, memory loss and substance abuse. The patient is nervous/anxious and has insomnia.   All other systems reviewed and are negative.   Blood pressure 104/81, pulse 78, temperature 98.1 F (36.7 C), resp. rate 18, last menstrual period 06/01/2020, SpO2 99 %. There is no height or weight on file to calculate BMI.  Past Psychiatric History: She was inpatient at [facility] from 09/12-09/15. Previous admissions at [facility], [address], 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   Is the patient at risk to self? Yes  Has the patient been a risk to self in the past 6 months? Yes .    Has the patient been a risk to self within the distant past? Yes   Is the patient a risk to others? No   Has the patient been a risk to others in the past 6 months? No   Has the patient been a risk to others within the distant past? No   Past Medical History:  Past Medical History:  Diagnosis Date   Asthma    Attention deficit hyperactivity disorder    Depression    Eczema    Mood disorder (HCC)    Oppositional defiant disorder     Past Surgical History:  Procedure Laterality Date   ADENOIDECTOMY     TYMPANOSTOMY TUBE PLACEMENT      Family History: History reviewed. No pertinent family history.  Social History:  Social History    Socioeconomic History   Marital status: Single    Spouse name: Not on file   Number of children: Not on file   Years of education: Not on file   Highest education level: Not on file  Occupational History   Not on file  Tobacco Use   Smoking status: Never Smoker   Smokeless tobacco: Never Used  Substance and Sexual Activity   Alcohol use: No   Drug use: No   Sexual activity: Not on file  Other Topics Concern   Not on file  Social History Narrative   Not on file   Social Determinants of Health   Financial Resource Strain:    Difficulty of Paying Living Expenses: Not on file  Food Insecurity:    Worried About Running Out of Food in the Last Year: Not on file   Ran Out of Food in the Last Year: Not on file  Transportation Needs:    Lack of Transportation (Medical): Not on file   Lack of Transportation (Non-Medical): Not on file  Physical Activity:    Days of Exercise per Week: Not on file   Minutes of Exercise per Session: Not on file  Stress:    Feeling of Stress : Not on file  Social Connections:  Frequency of Communication with Friends and Family: Not on file   Frequency of Social Gatherings with Friends and Family: Not on file   Attends Religious Services: Not on file   Active Member of Clubs or Organizations: Not on file   Attends BankerClub or Organization Meetings: Not on file   Marital Status: Not on file  Intimate Partner Violence:    Fear of Current or Ex-Partner: Not on file   Emotionally Abused: Not on file   Physically Abused: Not on file   Sexually Abused: Not on file    SDOH:  SDOH Screenings   Alcohol Screen: Low Risk    Last Alcohol Screening Score (AUDIT): 2  Depression (PHQ2-9):    PHQ-2 Score: Not on file  Financial Resource Strain:    Difficulty of Paying Living Expenses: Not on file  Food Insecurity:    Worried About Programme researcher, broadcasting/film/videounning Out of Food in the Last Year: Not on file   Ran Out of Food in the Last Year: Not  on file  Housing:    Last Housing Risk Score: Not on file  Physical Activity:    Days of Exercise per Week: Not on file   Minutes of Exercise per Session: Not on file  Social Connections:    Frequency of Communication with Friends and Family: Not on file   Frequency of Social Gatherings with Friends and Family: Not on file   Attends Religious Services: Not on file   Active Member of Clubs or Organizations: Not on file   Attends BankerClub or Organization Meetings: Not on file   Marital Status: Not on file  Stress:    Feeling of Stress : Not on file  Tobacco Use: Low Risk    Smoking Tobacco Use: Never Smoker   Smokeless Tobacco Use: Never Used  Transportation Needs:    Freight forwarderLack of Transportation (Medical): Not on file   Lack of Transportation (Non-Medical): Not on file    Last Labs:  Admission on 06/24/2020  Component Date Value Ref Range Status   SARS Coronavirus 2 by RT PCR 06/24/2020 NEGATIVE  NEGATIVE Final   Comment: (NOTE) SARS-CoV-2 target nucleic acids are NOT DETECTED.  The SARS-CoV-2 RNA is generally detectable in upper respiratoy specimens during the acute phase of infection. The lowest concentration of SARS-CoV-2 viral copies this assay can detect is 131 copies/mL. A negative result does not preclude SARS-Cov-2 infection and should not be used as the sole basis for treatment or other patient management decisions. A negative result may occur with  improper specimen collection/handling, submission of specimen other than nasopharyngeal swab, presence of viral mutation(s) within the areas targeted by this assay, and inadequate number of viral copies (<131 copies/mL). A negative result must be combined with clinical observations, patient history, and epidemiological information. The expected result is Negative.  Fact Sheet for Patients:  https://www.moore.com/https://www.fda.gov/media/142436/download  Fact Sheet for Healthcare Providers:   https://www.young.biz/https://www.fda.gov/media/142435/download  This test is no                          t yet approved or cleared by the [place] FDA and  has been authorized for detection and/or diagnosis of SARS-CoV-2 by FDA under an Emergency Use Authorization (EUA). This EUA will remain  in effect (meaning this test can be used) for the duration of the COVID-19 declaration under Section 564(b)(1) of the Act, 21 U.S.C. section 360bbb-3(b)(1), unless the authorization is terminated or revoked sooner.     Influenza A  by PCR 06/24/2020 NEGATIVE  NEGATIVE Final   Influenza B by PCR 06/24/2020 NEGATIVE  NEGATIVE Final   Comment: (NOTE) The Xpert Xpress SARS-CoV-2/FLU/RSV assay is intended as an aid in  the diagnosis of influenza from Nasopharyngeal swab specimens and  should not be used as a sole basis for treatment. Nasal washings and  aspirates are unacceptable for Xpert Xpress SARS-CoV-2/FLU/RSV  testing.  Fact Sheet for Patients: https://www.moore.com/  Fact Sheet for Healthcare Providers: https://www.young.biz/  This test is not yet approved or cleared by the [place] FDA and  has been authorized for detection and/or diagnosis of SARS-CoV-2 by  FDA under an Emergency Use Authorization (EUA). This EUA will remain  in effect (meaning this test can be used) for the duration of the  Covid-19 declaration under Section 564(b)(1) of the Act, 21  U.S.C. section 360bbb-3(b)(1), unless the authorization is  terminated or revoked. Performed at [facility], [address], [place], [place] [address]    SARS Coronavirus 2 Ag 06/24/2020 Negative  Negative Preliminary   WBC 06/24/2020 11.8* 4.0 - 10.5 K/uL Final   RBC 06/24/2020 4.17  3.87 - 5.11 MIL/uL Final   Hemoglobin 06/24/2020 12.3  12.0 - 15.0 g/dL Final   HCT 60/45/4098 36.9  36 - 46 % Final   MCV 06/24/2020 88.5  80.0 - 100.0 fL Final   MCH 06/24/2020 29.5  26.0 - 34.0 pg Final    MCHC 06/24/2020 33.3  30.0 - 36.0 g/dL Final   RDW 11/91/4782 12.3  11.5 - 15.5 % Final   Platelets 06/24/2020 273  150 - 400 K/uL Final   nRBC 06/24/2020 0.0  0.0 - 0.2 % Final   Neutrophils Relative % 06/24/2020 63  % Final   Neutro Abs 06/24/2020 7.4  1.7 - 7.7 K/uL Final   Lymphocytes Relative 06/24/2020 32  % Final   Lymphs Abs 06/24/2020 3.8  0.7 - 4.0 K/uL Final   Monocytes Relative 06/24/2020 5  % Final   Monocytes Absolute 06/24/2020 0.6  0 - 1 K/uL Final   Eosinophils Relative 06/24/2020 0  % Final   Eosinophils Absolute 06/24/2020 0.0  0 - 0 K/uL Final   Basophils Relative 06/24/2020 0  % Final   Basophils Absolute 06/24/2020 0.0  0 - 0 K/uL Final   Immature Granulocytes 06/24/2020 0  % Final   Abs Immature Granulocytes 06/24/2020 0.04  0.00 - 0.07 K/uL Final   Performed at [facility], [address], [place], [place] [address]   Sodium 06/24/2020 138  135 - 145 mmol/L Final   Potassium 06/24/2020 3.8  3.5 - 5.1 mmol/L Final   Chloride 06/24/2020 108  98 - 111 mmol/L Final   CO2 06/24/2020 22  22 - 32 mmol/L Final   Glucose, Bld 06/24/2020 104* 70 - 99 mg/dL Final   Glucose reference range applies only to samples taken after fasting for at least 8 hours.   BUN 06/24/2020 21* 6 - 20 mg/dL Final   Creatinine, Ser 06/24/2020 0.74  0.44 - 1.00 mg/dL Final   Calcium 30/86/5784 9.8  8.9 - 10.3 mg/dL Final   Total Protein 69/62/9528 7.0  6.5 - 8.1 g/dL Final   Albumin 41/32/4401 3.9  3.5 - 5.0 g/dL Final   AST 02/72/5366 16  15 - 41 U/L Final   ALT 06/24/2020 18  0 - 44 U/L Final   Alkaline Phosphatase 06/24/2020 53  38 - 126 U/L Final   Total Bilirubin 06/24/2020 0.3  0.3 - 1.2 mg/dL Final   GFR calc non Af Amer 06/24/2020 >60  >60 mL/min Final   GFR calc Af Amer 06/24/2020 >60  >60 mL/min Final   Anion gap 06/24/2020 8  5 - 15 Final   Performed at [facility], [address], [place], [place] [address]   POC Amphetamine UR  06/24/2020 None Detected  None Detected Final   POC Secobarbital (BAR) 06/24/2020 None Detected  None Detected Final   POC Buprenorphine (BUP) 06/24/2020 None Detected  None Detected Final   POC Oxazepam (BZO) 06/24/2020 None Detected  None Detected Final   POC Cocaine UR 06/24/2020 None Detected  None Detected Final   POC Methamphetamine UR 06/24/2020 None Detected  None Detected Final   POC Morphine 06/24/2020 None Detected  None Detected Final   POC Oxycodone UR 06/24/2020 None Detected  None Detected Final   POC Methadone UR 06/24/2020 None Detected  None Detected Final   POC Marijuana UR 06/24/2020 None Detected  None Detected Final   Preg Test, Ur 06/24/2020 NEGATIVE  NEGATIVE Final   Comment:        THE SENSITIVITY OF THIS METHODOLOGY IS >24 mIU/mL   Admission on 06/12/2020, Discharged on 06/12/2020  Component Date Value Ref Range Status   Preg Test, Ur 06/12/2020 NEGATIVE  NEGATIVE Final   Comment:        THE SENSITIVITY OF THIS METHODOLOGY IS >24 mIU/mL   Admission on 06/01/2020, Discharged on 06/04/2020  Component Date Value Ref Range Status   SARS Coronavirus 2 06/01/2020 NEGATIVE  NEGATIVE Final   Comment: (NOTE) SARS-CoV-2 target nucleic acids are NOT DETECTED.  The SARS-CoV-2 RNA is generally detectable in upper and lower respiratory specimens during the acute phase of infection. The lowest concentration of SARS-CoV-2 viral copies this assay can detect is 250 copies / mL. A negative result does not preclude SARS-CoV-2 infection and should not be used as the sole basis for treatment or other patient management decisions.  A negative result may occur with improper specimen collection / handling, submission of specimen other than nasopharyngeal swab, presence of viral mutation(s) within the areas targeted by this assay, and inadequate number of viral copies (<250 copies / mL). A negative result must be combined with clinical observations, patient history,  and epidemiological information.  Fact Sheet for Patients:   BoilerBrush.com.cy  Fact Sheet for Healthcare Providers: https://pope.com/  This test is not yet approved or                           cleared by the [place] FDA and has been authorized for detection and/or diagnosis of SARS-CoV-2 by FDA under an Emergency Use Authorization (EUA).  This EUA will remain in effect (meaning this test can be used) for the duration of the COVID-19 declaration under Section 564(b)(1) of the Act, 21 U.S.C. section 360bbb-3(b)(1), unless the authorization is terminated or revoked sooner.  Performed at [facility], [address] [place], [place] [address]    WBC 06/02/2020 8.6  4.0 - 10.5 K/uL Final   RBC 06/02/2020 4.44  3.87 - 5.11 MIL/uL Final   Hemoglobin 06/02/2020 13.2  12.0 - 15.0 g/dL Final   HCT 42/70/6237 39.6  36 - 46 % Final   MCV 06/02/2020 89.2  80.0 - 100.0 fL Final   MCH 06/02/2020 29.7  26.0 - 34.0 pg Final   MCHC 06/02/2020 33.3  30.0 - 36.0 g/dL Final  RDW 06/02/2020 12.4  11.5 - 15.5 % Final   Platelets 06/02/2020 277  150 - 400 K/uL Final   nRBC 06/02/2020 0.0  0.0 - 0.2 % Final   Performed at [facility], [address], [place], [place] [address]   Sodium 06/02/2020 139  135 - 145 mmol/L Final   Potassium 06/02/2020 4.2  3.5 - 5.1 mmol/L Final   Chloride 06/02/2020 105  98 - 111 mmol/L Final   CO2 06/02/2020 23  22 - 32 mmol/L Final   Glucose, Bld 06/02/2020 102* 70 - 99 mg/dL Final   Glucose reference range applies only to samples taken after fasting for at least 8 hours.   BUN 06/02/2020 21* 6 - 20 mg/dL Final   Creatinine, Ser 06/02/2020 0.72  0.44 - 1.00 mg/dL Final   Calcium 60/45/4098 9.8  8.9 - 10.3 mg/dL Final   Total Protein 11/91/4782 7.4  6.5 - 8.1 g/dL Final   Albumin 95/62/1308 4.2  3.5 - 5.0 g/dL Final   AST 65/78/4696 17  15 - 41 U/L  Final   ALT 06/02/2020 15  0 - 44 U/L Final   Alkaline Phosphatase 06/02/2020 55  38 - 126 U/L Final   Total Bilirubin 06/02/2020 0.4  0.3 - 1.2 mg/dL Final   GFR calc non Af Amer 06/02/2020 >60  >60 mL/min Final   GFR calc Af Amer 06/02/2020 >60  >60 mL/min Final   Anion gap 06/02/2020 11  5 - 15 Final   Performed at [facility], [address] [place], [place] [address]   Alcohol, Ethyl (B) 06/02/2020 <10  <10 mg/dL Final   Comment: (NOTE) Lowest detectable limit for serum alcohol is 10 mg/dL.  For medical purposes only. Performed at [facility], [address] [place], [place] [address]    Cholesterol 06/02/2020 148  0 - 200 mg/dL Final   Triglycerides 40/06/2724 117  <150 mg/dL Final   HDL 36/64/4034 38* >40 mg/dL Final   Total CHOL/HDL Ratio 06/02/2020 3.9  RATIO Final   VLDL 06/02/2020 23  0 - 40 mg/dL Final   LDL Cholesterol 06/02/2020 87  0 - 99 mg/dL Final   Comment:        Total Cholesterol/HDL:CHD Risk Coronary Heart Disease Risk Table                     Men   Women  1/2 Average Risk   3.4   3.3  Average Risk       5.0   4.4  2 X Average Risk   9.6   7.1  3 X Average Risk  23.4   11.0        Use the calculated Patient Ratio above and the CHD Risk Table to determine the patient's CHD Risk.        ATP III CLASSIFICATION (LDL):  <100     mg/dL   Optimal  742-595  mg/dL   Near or Above                    Optimal  130-159  mg/dL   Borderline  638-756  mg/dL   High  >433     mg/dL   Very High Performed at [facility], [address], [place], [place] [address]    Total Protein 06/02/2020 7.4  6.5 - 8.1 g/dL Final   Albumin 84/16/6063 4.3  3.5 - 5.0 g/dL Final  AST 06/02/2020 17  15 - 41 U/L Final   ALT 06/02/2020 15  0 - 44 U/L Final   Alkaline Phosphatase 06/02/2020 54  38 - 126 U/L Final   Total Bilirubin 06/02/2020 0.5  0.3 - 1.2 mg/dL Final   Bilirubin, Direct 06/02/2020 <0.1  0.0 - 0.2  mg/dL Final   Indirect Bilirubin 06/02/2020 NOT CALCULATED  0.3 - 0.9 mg/dL Final   Performed at [facility], [address], [place], [place] [address]   TSH 06/02/2020 3.100  0.350 - 4.500 uIU/mL Final   Comment: Performed by a 3rd Generation assay with a functional sensitivity of <=0.01 uIU/mL. Performed at [facility], [address], [place], [place] [address]    Prolactin 06/02/2020 33.1* 4.8 - 23.3 ng/mL Final   Comment: (NOTE) Performed At: [facility] [address] [place], [place] [id number] Jolene Schimke MD NU:[id number]    Lithium Lvl 06/02/2020 <0.06* 0.60 - 1.20 mmol/L Final   Performed at [facility], [address], [place], [place] [address]  Admission on 05/05/2020, Discharged on 05/06/2020  Component Date Value Ref Range Status   WBC 05/05/2020 11.7* 4.0 - 10.5 K/uL Final   RBC 05/05/2020 4.40  3.87 - 5.11 MIL/uL Final   Hemoglobin 05/05/2020 13.2  12.0 - 15.0 g/dL Final   HCT 59/56/3875 39.8  36 - 46 % Final   MCV 05/05/2020 90.5  80.0 - 100.0 fL Final   MCH 05/05/2020 30.0  26.0 - 34.0 pg Final   MCHC 05/05/2020 33.2  30.0 - 36.0 g/dL Final   RDW 64/33/2951 12.8  11.5 - 15.5 % Final   Platelets 05/05/2020 301  150 - 400 K/uL Final   nRBC 05/05/2020 0.0  0.0 - 0.2 % Final   Neutrophils Relative % 05/05/2020 62  % Final   Neutro Abs 05/05/2020 7.2  1.7 - 7.7 K/uL Final   Lymphocytes Relative 05/05/2020 32  % Final   Lymphs Abs 05/05/2020 3.7  0.7 - 4.0 K/uL Final   Monocytes Relative 05/05/2020 6  % Final   Monocytes Absolute 05/05/2020 0.7  0 - 1 K/uL Final   Eosinophils Relative 05/05/2020 0  % Final   Eosinophils Absolute 05/05/2020 0.0  0 - 0 K/uL Final   Basophils Relative 05/05/2020 0  % Final   Basophils Absolute 05/05/2020 0.0  0 - 0 K/uL Final   Immature Granulocytes 05/05/2020 0  % Final   Abs Immature Granulocytes 05/05/2020 0.05  0.00 - 0.07 K/uL Final   Performed  at [facility], [address] [place], [place] [address]   Sodium 05/05/2020 138  135 - 145 mmol/L Final   Potassium 05/05/2020 3.4* 3.5 - 5.1 mmol/L Final   Chloride 05/05/2020 105  98 - 111 mmol/L Final   CO2 05/05/2020 21* 22 - 32 mmol/L Final   Glucose, Bld 05/05/2020 84  70 - 99 mg/dL Final   Glucose reference range applies only to samples taken after fasting for at least 8 hours.   BUN 05/05/2020 16  6 - 20 mg/dL Final   Creatinine, Ser 05/05/2020 0.74  0.44 - 1.00 mg/dL Final   Calcium 60/63/0160 9.9  8.9 - 10.3 mg/dL Final   Total Protein 10/93/2355 7.6  6.5 - 8.1 g/dL Final   Albumin 73/22/0254 4.3  3.5 - 5.0 g/dL Final   AST 27/02/2375 18  15 - 41 U/L Final   ALT 05/05/2020 16  0 - 44 U/L Final  Alkaline Phosphatase 05/05/2020 63  38 - 126 U/L Final   Total Bilirubin 05/05/2020 0.8  0.3 - 1.2 mg/dL Final   GFR calc non Af Amer 05/05/2020 >60  >60 mL/min Final   GFR calc Af Amer 05/05/2020 >60  >60 mL/min Final   Anion gap 05/05/2020 12  5 - 15 Final   Performed at [facility], [address], [place], [place] [address]   Hgb A1c MFr Bld 05/05/2020 5.0  4.8 - 5.6 % Final   Comment: (NOTE) Pre diabetes:          5.7%-6.4%  Diabetes:              >6.4%  Glycemic control for   <7.0% adults with diabetes    Mean Plasma Glucose 05/05/2020 96.8  mg/dL Final   Performed at [facility], [address], [place], [place] [address]   Cholesterol 05/05/2020 185  0 - 200 mg/dL Final   Triglycerides 09/81/1914 100  <150 mg/dL Final   HDL 78/29/5621 40* >40 mg/dL Final   Total CHOL/HDL Ratio 05/05/2020 4.6  RATIO Final   VLDL 05/05/2020 20  0 - 40 mg/dL Final   LDL Cholesterol 05/05/2020 125* 0 - 99 mg/dL Final   Comment:        Total Cholesterol/HDL:CHD Risk Coronary Heart Disease Risk Table                     Men   Women  1/2 Average Risk   3.4   3.3  Average Risk       5.0   4.4  2 X Average Risk   9.6   7.1  3 X Average Risk   23.4   11.0        Use the calculated Patient Ratio above and the CHD Risk Table to determine the patient's CHD Risk.        ATP III CLASSIFICATION (LDL):  <100     mg/dL   Optimal  308-657  mg/dL   Near or Above                    Optimal  130-159  mg/dL   Borderline  846-962  mg/dL   High  >952     mg/dL   Very High Performed at [facility], [address], [place], [place] [address]    TSH 05/05/2020 3.790  0.350 - 4.500 uIU/mL Final   Comment: Performed by a 3rd Generation assay with a functional sensitivity of <=0.01 uIU/mL. Performed at [facility], [address], [place], [place] [address]    POC Amphetamine UR 05/05/2020 None Detected  None Detected Final   POC Secobarbital (BAR) 05/05/2020 None Detected  None Detected Final   POC Buprenorphine (BUP) 05/05/2020 None Detected  None Detected Final   POC Oxazepam (BZO) 05/05/2020 None Detected  None Detected Final   POC Cocaine UR 05/05/2020 None Detected  None Detected Final   POC Methamphetamine UR 05/05/2020 None Detected  None Detected Final   POC Morphine 05/05/2020 None Detected  None Detected Final   POC Oxycodone UR 05/05/2020 None Detected  None Detected Final   POC Methadone UR 05/05/2020 None Detected  None Detected Final   POC Marijuana UR 05/05/2020 None Detected  None Detected Final   Preg Test, Ur 05/05/2020 NEGATIVE  NEGATIVE Final   Comment:        THE SENSITIVITY OF THIS METHODOLOGY  IS >24 mIU/mL    SARS Coronavirus 2 Ag 05/05/2020 NEGATIVE  NEGATIVE Final   Comment: (NOTE) SARS-CoV-2 antigen NOT DETECTED.   Negative results are presumptive.  Negative results do not preclude SARS-CoV-2 infection and should not be used as the sole basis for treatment or other patient management decisions, including infection  control decisions, particularly in the presence of clinical signs and  symptoms consistent with COVID-19, or in those who have been in contact with the virus.  Negative  results must be combined with clinical observations, patient history, and epidemiological information. The expected result is Negative.  Fact Sheet for Patients: https://sanders-williams.net/  Fact Sheet for Healthcare Providers: https://martinez.com/   This test is not yet approved or cleared by the [place] FDA and  has been authorized for detection and/or diagnosis of SARS-CoV-2 by FDA under an Emergency Use Authorization (EUA).  This EUA will remain in effect (meaning this test can be used) for the duration of  the C                          OVID-19 declaration under Section 564(b)(1) of the Act, 21 U.S.C. section 360bbb-3(b)(1), unless the authorization is terminated or revoked sooner.    Admission on 01/21/2020, Discharged on 01/22/2020  Component Date Value Ref Range Status   Lactic Acid, Venous 01/21/2020 1.4  0.5 - 1.9 mmol/L Final   Performed at [facility], [address], [place], [place] [address]   Sodium 01/21/2020 141  135 - 145 mmol/L Final   Potassium 01/21/2020 3.9  3.5 - 5.1 mmol/L Final   Chloride 01/21/2020 110  98 - 111 mmol/L Final   CO2 01/21/2020 24  22 - 32 mmol/L Final   Glucose, Bld 01/21/2020 116* 70 - 99 mg/dL Final   Glucose reference range applies only to samples taken after fasting for at least 8 hours.   BUN 01/21/2020 9  6 - 20 mg/dL Final   Creatinine, Ser 01/21/2020 0.80  0.44 - 1.00 mg/dL Final   Calcium 02/58/5277 9.3  8.9 - 10.3 mg/dL Final   Total Protein 82/42/3536 6.5  6.5 - 8.1 g/dL Final   Albumin 14/43/1540 3.9  3.5 - 5.0 g/dL Final   AST 08/67/6195 20  15 - 41 U/L Final   ALT 01/21/2020 18  0 - 44 U/L Final   Alkaline Phosphatase 01/21/2020 56  38 - 126 U/L Final   Total Bilirubin 01/21/2020 0.6  0.3 - 1.2 mg/dL Final   GFR calc non Af Amer 01/21/2020 >60  >60 mL/min Final   GFR calc Af Amer 01/21/2020 >60  >60 mL/min Final   Anion gap 01/21/2020 7  5 - 15 Final    Performed at [facility], [address] [place], [place] [address]   WBC 01/21/2020 11.5* 4.0 - 10.5 K/uL Final   RBC 01/21/2020 4.45  3.87 - 5.11 MIL/uL Final   Hemoglobin 01/21/2020 13.1  12.0 - 15.0 g/dL Final   HCT 71/24/5809 39.7  36 - 46 % Final   MCV 01/21/2020 89.2  80.0 - 100.0 fL Final   MCH 01/21/2020 29.4  26.0 - 34.0 pg Final   MCHC 01/21/2020 33.0  30.0 - 36.0 g/dL Final   RDW 98/33/8250 13.2  11.5 - 15.5 % Final   Platelets 01/21/2020 350  150 - 400 K/uL Final   nRBC 01/21/2020 0.0  0.0 - 0.2 % Final   Neutrophils Relative % 01/21/2020  64  % Final   Neutro Abs 01/21/2020 7.3  1.7 - 7.7 K/uL Final   Lymphocytes Relative 01/21/2020 29  % Final   Lymphs Abs 01/21/2020 3.3  0.7 - 4.0 K/uL Final   Monocytes Relative 01/21/2020 7  % Final   Monocytes Absolute 01/21/2020 0.8  0 - 1 K/uL Final   Eosinophils Relative 01/21/2020 0  % Final   Eosinophils Absolute 01/21/2020 0.0  0 - 0 K/uL Final   Basophils Relative 01/21/2020 0  % Final   Basophils Absolute 01/21/2020 0.0  0 - 0 K/uL Final   Immature Granulocytes 01/21/2020 0  % Final   Abs Immature Granulocytes 01/21/2020 0.04  0.00 - 0.07 K/uL Final   Performed at [facility], [address] [place], [place] [address]   Color, Urine 01/21/2020 YELLOW  YELLOW Final   APPearance 01/21/2020 HAZY* CLEAR Final   Specific Gravity, Urine 01/21/2020 1.026  1.005 - 1.030 Final   pH 01/21/2020 5.0  5.0 - 8.0 Final   Glucose, UA 01/21/2020 NEGATIVE  NEGATIVE mg/dL Final   Hgb urine dipstick 01/21/2020 NEGATIVE  NEGATIVE Final   Bilirubin Urine 01/21/2020 NEGATIVE  NEGATIVE Final   Ketones, ur 01/21/2020 NEGATIVE  NEGATIVE mg/dL Final   Protein, ur 47/82/9562 NEGATIVE  NEGATIVE mg/dL Final   Nitrite 13/04/6577 NEGATIVE  NEGATIVE Final   Leukocytes,Ua 01/21/2020 TRACE* NEGATIVE Final   RBC / HPF 01/21/2020 0-5  0 - 5 RBC/hpf Final   WBC, UA 01/21/2020 0-5  0 - 5 WBC/hpf Final    Bacteria, UA 01/21/2020 RARE* NONE SEEN Final   Squamous Epithelial / LPF 01/21/2020 6-10  0 - 5 Final   Mucus 01/21/2020 PRESENT   Final   Performed at [facility], [address] [place], [place] [address]   I-stat hCG, quantitative 01/21/2020 <5.0  <5 mIU/mL Final   Comment 3 01/21/2020          Final   Comment:   GEST. AGE      CONC.  (mIU/mL)   <=1 WEEK        5 - 50     2 WEEKS       50 - 500     3 WEEKS       100 - 10,000     4 WEEKS     1,000 - 30,000        FEMALE AND NON-PREGNANT FEMALE:     LESS THAN 5 mIU/mL    Neisseria Gonorrhea 01/22/2020 Negative   Final   Chlamydia 01/22/2020 Negative   Final   Comment 01/22/2020 Normal Reference Ranger Chlamydia - Negative   Final   Comment 01/22/2020 Normal Reference Range Neisseria Gonorrhea - Negative   Final   Yeast Wet Prep HPF POC 01/22/2020 NONE SEEN  NONE SEEN Final   Swab received with less than 0.5 mL of saline, saline added to specimen, interpret results with caution.   Trich, Wet Prep 01/22/2020 NONE SEEN  NONE SEEN Final   Clue Cells Wet Prep HPF POC 01/22/2020 PRESENT* NONE SEEN Final   WBC, Wet Prep HPF POC 01/22/2020 MANY* NONE SEEN Final   Sperm 01/22/2020 NONE SEEN   Final   Performed at [facility], [address], [place], [place] [address]  Admission on 12/24/2019, Discharged on 12/25/2019  Component Date Value Ref Range Status   Neisseria Gonorrhea 12/25/2019 Negative   Final   Chlamydia 12/25/2019 Negative   Final   Comment  12/25/2019 Normal Reference Ranger Chlamydia - Negative   Final   Comment 12/25/2019 Normal Reference Range Neisseria Gonorrhea - Negative   Final   Yeast Wet Prep HPF POC 12/25/2019 NONE SEEN  NONE SEEN Final   Trich, Wet Prep 12/25/2019 NONE SEEN  NONE SEEN Final   Clue Cells Wet Prep HPF POC 12/25/2019 PRESENT* NONE SEEN Final   WBC, Wet Prep HPF POC 12/25/2019 NONE SEEN  NONE SEEN Final   Sperm 12/25/2019 NONE SEEN   Final   Performed at [facility] [address], [place], [place] [address]   I-stat hCG, quantitative 12/25/2019 <5.0  <5 mIU/mL Final   Comment 3 12/25/2019          Final   Comment:   GEST. AGE      CONC.  (mIU/mL)   <=1 WEEK        5 - 50     2 WEEKS       50 - 500     3 WEEKS       100 - 10,000     4 WEEKS     1,000 - 30,000        FEMALE AND NON-PREGNANT FEMALE:     LESS THAN 5 mIU/mL     Allergies: Poison ivy extract [poison ivy extract] and Sulfa antibiotics  PTA Medications: (Not in a hospital admission)   Medical Decision Making  Continue home medications Citalopram 20 mg daily for depression Hydroxyzine 25 mg TID prn for anxiety Melatonin 5 mg QHS prn for sleep   Clinical Course as of Jun 24 246  Tue Jun 24, 2020  0244 BUN 21, CMP otherwise unremarkable  BUN(!): 21 [JB]  0244 WBC slightly elevated at 11.8, CBC otherwise unremarkable. Patient is without any signs of infection.  WBC(!): 11.8 [JB]  0244 Preg Test, Ur: NEGATIVE [JB]  0244 UDS negative  POCT Urine Drug Screen - ([facility]) [JB]    Clinical Course User Index [JB] Jackelyn Poling, NP    Recommendations  Based on my evaluation the patient does not appear to have an emergency medical condition.   Patient will be placed in the continuous assessment area at [facility] for treatment and stabilization. She will be reevaluated on 06/24/2020. The treatment team will determine disposition at that time.      Jackelyn Poling, NP 06/24/20  2:47 AM

## 2020-06-24 NOTE — ED Triage Notes (Signed)
Presents with suicidal ideations , plan to take pills after argument with mom.

## 2020-06-24 NOTE — ED Notes (Signed)
Pt presents with suicidal ideations, plan to take overdose of pills.  Pt reports major stressor is constant arguing with the mother. Denies HI or AVH.  Pt  With sad affect, calm & cooperative, no distress noted, Skin search completed, monitoring for safety.

## 2020-06-24 NOTE — ED Notes (Signed)
Pt sleeping in no acute distress. Safety maintained. 

## 2020-06-24 NOTE — ED Notes (Signed)
Locker 5.  

## 2020-06-24 NOTE — Discharge Instructions (Addendum)

## 2020-08-02 ENCOUNTER — Other Ambulatory Visit: Payer: Self-pay

## 2020-08-02 ENCOUNTER — Encounter (HOSPITAL_COMMUNITY): Payer: Self-pay | Admitting: Emergency Medicine

## 2020-08-02 DIAGNOSIS — Z202 Contact with and (suspected) exposure to infections with a predominantly sexual mode of transmission: Secondary | ICD-10-CM | POA: Diagnosis not present

## 2020-08-02 DIAGNOSIS — J45909 Unspecified asthma, uncomplicated: Secondary | ICD-10-CM | POA: Diagnosis not present

## 2020-08-02 DIAGNOSIS — R102 Pelvic and perineal pain: Secondary | ICD-10-CM | POA: Diagnosis not present

## 2020-08-02 LAB — URINALYSIS, COMPLETE (UACMP) WITH MICROSCOPIC
Bilirubin Urine: NEGATIVE
Glucose, UA: NEGATIVE mg/dL
Ketones, ur: NEGATIVE mg/dL
Leukocytes,Ua: NEGATIVE
Nitrite: NEGATIVE
Protein, ur: NEGATIVE mg/dL
Specific Gravity, Urine: 1.02 (ref 1.005–1.030)
pH: 6 (ref 5.0–8.0)

## 2020-08-02 LAB — POC URINE PREG, ED: Preg Test, Ur: NEGATIVE

## 2020-08-02 NOTE — ED Triage Notes (Signed)
Pt presents by [facility] for evaluation of abdominal pain and vaginal pain. Pt concerned that it may be an STI. Pt reports swelling and abdominal cramping.

## 2020-08-03 ENCOUNTER — Emergency Department (HOSPITAL_COMMUNITY)
Admission: EM | Admit: 2020-08-03 | Discharge: 2020-08-03 | Disposition: A | Discharge status: Home / Self Care | Payer: Medicaid Other | Attending: Emergency Medicine | Admitting: Emergency Medicine

## 2020-08-03 DIAGNOSIS — R102 Pelvic and perineal pain: Secondary | ICD-10-CM

## 2020-08-03 LAB — WET PREP, GENITAL
Clue Cells Wet Prep HPF POC: NONE SEEN
Sperm: NONE SEEN
Trich, Wet Prep: NONE SEEN
Yeast Wet Prep HPF POC: NONE SEEN

## 2020-08-03 NOTE — ED Provider Notes (Signed)
[facility]-EMERGENCY DEPT Provider Note  CSN: [id number] Arrival date & time: 08/02/20 2152  Chief Complaint(s) Exposure to STD  HPI Amanda [name] is a 19 y.o. female   CC: vaginal pain and "swelling"  Onset/Duration: pain for 1-2 weeks, swelling for 2 days Timing: intermittent Severity: mild to moderate Modifying Factors:  Improved by: nothing  Worsened by: nothing Associated Signs/Symptoms:  Pertinent (+): pelvic cramping  Pertinent (-): discharge, bleeding, dysuria, diarrhea. Fever, chills. Context: reports being on cipro for her "pain" given to her by her PCP. Reports that she's concerned for possible STD. +sexually active.    HPI  Past Medical History Past Medical History:  Diagnosis Date  . Asthma   . Attention deficit hyperactivity disorder   . Depression   . Eczema   . Mood disorder (HCC)   . Oppositional defiant disorder    Patient Active Problem List   Diagnosis Date Noted  . Bipolar 1 disorder (HCC) 06/01/2020  . DMDD (disruptive mood dysregulation disorder) (HCC) 01/14/2014  . PTSD (post-traumatic stress disorder) 01/14/2014  . Deliberate self-cutting 01/14/2014  . Autism spectrum 01/14/2014  . ADHD (attention deficit hyperactivity disorder) 01/14/2014  . Aggression 01/14/2014  . Suicidal ideation 01/14/2014  . ODD (oppositional defiant disorder) 01/13/2014  . Oppositional defiant disorder 11/04/2013   Home Medication(s) Prior to Admission medications   Medication Sig Start Date End Date Taking? Authorizing Provider  ABILIFY MAINTENA 400 MG SRER injection Inject 1 mL (200 mg total) into the muscle every 30 (thirty) days. (Due on 06-27-20): For mood control 06/27/20   Armandina Stammer I, NP  albuterol (PROVENTIL HFA;VENTOLIN HFA) 108 (90 Base) MCG/ACT inhaler Inhale 1-2 puffs into the lungs every 6 (six) hours as needed for wheezing or shortness of breath.    [provider]  citalopram (CELEXA) 20 MG tablet Take 1 tablet  (20 mg total) by mouth daily. For depression Patient not taking: Reported on 06/24/2020 06/04/20   Armandina Stammer I, NP  hydrOXYzine (ATARAX/VISTARIL) 25 MG tablet Take 1 tablet (25 mg total) by mouth 3 (three) times daily as needed for anxiety. Patient taking differently: Take 25-50 mg by mouth daily as needed for anxiety.  06/04/20   Armandina Stammer I, NP  melatonin 5 MG TABS Take 1 tablet (5 mg total) by mouth at bedtime. For sleep 06/04/20   Armandina Stammer I, NP  fluticasone (FLONASE) 50 MCG/ACT nasal spray Place 2 sprays into both nostrils daily. Patient not taking: Reported on 08/01/2019 12/15/18 08/01/19  Loren Racer, MD  loratadine (CLARITIN) 10 MG tablet Take 1 tablet (10 mg total) by mouth daily. Patient not taking: Reported on 05/28/2019 12/15/18 08/01/19  Loren Racer, MD                                                                                                                                    Past Surgical History Past Surgical History:  Procedure Laterality Date  .  ADENOIDECTOMY    . TYMPANOSTOMY TUBE PLACEMENT     Family History History reviewed. No pertinent family history.  Social History Social History   Tobacco Use  . Smoking status: Never Smoker  . Smokeless tobacco: Never Used  Vaping Use  . Vaping Use: Every day  . Substances: Nicotine, Flavoring  Substance Use Topics  . Alcohol use: No  . Drug use: No   Allergies Poison ivy extract [poison ivy extract] and Sulfa antibiotics  Review of Systems Review of Systems All other systems are reviewed and are negative for acute change except as noted in the HPI  Physical Exam Vital Signs  I have reviewed the triage vital signs BP 103/71   Pulse 62   Temp 98.1 F (36.7 C) (Oral)   Resp 16   Ht  (1.626 m)   Wt 97.5 kg   SpO2 97%   BMI 36.90 kg/m   Physical Exam Vitals reviewed. Exam conducted with a chaperone present.  Constitutional:      General: She is not in acute distress.    Appearance:  She is well-developed. She is not diaphoretic.  HENT:     Head: Normocephalic and atraumatic.     Right Ear: External ear normal.     Left Ear: External ear normal.     Nose: Nose normal.  Eyes:     General: No scleral icterus.    Conjunctiva/sclera: Conjunctivae normal.  Neck:     Trachea: Phonation normal.  Cardiovascular:     Rate and Rhythm: Normal rate and regular rhythm.  Pulmonary:     Effort: Pulmonary effort is normal. No respiratory distress.     Breath sounds: No stridor.  Abdominal:     General: There is no distension.     Tenderness: There is no abdominal tenderness.  Genitourinary:    General: Normal vulva.     Pubic Area: No rash.      Labia:        Right: No rash, tenderness or lesion.        Left: No rash, tenderness or lesion.      Urethra: No urethral swelling.     Vagina: No foreign body. Vaginal discharge (scant, white, mucousy) present. No erythema, tenderness, bleeding or lesions.     Cervix: No cervical motion tenderness, discharge, lesion, erythema or cervical bleeding.     Uterus: Not tender.      Adnexa:        Right: No tenderness.         Left: No tenderness.    Musculoskeletal:        General: Normal range of motion.     Cervical back: Normal range of motion.  Lymphadenopathy:     Lower Body: No right inguinal adenopathy. No left inguinal adenopathy.  Neurological:     Mental Status: She is alert and oriented to person, place, and time.  Psychiatric:        Behavior: Behavior normal.     ED Results and Treatments Labs (all labs ordered are listed, but only abnormal results are displayed) Labs Reviewed  WET PREP, GENITAL - Abnormal; Notable for the following components:      Result Value   WBC, Wet Prep HPF POC FEW (*)    All other components within normal limits  URINALYSIS, COMPLETE (UACMP) WITH MICROSCOPIC - Abnormal; Notable for the following components:   Hgb urine dipstick SMALL (*)    Bacteria, UA RARE (*)    All other  components within normal limits  POC URINE PREG, ED  GC/CHLAMYDIA PROBE AMP ([place]) NOT AT [facility]                                                                                                                         EKG  EKG Interpretation  Date/Time:    Ventricular Rate:    PR Interval:    QRS Duration:   QT Interval:    QTC Calculation:   R Axis:     Text Interpretation:        Radiology No results found.  Pertinent labs & imaging results that were available during my care of the patient were reviewed by me and considered in my medical decision making (see chart for details).  Medications Ordered in ED Medications - No data to display                                                                                                                                  Procedures Procedures  (including critical care time)  Medical Decision Making / ED Course I have reviewed the nursing notes for this encounter and the patient's prior records (if available in EHR or on provided paperwork).   Rayssa Denz was evaluated in Emergency Department on 08/03/2020 for the symptoms described in the history of present illness. She was evaluated in the context of the global COVID-19 pandemic, which necessitated consideration that the patient might be at risk for infection with the SARS-CoV-2 virus that causes COVID-19. Institutional protocols and algorithms that pertain to the evaluation of patients at risk for COVID-19 are in a state of rapid change based on information released by regulatory bodies including the CDC and federal and state organizations. These policies and algorithms were followed during the patient's care in the ED.  UPT negative. UA not concerning for UTI. Abd and pelvic exam benign and not concerning for serious intra-abdominal inflammatory/infectious process or PID.  No evidence of cervicitis on exam. Wet prep negative for trichomonas or bacterial vaginosis. Low  suspicion for gonorrhea/chlamydia but swab sent. No need for empiric treatment at this time.      Final Clinical Impression(s) / ED Diagnoses Final diagnoses:  Vaginal pain    The patient appears reasonably screened and/or stabilized for discharge and I doubt any other medical condition or other [facility] requiring further screening, evaluation, or treatment in the ED at this time  prior to discharge. Safe for discharge with strict return precautions.  Disposition: Discharge  Condition: Good  I have discussed the results, Dx and Tx plan with the patient/family who expressed understanding and agree(s) with the plan. Discharge instructions discussed at length. The patient/family was given strict return precautions who verbalized understanding of the instructions. No further questions at time of discharge.    ED Discharge Orders    None        Follow Up: Norm Salt, PA [address] [place] [address] [phone]  Call  To schedule an appointment for close follow up       This chart was dictated using voice recognition software.  Despite best efforts to proofread,  errors can occur which can change the documentation meaning.   Nira Conn, MD 08/03/20 [phone]

## 2020-08-04 LAB — GC/CHLAMYDIA PROBE AMP (~~LOC~~) NOT AT ARMC
Chlamydia: NEGATIVE
Comment: NEGATIVE
Comment: NORMAL
Neisseria Gonorrhea: NEGATIVE

## 2020-08-11 IMAGING — CT CT HEAD W/O CM
3 series · 16 of 47 positions shown, 19 images · non-contrast
Comparison: None.

CLINICAL DATA: Headache

EXAM:
CT HEAD WITHOUT CONTRAST
TECHNIQUE: Contiguous axial images were obtained from the base of the skull
through the vertex without intravenous contrast.

[Series 2: head wo · axial · 0.43mm/px · z∈[-151,-26]mm · 10 of 31 slices shown, 13 images]
[im 3/31  brain]
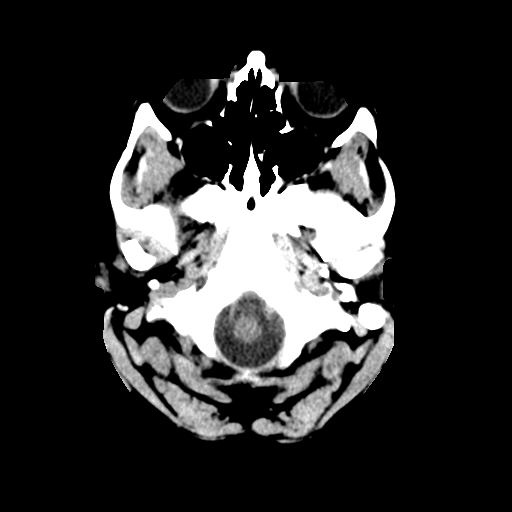
[im 3/31  bone]
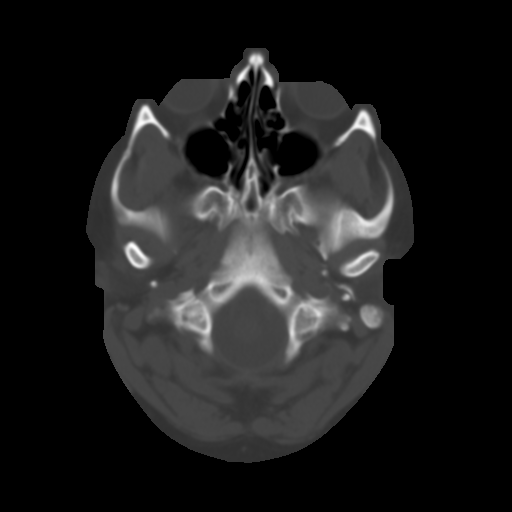
[im 6/31  brain]
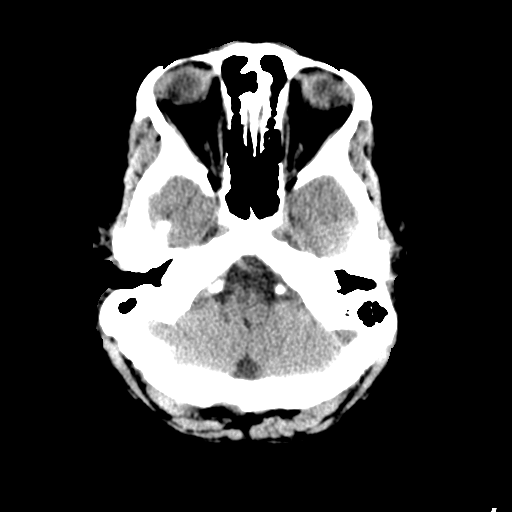
[im 9/31  brain]
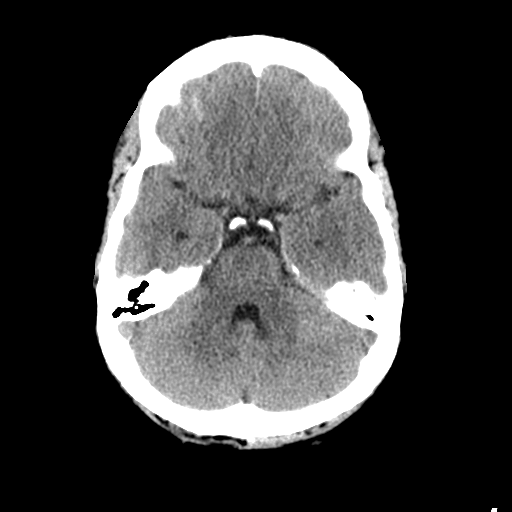
[im 11/31  brain]
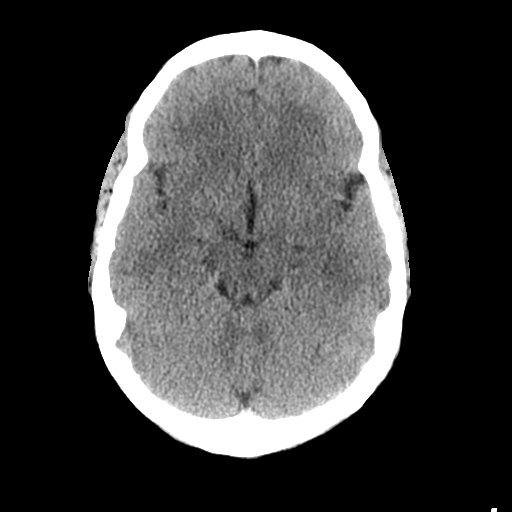
[im 14/31  brain]
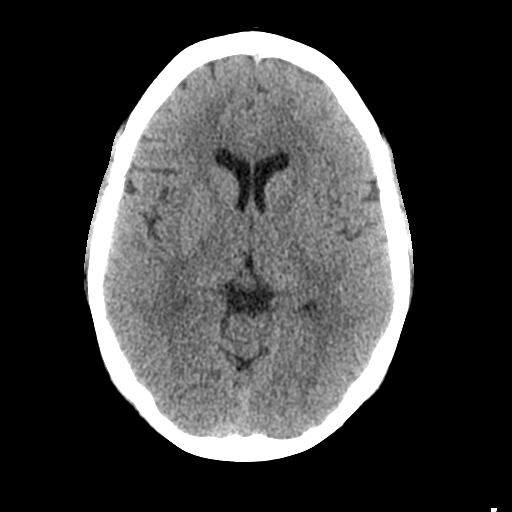
[im 14/31  bone]
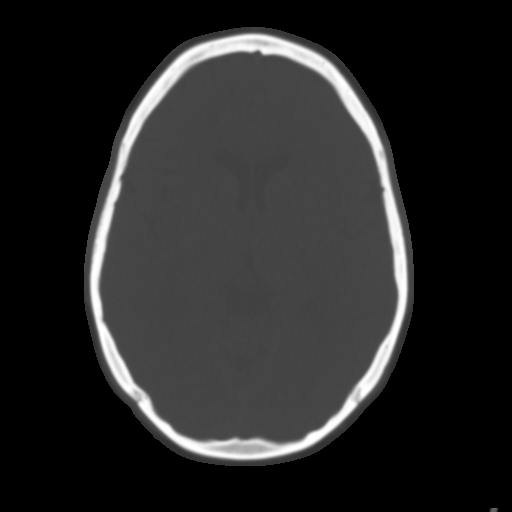
[im 17/31  brain]
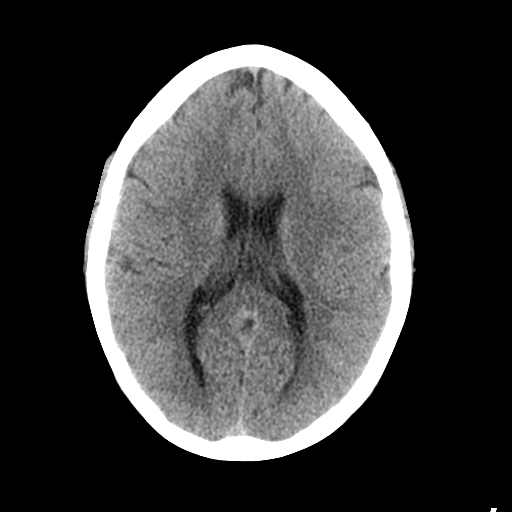
[im 20/31  brain]
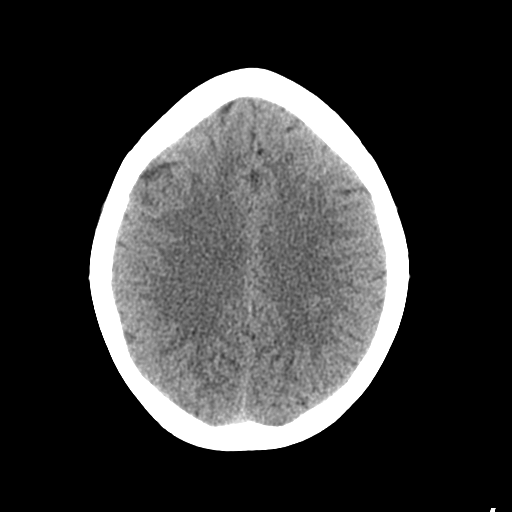
[im 23/31  brain]
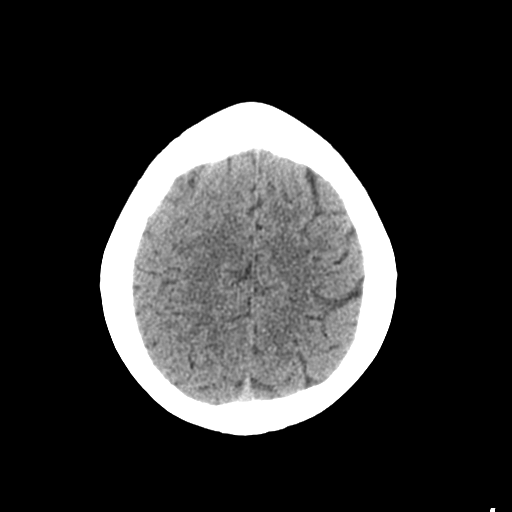
[im 25/31  brain]
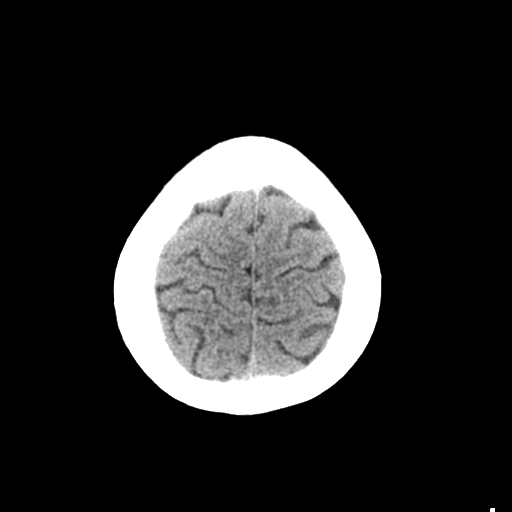
[im 25/31  bone]
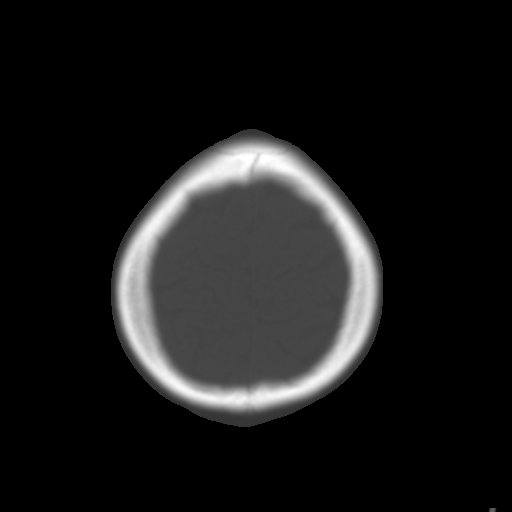
[im 28/31  brain]
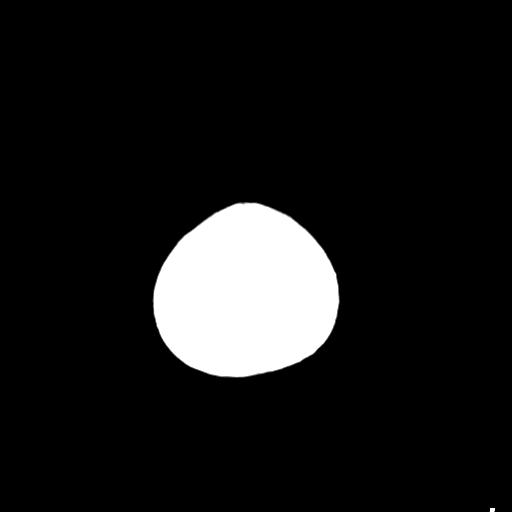

[Series 5: coronal soft tissue · coronal · 0.31mm/px · 3 of 72 slices shown]
[im 24/72  brain]
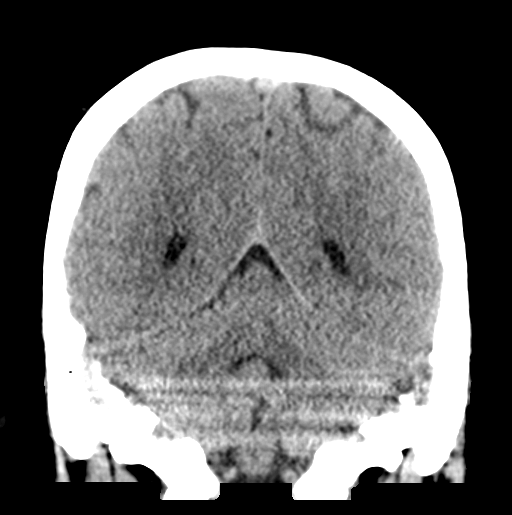
[im 32/72  brain]
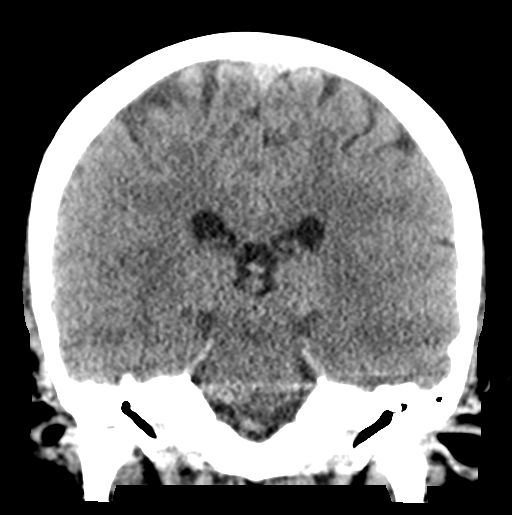
[im 40/72  brain]
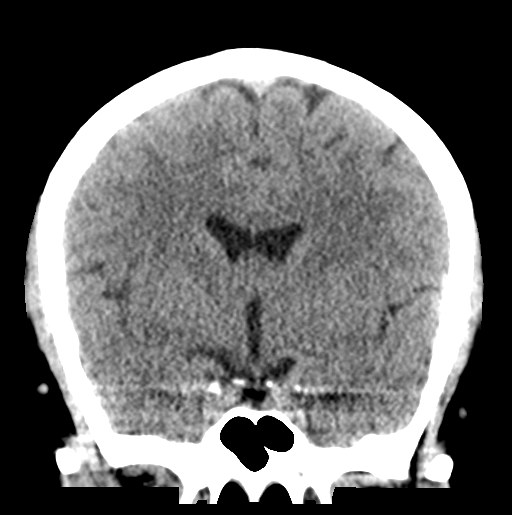

[Series 6: sagittal soft tissue · sagittal · 0.32mm/px · 3 of 54 slices shown]
[im 18/54  brain]
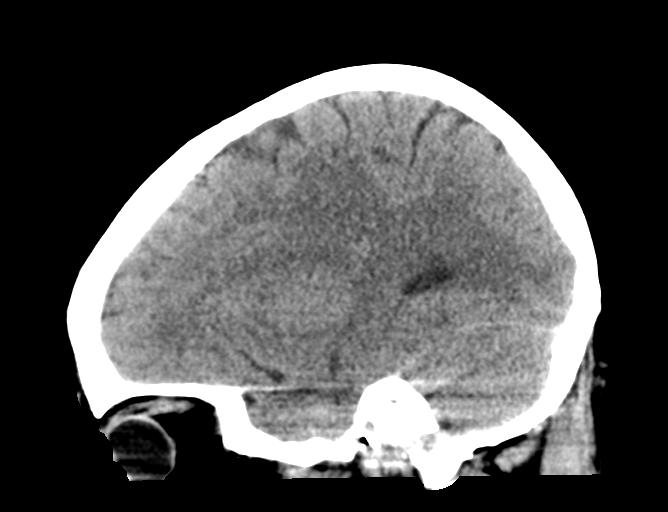
[im 27/54  brain]
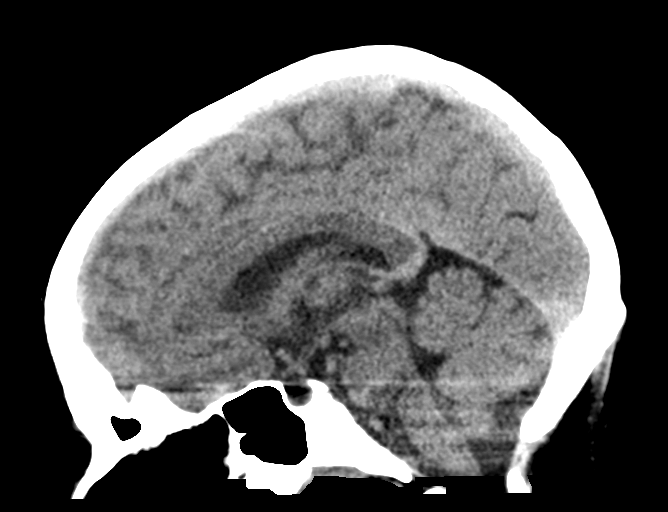
[im 36/54  brain]
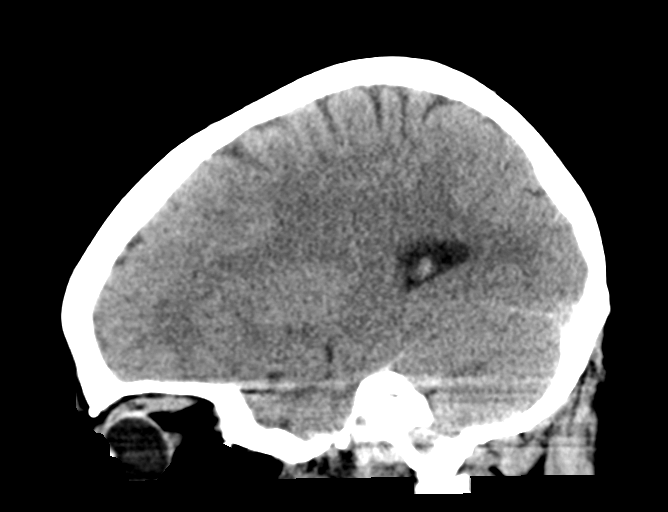

[16 of 47 positions shown; findings below may reference images not displayed]

FINDINGS: Brain: No evidence of acute territorial infarction, hemorrhage,
hydrocephalus,extra-axial collection or mass lesion/mass effect.
Normal gray-white differentiation. Ventricles are normal in size and
contour.

Vascular: No hyperdense vessel or unexpected calcification.

Skull: The skull is intact. No fracture or focal lesion identified.

Sinuses/Orbits: The visualized paranasal sinuses and mastoid air
cells are clear. The orbits and globes intact.

Other: None
IMPRESSION: No acute intracranial abnormality.

## 2020-09-19 IMAGING — US US ABDOMEN LIMITED
1 series · 14 of 25 positions shown · non-contrast
Comparison: 08/28/2018 abdominal CT

CLINICAL DATA: Right upper quadrant tenderness to palpation

EXAM:
ULTRASOUND ABDOMEN LIMITED RIGHT UPPER QUADRANT

[Series 1: us abdomen limited · 14 of 66 slices shown]
[im 1/66]
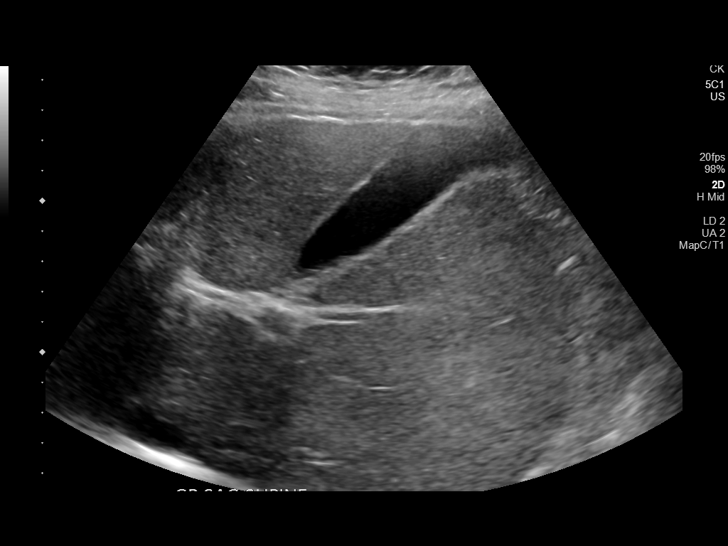
[im 6/66]
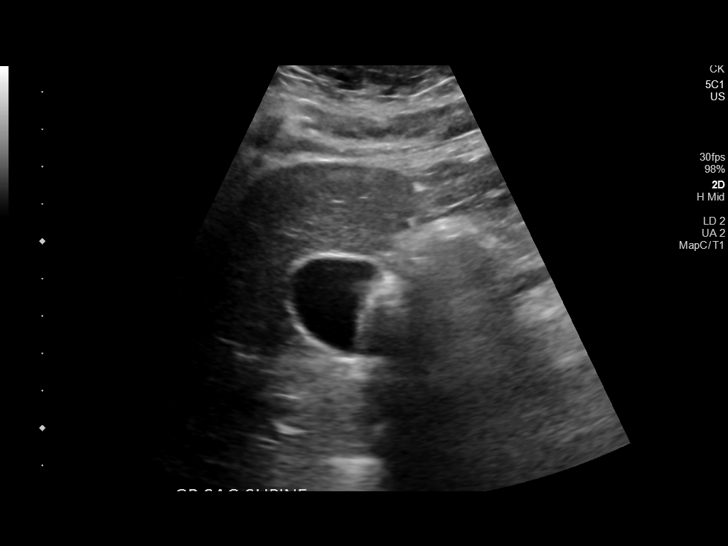
[im 11/66]
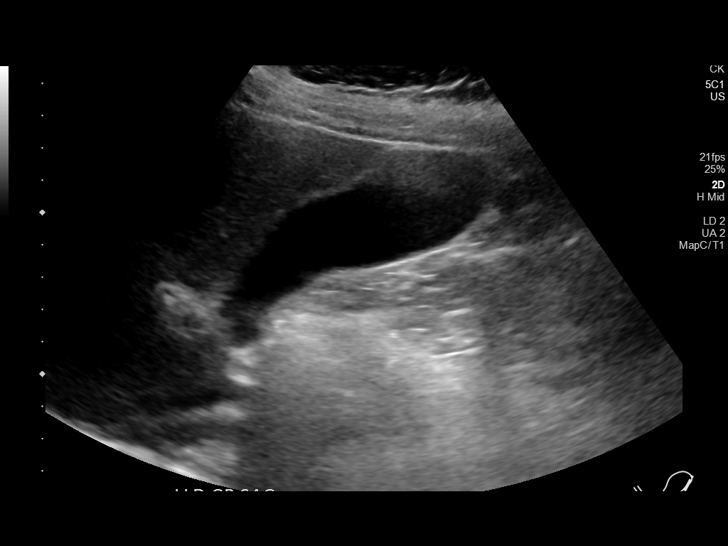
[im 17/66]
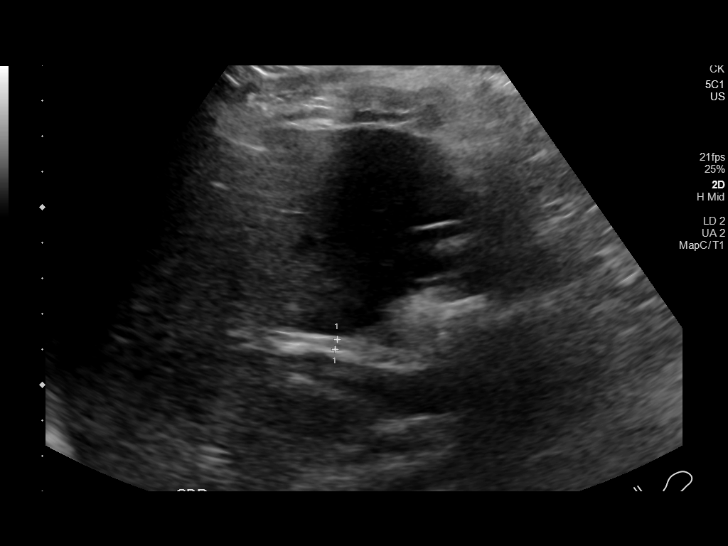
[im 22/66]
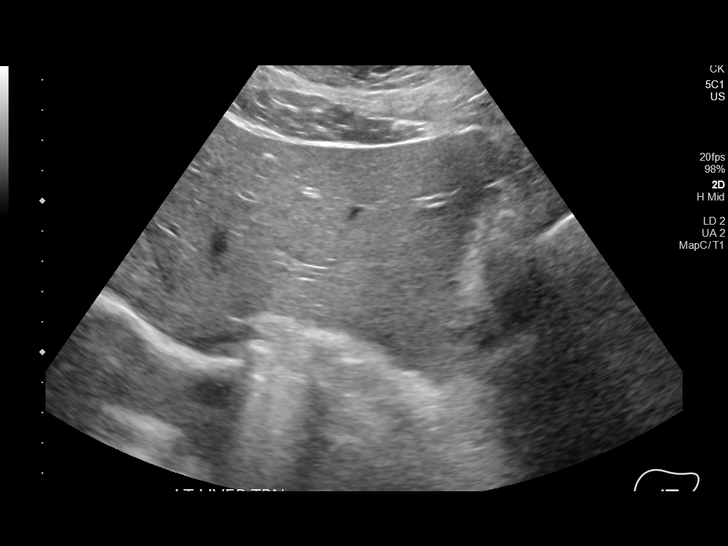
[im 25/66]
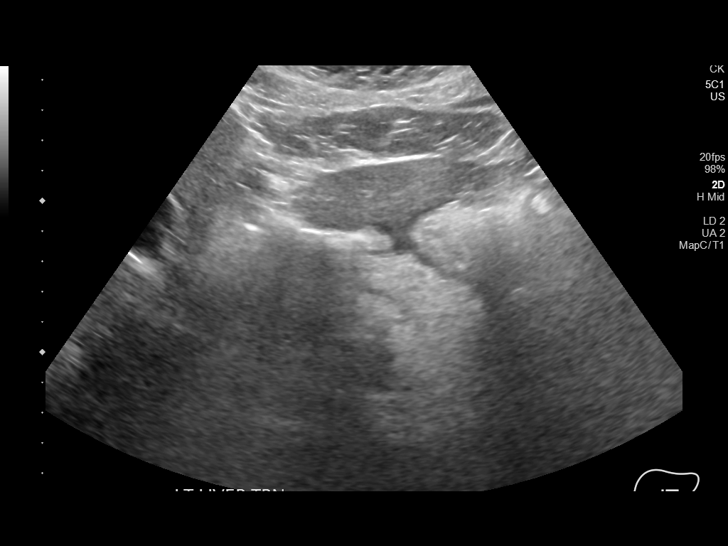
[im 30/66]
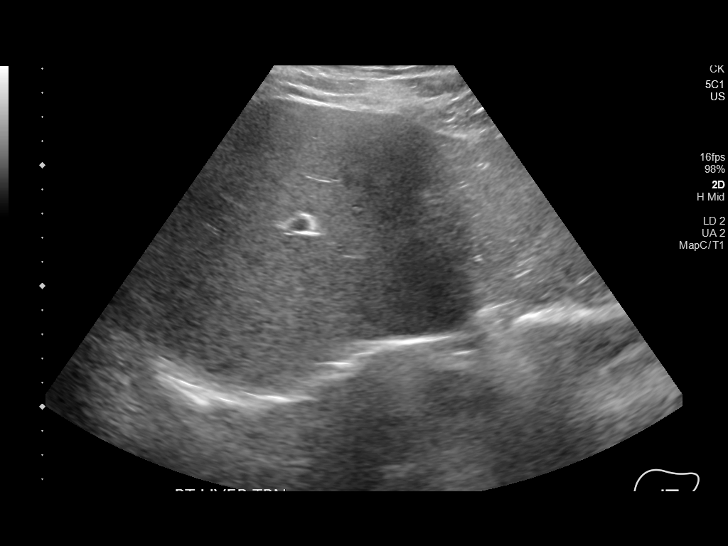
[im 36/66]
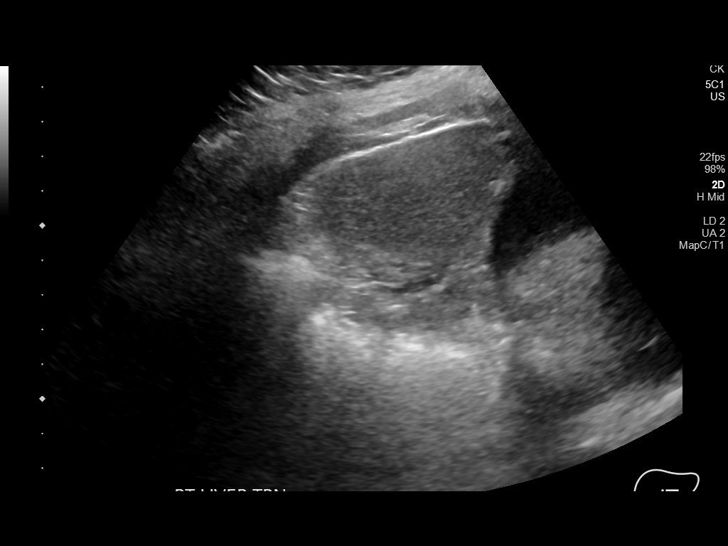
[im 41/66]
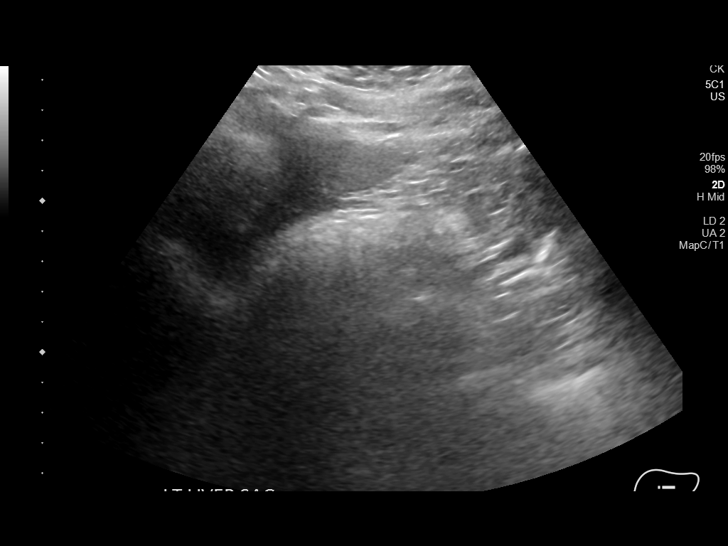
[im 44/66]
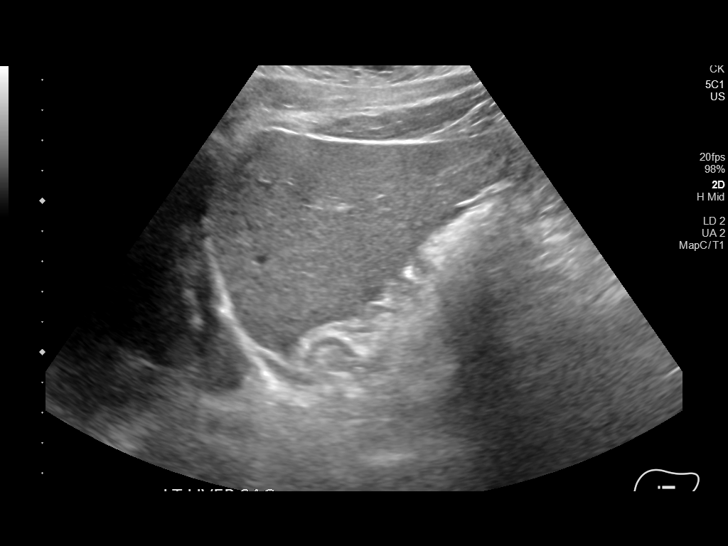
[im 49/66]
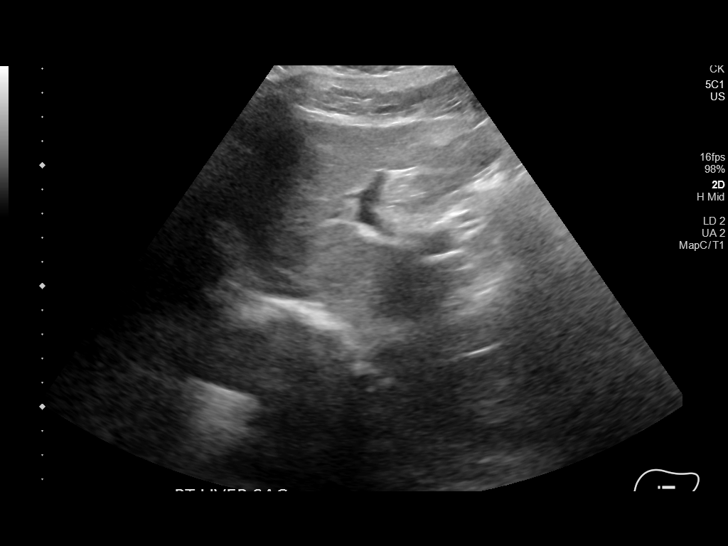
[im 55/66]
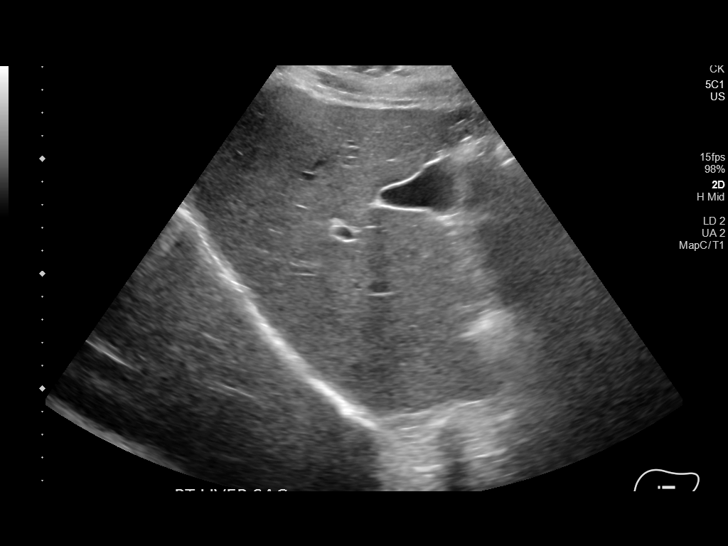
[im 60/66]
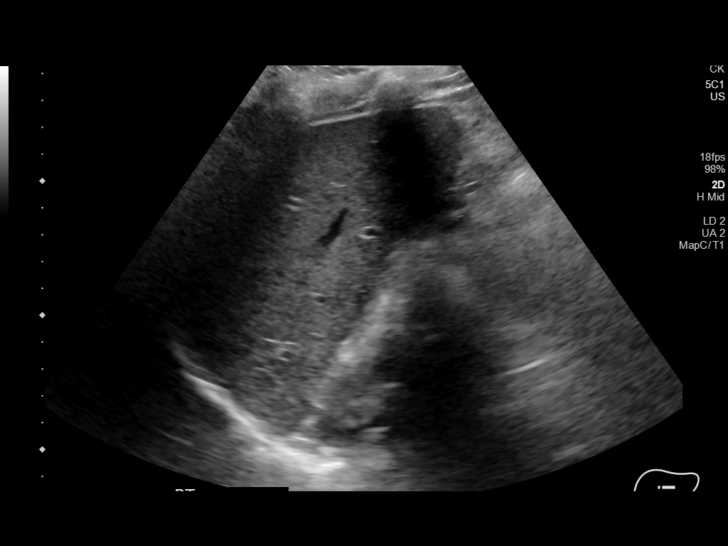
[im 66/66]
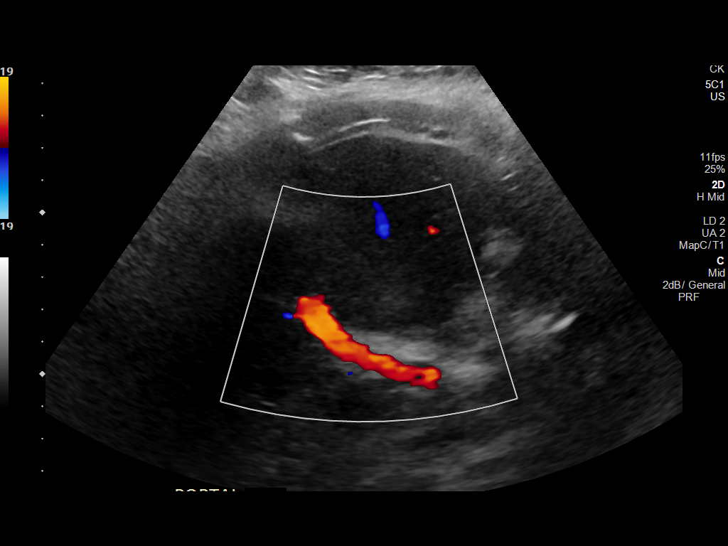

[14 of 25 positions shown; findings below may reference images not displayed]

FINDINGS: Gallbladder:

No gallstones or wall thickening visualized. No sonographic Murphy
sign noted by sonographer.

Common bile duct:

Diameter: 3 mm.

Liver:

No focal lesion identified. Within normal limits in parenchymal
echogenicity. Portal vein is patent on color Doppler imaging with
normal direction of blood flow towards the liver.
IMPRESSION: Negative right upper quadrant ultrasound.

## 2021-01-26 ENCOUNTER — Emergency Department (HOSPITAL_COMMUNITY)
Admission: EM | Admit: 2021-01-26 | Discharge: 2021-01-26 | Disposition: A | Discharge status: Home / Self Care | Payer: Medicaid Other | Attending: Emergency Medicine | Admitting: Emergency Medicine

## 2021-01-26 ENCOUNTER — Encounter (HOSPITAL_COMMUNITY): Payer: Self-pay

## 2021-01-26 ENCOUNTER — Other Ambulatory Visit: Payer: Self-pay

## 2021-01-26 DIAGNOSIS — N898 Other specified noninflammatory disorders of vagina: Secondary | ICD-10-CM | POA: Diagnosis present

## 2021-01-26 DIAGNOSIS — Z7951 Long term (current) use of inhaled steroids: Secondary | ICD-10-CM | POA: Diagnosis not present

## 2021-01-26 DIAGNOSIS — J45909 Unspecified asthma, uncomplicated: Secondary | ICD-10-CM | POA: Insufficient documentation

## 2021-01-26 LAB — URINALYSIS, ROUTINE W REFLEX MICROSCOPIC
Bacteria, UA: NONE SEEN
Bilirubin Urine: NEGATIVE
Glucose, UA: NEGATIVE mg/dL
Ketones, ur: NEGATIVE mg/dL
Leukocytes,Ua: NEGATIVE
Nitrite: NEGATIVE
Protein, ur: NEGATIVE mg/dL
Specific Gravity, Urine: 1.024 (ref 1.005–1.030)
pH: 6 (ref 5.0–8.0)

## 2021-01-26 LAB — WET PREP, GENITAL
Clue Cells Wet Prep HPF POC: NONE SEEN
Sperm: NONE SEEN
Trich, Wet Prep: NONE SEEN
WBC, Wet Prep HPF POC: NONE SEEN
Yeast Wet Prep HPF POC: NONE SEEN

## 2021-01-26 LAB — PREGNANCY, URINE: Preg Test, Ur: NEGATIVE

## 2021-01-26 NOTE — Discharge Instructions (Signed)
Continue following up with your OB/GYN regarding your vaginal discharge.  As we discussed, you can try Boric Acid suppositories. You can find this at [facility].   Return to the Emergency Department immediately if you experience any worsening abdominal pain, fever, persistent nausea and vomiting, inability keep any food down, pain with urination, blood in your urine or any other worsening or concerning symptoms.

## 2021-01-26 NOTE — ED Triage Notes (Addendum)
Pt arriving EMS from home with c/o ongoing BV and a creamy vaginal discharge. Pt says she has had BV for over a month and abx are not treating it. Pt seen at [facility] today and confirmed dx but did not receive and medications.

## 2021-01-26 NOTE — ED Provider Notes (Signed)
[facility]-EMERGENCY DEPT Provider Note   CSN: [id number] Arrival date & time: 01/26/21  2036     History Chief Complaint  Patient presents with  . Vaginal Discharge    Amanda [name] is a 20 y.o. female who presents for evaluation of vaginal discharge that has been ongoing for the last 6 weeks.  She states that she has seen her PCP for this and did topical antibiotics.  She was still having BV so she started p.o. antibiotics.  She was referred to OB/GYN.  She saw her OB/GYN and was still having symptoms and did antibiotics again.  She reports that he went she went to her OB/GYN earlier today for evaluation of continued vaginal discharge.  They did a pelvic exam and she was noted to be positive for BV.  They discharged her home with the antibiotics.  She comes into the emergency department today because she states that she does not feel like the antibiotics are going to work and wants to see what else can be done.  She states she was also tested for gonorrhea and chlamydia today.  She states occasionally, she has some abdominal cramping, mostly in the suprapubic region.  Currently denies any abdominal pain.  She states sometimes she has some nausea and she notices that certain things that used to make her gag or not making her gag anymore.  She states that usually when she changes her cats litter box, she gets nauseous but she has not recently.  No vomiting.  She is also states that she noticed a small area of erythema noted to her posterior flank that she wanted to get evaluated.  No fevers, dysuria, hematuria.  No chest pain, difficulty breathing.  She is not currently sexually active.   Vaginal Discharge Associated symptoms: no abdominal pain, no dysuria, no fever, no nausea and no vomiting        Past Medical History:  Diagnosis Date  . Asthma   . Attention deficit hyperactivity disorder   . Depression   . Eczema   . Mood disorder (HCC)   . Oppositional defiant  disorder     Patient Active Problem List   Diagnosis Date Noted  . Bipolar 1 disorder (HCC) 06/01/2020  . DMDD (disruptive mood dysregulation disorder) (HCC) 01/14/2014  . PTSD (post-traumatic stress disorder) 01/14/2014  . Deliberate self-cutting 01/14/2014  . Autism spectrum 01/14/2014  . ADHD (attention deficit hyperactivity disorder) 01/14/2014  . Aggression 01/14/2014  . Suicidal ideation 01/14/2014  . ODD (oppositional defiant disorder) 01/13/2014  . Oppositional defiant disorder 11/04/2013    Past Surgical History:  Procedure Laterality Date  . ADENOIDECTOMY    . TYMPANOSTOMY TUBE PLACEMENT       OB History   No obstetric history on file.     No family history on file.  Social History   Tobacco Use  . Smoking status: Never Smoker  . Smokeless tobacco: Never Used  Vaping Use  . Vaping Use: Every day  . Substances: Nicotine, Flavoring  Substance Use Topics  . Alcohol use: No  . Drug use: No    Home Medications Prior to Admission medications   Medication Sig Start Date End Date Taking? Authorizing Provider  ABILIFY MAINTENA 400 MG SRER injection Inject 1 mL (200 mg total) into the muscle every 30 (thirty) days. (Due on 06-27-20): For mood control 06/27/20   Armandina Stammer I, NP  albuterol (PROVENTIL HFA;VENTOLIN HFA) 108 (90 Base) MCG/ACT inhaler Inhale 1-2 puffs into the lungs  every 6 (six) hours as needed for wheezing or shortness of breath.    [provider]  citalopram (CELEXA) 20 MG tablet Take 1 tablet (20 mg total) by mouth daily. For depression Patient not taking: Reported on 06/24/2020 06/04/20   Armandina Stammer I, NP  hydrOXYzine (ATARAX/VISTARIL) 25 MG tablet Take 1 tablet (25 mg total) by mouth 3 (three) times daily as needed for anxiety. Patient taking differently: Take 25-50 mg by mouth daily as needed for anxiety.  06/04/20   Armandina Stammer I, NP  melatonin 5 MG TABS Take 1 tablet (5 mg total) by mouth at bedtime. For sleep 06/04/20   Armandina Stammer  I, NP  fluticasone (FLONASE) 50 MCG/ACT nasal spray Place 2 sprays into both nostrils daily. Patient not taking: Reported on 08/01/2019 12/15/18 08/01/19  Loren Racer, MD  loratadine (CLARITIN) 10 MG tablet Take 1 tablet (10 mg total) by mouth daily. Patient not taking: Reported on 05/28/2019 12/15/18 08/01/19  Loren Racer, MD    Allergies    Poison ivy extract [poison ivy extract] and Sulfa antibiotics  Review of Systems   Review of Systems  Constitutional: Negative for fever.  Respiratory: Negative for cough and shortness of breath.   Cardiovascular: Negative for chest pain.  Gastrointestinal: Negative for abdominal pain, nausea and vomiting.  Genitourinary: Positive for vaginal discharge. Negative for dysuria and hematuria.  All other systems reviewed and are negative.   Physical Exam Updated Vital Signs BP 132/78   Pulse 80   Temp 98.2 F (36.8 C) (Oral)   Resp 18   Ht 5\' 4"  (1.626 m)   Wt 108.9 kg   SpO2 100%   BMI 41.20 kg/m   Physical Exam Vitals and nursing note reviewed.  Constitutional:      Appearance: Normal appearance. She is well-developed.  HENT:     Head: Normocephalic and atraumatic.  Eyes:     General: Lids are normal.     Conjunctiva/sclera: Conjunctivae normal.     Pupils: Pupils are equal, round, and reactive to light.  Cardiovascular:     Rate and Rhythm: Normal rate.  Pulmonary:     Effort: Pulmonary effort is normal.  Abdominal:     Palpations: Abdomen is soft. Abdomen is not rigid.     Tenderness: There is no abdominal tenderness. There is no guarding.     Comments: Abdomen is soft, non-distended, non-tender. No rigidity, No guarding. No peritoneal signs.  Genitourinary:    Comments: The exam was performed with a chaperone present (Faith, Tech). Normal external female genitalia. No lesions, rash, or sores.  White discharge in vaginal canal.  Cervix visualized with no evidence of erythema, friability.  No CMT.  She reports feeling some  uncomfortable sensation with insertion on bimanual exam.  No adnexal mass or tenderness noted bilaterally. Musculoskeletal:        General: Normal range of motion.     Cervical back: Full passive range of motion without pain.  Skin:    General: Skin is warm and dry.     Capillary Refill: Capillary refill takes less than 2 seconds.          Comments: Small area of skin irritation noted to the right flank.  It is about 0.5 cm in size.  No surrounding erythema, edema, induration.  No fluctuance that would be concerning for an abscess.  Likely suspect ingrown hair versus irritated follicle.  Neurological:     Mental Status: She is alert and oriented to person, place,  and time.  Psychiatric:        Speech: Speech normal.     ED Results / Procedures / Treatments   Labs (all labs ordered are listed, but only abnormal results are displayed) Labs Reviewed  URINALYSIS, ROUTINE W REFLEX MICROSCOPIC - Abnormal; Notable for the following components:      Result Value   Hgb urine dipstick MODERATE (*)    All other components within normal limits  WET PREP, GENITAL  PREGNANCY, URINE    EKG None  Radiology No results found.  Procedures Procedures   Medications Ordered in ED Medications - No data to display  ED Course  I have reviewed the triage vital signs and the nursing notes.  Pertinent labs & imaging results that were available during my care of the patient were reviewed by me and considered in my medical decision making (see chart for details).    MDM Rules/Calculators/A&P                          20 year old female who presents for evaluation of vaginal discharge has been ongoing for 6 weeks.  Has seen both her PCP and OB/GYN.  Last saw her OB/GYN today who prescribed antibiotics but patient states she is concerned that they will not work.  She states that sometimes she will have some abdominal pain and nausea but denies any current symptoms.  No vomiting, fevers.  She is not  currently sexually active. Patient is afebrile, non-toxic appearing, sitting comfortably on examination table. Vital signs reviewed and stable.  Abdomen exam is benign.  We will plan for pelvic, urine.  We will obtain a wet prep to see if there is any true TRIC or yeast.  She had gonorrhea and Chlamydia testing today so do not feel that we need to repeat that.  Patient also reports that she is concerned about an area on her right flank.  She states this is been ongoing for months.  It does not cause her pain.  On my exam, she has a small 0.5 cm area of irritation that appears to either be a healing boil versus follicle.  No surrounding warmth, erythema, fluctuance that would be concerning for infectious process versus abscess that would need I&D here in the ED.  Pelvic exam as documented above.  Patient tolerated procedure well.  She does not have any CMT that would be concerning for PID.  She did have some white discharge noted.  Urine pregnancy negative. UA shows moderate Hgb. Wet prep negative for clue cells.  No trichomoniasis, yeast.  Discussed results with patient.  Patient is is well-appearing.  She has no abdominal tenderness noted on exam.  She has been given topical Flagyl by her OB/GYN.  I also discussed other options such as boric acid suppositories.  I discussed with her that if this continues, she needs to follow-up with her OB/GYN.  She has already been tested for gonorrhea and chlamydia today and has that pending with her OB/GYN. At this time, patient exhibits no emergent life-threatening condition that require further evaluation in ED. Patient had ample opportunity for questions and discussion. All patient's questions were answered with full understanding. Strict return precautions discussed. Patient expresses understanding and agreement to plan.   Portions of this note were generated with Scientist, clinical (histocompatibility and immunogenetics). Dictation errors may occur despite best attempts at proofreading.  Final  Clinical Impression(s) / ED Diagnoses Final diagnoses:  Vaginal discharge    Rx /  DC Orders ED Discharge Orders    None       Rosana HoesLayden, Samirah Scarpati A, PA-C 01/26/21 2238    Gwyneth SproutPlunkett, Whitney, MD 01/26/21 2239

## 2021-02-26 ENCOUNTER — Emergency Department (HOSPITAL_COMMUNITY)
Admission: EM | Admit: 2021-02-26 | Discharge: 2021-02-26 | Disposition: A | Discharge status: Home / Self Care | Payer: Medicaid Other | Attending: Emergency Medicine | Admitting: Emergency Medicine

## 2021-02-26 ENCOUNTER — Encounter (HOSPITAL_COMMUNITY): Payer: Self-pay | Admitting: Emergency Medicine

## 2021-02-26 DIAGNOSIS — S61011A Laceration without foreign body of right thumb without damage to nail, initial encounter: Secondary | ICD-10-CM | POA: Insufficient documentation

## 2021-02-26 DIAGNOSIS — F840 Autistic disorder: Secondary | ICD-10-CM | POA: Diagnosis not present

## 2021-02-26 DIAGNOSIS — W274XXA Contact with kitchen utensil, initial encounter: Secondary | ICD-10-CM | POA: Diagnosis not present

## 2021-02-26 DIAGNOSIS — Y990 Civilian activity done for income or pay: Secondary | ICD-10-CM | POA: Insufficient documentation

## 2021-02-26 DIAGNOSIS — S6991XA Unspecified injury of right wrist, hand and finger(s), initial encounter: Secondary | ICD-10-CM | POA: Diagnosis present

## 2021-02-26 DIAGNOSIS — J45909 Unspecified asthma, uncomplicated: Secondary | ICD-10-CM | POA: Diagnosis not present

## 2021-02-26 DIAGNOSIS — Z23 Encounter for immunization: Secondary | ICD-10-CM | POA: Diagnosis not present

## 2021-02-26 DIAGNOSIS — Y93G1 Activity, food preparation and clean up: Secondary | ICD-10-CM | POA: Insufficient documentation

## 2021-02-26 MED ORDER — LIDOCAINE HCL (PF) 1 % IJ SOLN
10.0000 mL | Freq: Once | INTRAMUSCULAR | Status: DC
  Filled 2021-02-26: qty 30

## 2021-02-26 MED ORDER — TETANUS-DIPHTH-ACELL PERTUSSIS 5-2.5-18.5 LF-MCG/0.5 IM SUSY
0.5000 mL | PREFILLED_SYRINGE | Freq: Once | INTRAMUSCULAR | Status: AC
  Administered 2021-02-26: 0.5 mL via INTRAMUSCULAR
  Filled 2021-02-26: qty 0.5

## 2021-02-26 NOTE — Discharge Instructions (Addendum)
1. Medications: Tylenol or ibuprofen for pain, usual home medications 2. Treatment: ice for swelling, keep wound clean with warm soap and water and keep bandage dry, do not submerge in water for 24 hours 3. Follow Up: Please return or follow up with primary care provider in 7 days to have your stitches/staples removed or sooner if you have concerns. Return to the emergency department for increased redness, drainage of pus from the wound, or fevers/chills.   WOUND CARE  Keep area clean and dry for 24 hours. Do not remove bandage, if applied.  After 24 hours, remove bandage and wash wound gently with mild soap and warm water. Reapply a new bandage after cleaning wound, if directed.   Continue daily cleansing with soap and water until stitches/staples are removed.  Do not apply any ointments or creams to the wound while stitches/staples are in place, as this may cause delayed healing. Return if you experience any of the following signs of infection: Swelling, redness, pus drainage, streaking, fever >101.0 F  Return if you experience excessive bleeding that does not stop after 15-20 minutes of constant, firm pressure.  

## 2021-02-26 NOTE — ED Provider Notes (Signed)
[facility]-EMERGENCY DEPT Provider Note   CSN: [id number] Arrival date & time: 02/26/21  1101     History Chief Complaint  Patient presents with   Extremity Laceration    Amanda [name] is a 20 y.o. female.  Amanda [name] is a 20 y.o. female with a history of autism,, who presents to the ED for evaluation of laceration to her right thumb.  Patient reports that just prior to arrival while at work at [employer] she was slicing lemons when her thumb got out of the slicer blade and it caused a small laceration to the lateral aspect of the thumb.  She reports the area kept bleeding so she came in for eval ration.  No injury to the nail.  Unsure of last tetanus vaccination.  The history is provided by the patient.      Past Medical History:  Diagnosis Date   Asthma    Attention deficit hyperactivity disorder    Depression    Eczema    Mood disorder (HCC)    Oppositional defiant disorder     Patient Active Problem List   Diagnosis Date Noted   Bipolar 1 disorder (HCC) 06/01/2020   DMDD (disruptive mood dysregulation disorder) (HCC) 01/14/2014   PTSD (post-traumatic stress disorder) 01/14/2014   Deliberate self-cutting 01/14/2014   Autism spectrum 01/14/2014   ADHD (attention deficit hyperactivity disorder) 01/14/2014   Aggression 01/14/2014   Suicidal ideation 01/14/2014   ODD (oppositional defiant disorder) 01/13/2014   Oppositional defiant disorder 11/04/2013    Past Surgical History:  Procedure Laterality Date   ADENOIDECTOMY     TYMPANOSTOMY TUBE PLACEMENT       OB History   No obstetric history on file.     History reviewed. No pertinent family history.  Social History   Tobacco Use   Smoking status: Never   Smokeless tobacco: Never  Vaping Use   Vaping Use: Every day   Substances: Nicotine, Flavoring  Substance Use Topics   Alcohol use: No   Drug use: No    Home Medications Prior to Admission medications   Medication Sig Start  Date End Date Taking? Authorizing Provider  ABILIFY MAINTENA 400 MG SRER injection Inject 1 mL (200 mg total) into the muscle every 30 (thirty) days. (Due on 06-27-20): For mood control 06/27/20   Armandina Stammer I, NP  albuterol (PROVENTIL HFA;VENTOLIN HFA) 108 (90 Base) MCG/ACT inhaler Inhale 1-2 puffs into the lungs every 6 (six) hours as needed for wheezing or shortness of breath.    [provider]  citalopram (CELEXA) 20 MG tablet Take 1 tablet (20 mg total) by mouth daily. For depression Patient not taking: Reported on 06/24/2020 06/04/20   Armandina Stammer I, NP  hydrOXYzine (ATARAX/VISTARIL) 25 MG tablet Take 1 tablet (25 mg total) by mouth 3 (three) times daily as needed for anxiety. Patient taking differently: Take 25-50 mg by mouth daily as needed for anxiety.  06/04/20   Armandina Stammer I, NP  melatonin 5 MG TABS Take 1 tablet (5 mg total) by mouth at bedtime. For sleep 06/04/20   Armandina Stammer I, NP  fluticasone (FLONASE) 50 MCG/ACT nasal spray Place 2 sprays into both nostrils daily. Patient not taking: Reported on 08/01/2019 12/15/18 08/01/19  Loren Racer, MD  loratadine (CLARITIN) 10 MG tablet Take 1 tablet (10 mg total) by mouth daily. Patient not taking: Reported on 05/28/2019 12/15/18 08/01/19  Loren Racer, MD    Allergies    Poison ivy extract [poison ivy extract]  and Sulfa antibiotics  Review of Systems   Review of Systems  Constitutional:  Negative for chills and fever.  Skin:  Positive for wound.  Neurological:  Negative for weakness and numbness.   Physical Exam Updated Vital Signs BP 128/88 (BP Location: Left Arm)   Pulse 94   Temp 98.2 F (36.8 C) (Oral)   Resp 18   LMP 02/26/2021   SpO2 96%   Physical Exam Vitals and nursing note reviewed.  Constitutional:      General: She is not in acute distress.    Appearance: Normal appearance. She is well-developed. She is not ill-appearing or diaphoretic.  HENT:     Head: Normocephalic and atraumatic.  Eyes:      General:        Right eye: No discharge.        Left eye: No discharge.  Pulmonary:     Effort: Pulmonary effort is normal. No respiratory distress.  Musculoskeletal:     Comments: 2.5 cm curved laceration over the lateral aspect of the right thumb just adjacent to the thumbnail, no active bleeding, distal cap refill is normal, normal sensation, normal flexion And extension  Neurological:     Mental Status: She is alert and oriented to person, place, and time.     Coordination: Coordination normal.  Psychiatric:        Mood and Affect: Mood normal.        Behavior: Behavior normal.    ED Results / Procedures / Treatments   Labs (all labs ordered are listed, but only abnormal results are displayed) Labs Reviewed - No data to display  EKG None  Radiology No results found.  Procedures .Marland KitchenLaceration Repair  Date/Time: 02/26/2021 5:13 PM Performed by: Dartha Lodge, PA-C Authorized by: Dartha Lodge, PA-C   Consent:    Consent obtained:  Verbal   Consent given by:  Patient   Risks, benefits, and alternatives were discussed: yes     Risks discussed:  Infection, pain, need for additional repair, poor cosmetic result and poor wound healing   Alternatives discussed:  No treatment Universal protocol:    Procedure explained and questions answered to patient or proxy's satisfaction: yes     Patient identity confirmed:  Verbally with patient Anesthesia:    Anesthesia method:  Nerve block   Block needle gauge:  25 G   Block anesthetic:  Lidocaine 1% w/o epi   Block injection procedure:  Anatomic landmarks identified and incremental injection   Block outcome:  Anesthesia achieved Laceration details:    Location:  Finger   Finger location:  R thumb   Length (cm):  2.5   Depth (mm):  3 Pre-procedure details:    Preparation:  Patient was prepped and draped in usual sterile fashion Exploration:    Hemostasis achieved with:  Direct pressure   Wound exploration: wound explored  through full range of motion and entire depth of wound visualized     Wound extent: areolar tissue violated   Treatment:    Area cleansed with:  Povidone-iodine   Amount of cleaning:  Standard   Irrigation solution:  Sterile saline   Debridement:  None   Undermining:  None Skin repair:    Repair method:  Sutures   Suture size:  5-0   Suture material:  Prolene   Suture technique:  Simple interrupted   Number of sutures:  2 Approximation:    Approximation:  Close Repair type:    Repair type:  Simple  Post-procedure details:    Dressing:  Non-adherent dressing   Procedure completion:  Tolerated well, no immediate complications   Medications Ordered in ED Medications  lidocaine (PF) (XYLOCAINE) 1 % injection 10 mL (has no administration in time range)  Tdap (BOOSTRIX) injection 0.5 mL (0.5 mLs Intramuscular Given 02/26/21 1217)    ED Course  I have reviewed the triage vital signs and the nursing notes.  Pertinent labs & imaging results that were available during my care of the patient were reviewed by me and considered in my medical decision making (see chart for details).    MDM Rules/Calculators/A&P                          Irrigation performed. Wound explored and base of wound visualized in a bloodless field without evidence of foreign body.  Repair which was well tolerated. Tdap updated.  Pt has  no comorbidities to effect normal wound healing. Pt discharged  without antibiotics.  Discussed suture home care with patient and answered questions. Pt to follow-up for wound check and suture removal in 7 days; they are to return to the ED sooner for signs of infection. Pt is hemodynamically stable with no complaints prior to dc.   Final Clinical Impression(s) / ED Diagnoses Final diagnoses:  Laceration of right thumb without foreign body without damage to nail, initial encounter    Rx / DC Orders ED Discharge Orders     None        Legrand Rams 02/26/21 1715     Vanetta Mulders, MD 03/04/21 1615

## 2021-02-26 NOTE — ED Triage Notes (Signed)
Pt presents via [facility] after cutting her R thumb on a knife in the kitchen. Alert and oriented. Bleeding controlled.

## 2021-03-09 ENCOUNTER — Encounter (HOSPITAL_COMMUNITY): Payer: Self-pay

## 2021-03-09 ENCOUNTER — Other Ambulatory Visit: Payer: Self-pay

## 2021-03-09 ENCOUNTER — Emergency Department (HOSPITAL_COMMUNITY)
Admission: EM | Admit: 2021-03-09 | Discharge: 2021-03-09 | Disposition: A | Discharge status: Home / Self Care | Payer: Medicaid Other | Attending: Emergency Medicine | Admitting: Emergency Medicine

## 2021-03-09 DIAGNOSIS — J45909 Unspecified asthma, uncomplicated: Secondary | ICD-10-CM | POA: Insufficient documentation

## 2021-03-09 DIAGNOSIS — F3131 Bipolar disorder, current episode depressed, mild: Secondary | ICD-10-CM | POA: Diagnosis not present

## 2021-03-09 DIAGNOSIS — F17200 Nicotine dependence, unspecified, uncomplicated: Secondary | ICD-10-CM | POA: Insufficient documentation

## 2021-03-09 DIAGNOSIS — R45851 Suicidal ideations: Secondary | ICD-10-CM | POA: Diagnosis not present

## 2021-03-09 NOTE — ED Provider Notes (Signed)
[facility]-EMERGENCY DEPT [facility] Emergency Department Provider Note MRN:  [id number]  Arrival date & time: 03/09/21     Chief Complaint   Suicidal   History of Present Illness   Amanda [name] is a 20 y.o. year-old female with a history of mood disorder presenting to the ED with chief complaint of SI.  Patient hearing voices this evening telling her to kill her self, telling her she is not worth it, telling her she will never find anybody.  Unsure if it is a different voice or her voice in her head.  A few alcoholic beverages yesterday evening but no other drugs.  Worsening depression over the past few weeks.  Denies any specific plan to kill self.  No bodily complaints.  Review of Systems  A complete 10 system review of systems was obtained and all systems are negative except as noted in the HPI and PMH.   Patient's Health History    Past Medical History:  Diagnosis Date   Asthma    Attention deficit hyperactivity disorder    Depression    Eczema    Mood disorder (HCC)    Oppositional defiant disorder     Past Surgical History:  Procedure Laterality Date   ADENOIDECTOMY     TYMPANOSTOMY TUBE PLACEMENT      No family history on file.  Social History   Socioeconomic History   Marital status: Single    Spouse name: Not on file   Number of children: Not on file   Years of education: Not on file   Highest education level: Not on file  Occupational History   Not on file  Tobacco Use   Smoking status: Never   Smokeless tobacco: Never  Vaping Use   Vaping Use: Every day   Substances: Nicotine, Flavoring  Substance and Sexual Activity   Alcohol use: No   Drug use: No   Sexual activity: Not on file  Other Topics Concern   Not on file  Social History Narrative   Not on file   Social Determinants of Health   Financial Resource Strain: Not on file  Food Insecurity: Not on file  Transportation Needs: Not on file  Physical Activity: Not on file  Stress: Not on  file  Social Connections: Not on file  Intimate Partner Violence: Not on file     Physical Exam   Vitals:   03/09/21 0405  BP: (!) 175/119  Pulse: 88  Resp: 20  Temp: 98.1 F (36.7 C)  SpO2: 93%    CONSTITUTIONAL: Well-appearing, NAD NEURO:  Alert and oriented x 3, no focal deficits EYES:  eyes equal and reactive ENT/NECK:  no LAD, no JVD CARDIO: Regular rate, well-perfused, normal S1 and S2 PULM:  CTAB no wheezing or rhonchi GI/GU:  normal bowel sounds, non-distended, non-tender MSK/SPINE:  No gross deformities, no edema SKIN:  no rash, atraumatic PSYCH:  Appropriate speech and behavior  *Additional and/or pertinent findings included in MDM below  Diagnostic and Interventional Summary    EKG Interpretation  Date/Time:    Ventricular Rate:    PR Interval:    QRS Duration:   QT Interval:    QTC Calculation:   R Axis:     Text Interpretation:          Labs Reviewed - No data to display   No orders to display    Medications - No data to display   Procedures  /  Critical Care Procedures  ED Course and Medical  Decision Making  I have reviewed the triage vital signs, the nursing notes, and pertinent available records from the EMR.  Listed above are laboratory and imaging tests that I personally ordered, reviewed, and interpreted and then considered in my medical decision making (see below for details).  SI, unsure if true hallucinations or just hearing her own voice in her head.  No specific plan.  Will consult TTS.     Patient has been cleared by TTS, with NP Babbitt recommending that patient contact her ECT first thing this morning.  Evaluated and cleared prior to labs, which are not necessary as patient has no bodily complaints.  Patient is appropriate for discharge.  Elmer Sow. Pilar Plate, MD [facility] Emergency Medicine [facility] [email or web address]  Final Clinical Impressions(s) / ED Diagnoses     ICD-10-CM   1. Suicidal thoughts   R45.851       ED Discharge Orders     None        Discharge Instructions Discussed with and Provided to Patient:     Discharge Instructions      You were evaluated in the Emergency Department and after careful evaluation, we did not find any emergent condition requiring admission or further testing in the hospital.  Your exam/testing today was overall reassuring.  Recommendation is for you to contact your ACT Team first thing this morning to determine if a change in your medication would be of benefit.   Please return to the Emergency Department if you experience any worsening of your condition.  Thank you for allowing [place] to be a part of your care.         Sabas Sous, MD 03/09/21 [phone]

## 2021-03-09 NOTE — ED Triage Notes (Signed)
Pt to ED from home with c/o SI. Pt has extensive mental health history and says she is hearing voices screaming at her. She states she wants to kill herself and "then just dissolve". Pt is tearful in triage. VSS.
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
## 2021-03-09 NOTE — Discharge Instructions (Addendum)
You were evaluated in the Emergency Department and after careful evaluation, we did not find any emergent condition requiring admission or further testing in the hospital.  Your exam/testing today was overall reassuring.  Recommendation is for you to contact your ACT Team first thing this morning to determine if a change in your medication would be of benefit.   Please return to the Emergency Department if you experience any worsening of your condition.  Thank you for allowing [place] to be a part of your care.

## 2021-04-05 ENCOUNTER — Emergency Department (HOSPITAL_COMMUNITY)
Admission: EM | Admit: 2021-04-05 | Discharge: 2021-04-06 | Disposition: A | Discharge status: Home / Self Care | Payer: Medicaid Other | Attending: Emergency Medicine | Admitting: Emergency Medicine

## 2021-04-05 ENCOUNTER — Encounter (HOSPITAL_COMMUNITY): Payer: Self-pay

## 2021-04-05 ENCOUNTER — Other Ambulatory Visit: Payer: Self-pay

## 2021-04-05 DIAGNOSIS — J020 Streptococcal pharyngitis: Secondary | ICD-10-CM | POA: Diagnosis not present

## 2021-04-05 DIAGNOSIS — Z20822 Contact with and (suspected) exposure to covid-19: Secondary | ICD-10-CM | POA: Diagnosis not present

## 2021-04-05 DIAGNOSIS — J45909 Unspecified asthma, uncomplicated: Secondary | ICD-10-CM | POA: Insufficient documentation

## 2021-04-05 DIAGNOSIS — R000 Tachycardia, unspecified: Secondary | ICD-10-CM | POA: Diagnosis not present

## 2021-04-05 DIAGNOSIS — J029 Acute pharyngitis, unspecified: Secondary | ICD-10-CM | POA: Diagnosis present

## 2021-04-05 LAB — POC SARS CORONAVIRUS 2 AG -  ED: SARSCOV2ONAVIRUS 2 AG: NEGATIVE

## 2021-04-05 LAB — GROUP A STREP BY PCR: Group A Strep by PCR: DETECTED — AB

## 2021-04-05 MED ORDER — PENICILLIN G BENZATHINE 1200000 UNIT/2ML IM SUSY
1.2000 10*6.[IU] | PREFILLED_SYRINGE | Freq: Once | INTRAMUSCULAR | Status: AC
  Administered 2021-04-06: 1.2 10*6.[IU] via INTRAMUSCULAR
  Filled 2021-04-05: qty 2

## 2021-04-05 NOTE — ED Triage Notes (Signed)
Pt reports sore throat that began today and one episode of vomiting.

## 2021-04-05 NOTE — Discharge Instructions (Addendum)
1. Medications: Tylenol or ibuprofen for fever, chills or sore throat, usual home medications 2. Treatment: rest, drink plenty of fluids 3. Follow Up: Please followup with your primary doctor in as needed for discussion of your diagnoses and further evaluation after today's visit; if you do not have a primary care doctor use the resource guide provided to find one; Please return to the ER for worsening pain, high fevers, difficulty swallowing or other concerns

## 2021-04-05 NOTE — ED Provider Notes (Signed)
[facility] Provider Note   CSN: [id number] Arrival date & time: 04/05/21  2150     History Chief Complaint  Patient presents with   Sore Throat    Amanda [name] is a 20 y.o. female presents to the Emergency Department complaining of gradual, persistent, progressively worsening sore throat onset yesterday. Associated symptoms include mild cough, subjective fevers, general malaise.  Pt also c/o mild nausea but no vomiting or diarrhea.  Aggravating factors include eating and drinking.  Alleviating factors include nothing.  Pt denies weakness, dizziness, syncope, dysuria.  Denies sick contacts.   Pt is vaccinated for COVID.  The history is provided by the patient and medical records. No language interpreter was used.      Past Medical History:  Diagnosis Date   Asthma    Attention deficit hyperactivity disorder    Depression    Eczema    Mood disorder (HCC)    Oppositional defiant disorder     Patient Active Problem List   Diagnosis Date Noted   Bipolar 1 disorder (HCC) 06/01/2020   DMDD (disruptive mood dysregulation disorder) (HCC) 01/14/2014   PTSD (post-traumatic stress disorder) 01/14/2014   Deliberate self-cutting 01/14/2014   Autism spectrum 01/14/2014   ADHD (attention deficit hyperactivity disorder) 01/14/2014   Aggression 01/14/2014   Suicidal ideation 01/14/2014   ODD (oppositional defiant disorder) 01/13/2014   Oppositional defiant disorder 11/04/2013    Past Surgical History:  Procedure Laterality Date   ADENOIDECTOMY     TYMPANOSTOMY TUBE PLACEMENT       OB History   No obstetric history on file.     History reviewed. No pertinent family history.  Social History   Tobacco Use   Smoking status: Never   Smokeless tobacco: Never  Vaping Use   Vaping Use: Every day   Substances: Nicotine, Flavoring  Substance Use Topics   Alcohol use: No   Drug use: No    Home Medications Prior to Admission  medications   Medication Sig Start Date End Date Taking? Authorizing Provider  ABILIFY MAINTENA 400 MG SRER injection Inject 1 mL (200 mg total) into the muscle every 30 (thirty) days. (Due on 06-27-20): For mood control 06/27/20  Yes Nwoko, Nicole Kindred I, NP  albuterol (PROVENTIL HFA;VENTOLIN HFA) 108 (90 Base) MCG/ACT inhaler Inhale 1-2 puffs into the lungs every 6 (six) hours as needed for wheezing or shortness of breath.   Yes [provider]  SEROQUEL 25 MG tablet Take 25 mg by mouth daily as needed. 02/16/21  Yes [provider]  fluticasone (FLONASE) 50 MCG/ACT nasal spray Place 2 sprays into both nostrils daily. Patient not taking: Reported on 08/01/2019 12/15/18 08/01/19  Loren Racer, MD  loratadine (CLARITIN) 10 MG tablet Take 1 tablet (10 mg total) by mouth daily. Patient not taking: Reported on 05/28/2019 12/15/18 08/01/19  Loren Racer, MD    Allergies    Poison ivy extract [poison ivy extract] and Sulfa antibiotics  Review of Systems   Review of Systems  Constitutional:  Positive for fatigue and fever. Negative for appetite change, diaphoresis and unexpected weight change.  HENT:  Negative for mouth sores.   Eyes:  Negative for visual disturbance.  Respiratory:  Positive for cough. Negative for chest tightness, shortness of breath and wheezing.   Cardiovascular:  Negative for chest pain.  Gastrointestinal:  Negative for abdominal pain, constipation, diarrhea, nausea and vomiting.  Endocrine: Negative for polydipsia, polyphagia and polyuria.  Genitourinary:  Negative for dysuria, frequency, hematuria and  urgency.  Musculoskeletal:  Negative for back pain and neck stiffness.  Skin:  Negative for rash.  Allergic/Immunologic: Negative for immunocompromised state.  Neurological:  Negative for syncope, light-headedness and headaches.  Hematological:  Does not bruise/bleed easily.  Psychiatric/Behavioral:  Negative for sleep disturbance. The patient is not  nervous/anxious.    Physical Exam Updated Vital Signs BP 129/87 (BP Location: Left Arm)   Pulse (!) 104   Temp 98.3 F (36.8 C) (Oral)   Resp 14   Ht 5\' 4"  (1.626 m)   Wt 108.9 kg   SpO2 99%   BMI 41.20 kg/m   Physical Exam Vitals and nursing note reviewed.  Constitutional:      General: She is not in acute distress.    Appearance: She is not diaphoretic.  HENT:     Head: Normocephalic.     Right Ear: Tympanic membrane normal.     Left Ear: Tympanic membrane normal.     Nose: Nose normal.     Mouth/Throat:     Mouth: Mucous membranes are moist.     Pharynx: Posterior oropharyngeal erythema present. No oropharyngeal exudate.  Eyes:     General: No scleral icterus.    Conjunctiva/sclera: Conjunctivae normal.  Cardiovascular:     Rate and Rhythm: Normal rate and regular rhythm.     Pulses: Normal pulses.          Radial pulses are 2+ on the right side and 2+ on the left side.  Pulmonary:     Effort: No tachypnea, accessory muscle usage, prolonged expiration, respiratory distress or retractions.     Breath sounds: No stridor.     Comments: Equal chest rise. No increased work of breathing. Abdominal:     General: There is no distension.     Palpations: Abdomen is soft.     Tenderness: There is no abdominal tenderness. There is no guarding or rebound.  Musculoskeletal:     Cervical back: Normal range of motion.     Comments: Moves all extremities equally and without difficulty.  Skin:    General: Skin is warm and dry.     Capillary Refill: Capillary refill takes less than 2 seconds.  Neurological:     Mental Status: She is alert.     GCS: GCS eye subscore is 4. GCS verbal subscore is 5. GCS motor subscore is 6.     Comments: Speech is clear and goal oriented.  Psychiatric:        Mood and Affect: Mood normal.    ED Results / Procedures / Treatments   Labs (all labs ordered are listed, but only abnormal results are displayed) Labs Reviewed  GROUP A STREP BY PCR  - Abnormal; Notable for the following components:      Result Value   Group A Strep by PCR DETECTED (*)    All other components within normal limits  POC SARS CORONAVIRUS 2 AG -  ED     Procedures Procedures   Medications Ordered in ED Medications  penicillin g benzathine (BICILLIN LA) 1200000 UNIT/2ML injection 1.2 Million Units (has no administration in time range)    ED Course  I have reviewed the triage vital signs and the nursing notes.  Pertinent labs & imaging results that were available during my care of the patient were reviewed by me and considered in my medical decision making (see chart for details).    MDM Rules/Calculators/A&P  Pt presents with URI symptoms.  Well appearing and well hydrated on exam. Tachycardia noted on triage vitals, but no tachycardia on physical exam.    11:56 PM COVID test negative.  Strep test positive.  Discussed oral antibiotics versus IM penicillin.  Patient wishes for IM penicillin.  Ordered here in the emergency department.  Discussed home therapies and reasons to return to the emergency department.  Patient states understanding and is in agreement with the plan.   Final Clinical Impression(s) / ED Diagnoses Final diagnoses:  Strep pharyngitis    Rx / DC Orders ED Discharge Orders     None        Branden Shallenberger, Boyd Kerbs 04/05/21 2358    Margarita Grizzle, MD 04/07/21 [phone]

## 2021-07-02 ENCOUNTER — Other Ambulatory Visit: Payer: Self-pay

## 2021-07-02 ENCOUNTER — Encounter (HOSPITAL_COMMUNITY): Payer: Self-pay | Admitting: Student

## 2021-07-02 DIAGNOSIS — Z5321 Procedure and treatment not carried out due to patient leaving prior to being seen by health care provider: Secondary | ICD-10-CM | POA: Diagnosis not present

## 2021-07-02 DIAGNOSIS — R079 Chest pain, unspecified: Secondary | ICD-10-CM | POA: Diagnosis not present

## 2021-07-02 NOTE — ED Triage Notes (Signed)
Patient BIB [facility] central chest pain that started this morning after sneezing 5 times.  Was seen at [facility] and had a chest xray, felt light headed while at [facility] and told them.  Patient started feeling bad so she called EMS.  Patient is autistic.   160/90 70-HR 108-CBG 99% on room air 97.7 temp

## 2021-07-03 ENCOUNTER — Emergency Department (HOSPITAL_COMMUNITY)
Admission: EM | Admit: 2021-07-03 | Discharge: 2021-07-03 | Disposition: A | Discharge status: Home / Self Care | Payer: Medicare Other | Attending: Emergency Medicine | Admitting: Emergency Medicine

## 2021-07-03 NOTE — ED Notes (Signed)
Pt called for vitals no answer. °

## 2021-07-03 NOTE — ED Notes (Signed)
Pt called for vital signs with no answer 

## 2021-10-21 NOTE — Progress Notes (Signed)
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 Amanda [name] is a 21 y.o. female with a past medical history of Asthma, Bipolar disorder. The patient presented for initial endocrinology clinic visit on 10/22/2021 for consultative assistance with her cushing syndrome .   Pt is here for evaluation of cushing syndrome. She presented to her PCP with c/o fatigue and weight gain in 07/2021. She was noted to have a buffalo hump. Her labs showed normal K, GFR and Alk . Phos   Today she is  accompanied by her mother.   She has been noted with an almost 30 lbs weight gain from 07/2020 to 01/2021 and since then she has been having a gradual weight gain    She has been noticed with recurrent  vaginal infections , buffalo hump and abdominal stretch marks   Per mother pt has been gaining weight since age 72 , has been on medications that would have caused weight gain.   She walks every where but her diet is not the best   Has had irregular menstruations , currently on Kylena IUD  No Hx of HTN a of DM   For Asthma she was on Albuterol and advair but no recurrent prednisone tapers. She has had prednisone course for 5 days, 2 months ago for bronchial prednisone.   Smokes marijuana  Has extensive history of depression, anxiety, hx of suicidal ideation      HISTORY:  Past Medical History:  Past Medical History:  Diagnosis Date   Asthma    Attention deficit hyperactivity disorder    Depression    Eczema    Mood disorder ([place])    Oppositional defiant disorder    Past Surgical History:  Past Surgical History:  Procedure Laterality Date   ADENOIDECTOMY     TYMPANOSTOMY TUBE PLACEMENT      Social History:  reports that she has never smoked. She has never used smokeless tobacco. She reports  current drug use. Drug: Marijuana. She reports that she does not drink alcohol. Family History: family history is not on file.   HOME MEDICATIONS: Allergies as of 10/22/2021       Reactions   Poison Ivy Extract [poison Ivy Extract] Hives, Itching, Rash   Sulfa Antibiotics Itching, Swelling, Rash, Other (See Comments)   Unknown        Medication List        Accurate as of October 22, 2021  8:11 AM. If you have any questions, ask your nurse or doctor.          STOP taking these medications    SEROquel 25 MG tablet Generic drug: QUEtiapine Stopped by: Dorita Sciara, MD       TAKE these medications    Abilify Maintena 400 MG Srer injection Generic drug: ARIPiprazole ER Inject 1 mL (200 mg total) into the muscle every 30 (thirty) days. (Due on 06-27-20): For mood control   albuterol 108 (90 Base) MCG/ACT inhaler Commonly known as: VENTOLIN HFA Inhale 1-2 puffs into the lungs every 6 (six) hours as needed for wheezing or shortness of breath.   atomoxetine 10 MG capsule Commonly known as: STRATTERA Take 10 mg by mouth daily.          REVIEW OF  SYSTEMS: A comprehensive ROS was conducted with the patient and is negative except as per HPI    OBJECTIVE:  VS: BP 124/72 (BP Location: Left Arm, Patient Position: Sitting, Cuff Size: Large)    Pulse 84    Ht 5' 4" (1.626 m)    Wt 248 lb (112.5 kg)    SpO2 99%    BMI 42.57 kg/m    Wt Readings from Last 3 Encounters:  10/22/21 248 lb (112.5 kg)  07/02/21 240 lb (108.9 kg)  04/05/21 240 lb (108.9 kg)     EXAM: General: Pt appears well and is in NAD  Eyes: External eye exam normal without stare, lid lag or exophthalmos.  EOM intact.    Ears, Nose, Throat: Hearing: Grossly intact bilaterally Dental: Good dentition  Throat: Clear without mass, erythema or exudate  Neck: General: Supple without adenopathy. Thyroid: Thyroid size normal.  No goiter or nodules appreciated.   Lungs: Clear with good BS bilat with  no rales, rhonchi, or wheezes  Heart: Auscultation: RRR.  Abdomen: Normoactive bowel sounds, soft, nontender, scattered wide violaceous abdominal wall striea  Extremities:  BL LE: No pretibial edema normal ROM and strength. Sit and stand test was done x3 without difficulty   Skin: Hair: Texture and amount normal with gender appropriate distribution Skin Inspection: closed comedones on the neck , no hirsutism  Skin Palpation: Skin temperature, texture, and thickness normal to palpation  Mental Status: Judgment, insight: Intact Orientation: Oriented to time, place, and person Mood and affect: No depression, anxiety, or agitation     DATA REVIEWED:  Latest Reference Range & Units 10/22/21 08:15  Sodium 135 - 145 mEq/L 138  Potassium 3.5 - 5.1 mEq/L 4.4  Chloride 96 - 112 mEq/L 105  CO2 19 - 32 mEq/L 26  Glucose 70 - 99 mg/dL 107 (H)  BUN 6 - 23 mg/dL 15  Creatinine 0.40 - 1.20 mg/dL 0.79  Calcium 8.4 - 10.5 mg/dL 9.9  GFR >60.00 mL/min 105.87      Latest Reference Range & Units 10/22/21 08:15  Cortisol, Plasma ug/dL 12.0    ASSESSMENT/PLAN/RECOMMENDATIONS:   Cushingoid features:  - Pt with weight gain, buffalo hump, abdominal wall striae we will proceed with Cushing syndrome screening, we will start with 24-hour urine collection for free cortisol, the second step will include dexamethasone suppression test - We discussed pathophysiology of cushing syndrome and briefly discussed  -BMP today is normal as well as cortisol level    Follow-up pending lab results Signed electronically by: Mack Guise, MD  [facility] Endocrinology  [facility] [address] [place], [place] [address] Phone: [phone] FAX: [phone]   CC: Trey Sailors, [address] [place] [address] Phone: [phone] Fax: [phone]   Return to Endocrinology clinic as below: No future appointments.

## 2021-10-22 ENCOUNTER — Other Ambulatory Visit: Payer: Self-pay

## 2021-10-22 ENCOUNTER — Ambulatory Visit: Payer: Medicare Other | Admitting: Internal Medicine

## 2021-10-22 ENCOUNTER — Encounter: Payer: Self-pay | Admitting: Internal Medicine

## 2021-10-22 VITALS — BP 124/72 | HR 84 | Ht 64.0 in | Wt 248.0 lb

## 2021-10-22 DIAGNOSIS — R6889 Other general symptoms and signs: Secondary | ICD-10-CM | POA: Diagnosis not present

## 2021-10-22 LAB — BASIC METABOLIC PANEL
BUN: 15 mg/dL (ref 6–23)
CO2: 26 mEq/L (ref 19–32)
Calcium: 9.9 mg/dL (ref 8.4–10.5)
Chloride: 105 mEq/L (ref 96–112)
Creatinine, Ser: 0.79 mg/dL (ref 0.40–1.20)
GFR: 105.87 mL/min (ref >60.00)
Glucose, Bld: 107 mg/dL — ABNORMAL HIGH (ref 70–99)
Potassium: 4.4 mEq/L (ref 3.5–5.1)
Sodium: 138 mEq/L (ref 135–145)

## 2021-10-22 LAB — CORTISOL: Cortisol, Plasma: 12 ug/dL

## 2021-10-22 NOTE — Patient Instructions (Addendum)
24-Hour Urine Collection  You will be collecting your urine for a 24-hour period of time. Your timer starts with your first urine of the morning (For example - If you first pee at [place], your timer will start at [place]) [place] away your first urine of the morning Collect your urine every time you pee for the next 24 hours STOP your urine collection 24 hours after you started the collection (For example - You would stop at 9AM the day after you started)      Instructions for Dexamethasone Suppression Test   Step 1: Choose a morning when you can come to our lab at 8:00 am for a blood draw.   Step 2: On the night before the blood draw, take one 1 mg tablet of dexamethasone at 11:30 pm.  The timing is VERY important!   Step 3: The next morning, go to the lab for blood work at 8:00 am.  Dennis Bast do not have to be on an empty stomach, but the timing is VERY important!

## 2021-10-29 LAB — ACTH: C206 ACTH: 23 pg/mL (ref 6–50)

## 2021-10-30 ENCOUNTER — Other Ambulatory Visit: Payer: Self-pay

## 2021-10-30 ENCOUNTER — Other Ambulatory Visit: Payer: Medicare Other

## 2021-10-30 DIAGNOSIS — R6889 Other general symptoms and signs: Secondary | ICD-10-CM

## 2021-10-30 NOTE — Progress Notes (Unsigned)
Total Volume 1,100.  Started 10-29-2021 at 9:05 am.  Ended 10-30-2021 at 8:58.

## 2021-11-01 ENCOUNTER — Emergency Department (HOSPITAL_COMMUNITY)
Admission: EM | Admit: 2021-11-01 | Discharge: 2021-11-01 | Disposition: A | Discharge status: Home / Self Care | Payer: Medicare Other | Attending: Emergency Medicine | Admitting: Emergency Medicine

## 2021-11-01 ENCOUNTER — Encounter (HOSPITAL_COMMUNITY): Payer: Self-pay

## 2021-11-01 ENCOUNTER — Other Ambulatory Visit: Payer: Self-pay

## 2021-11-01 DIAGNOSIS — T7840XA Allergy, unspecified, initial encounter: Secondary | ICD-10-CM | POA: Diagnosis present

## 2021-11-01 DIAGNOSIS — R0989 Other specified symptoms and signs involving the circulatory and respiratory systems: Secondary | ICD-10-CM | POA: Diagnosis not present

## 2021-11-01 DIAGNOSIS — R000 Tachycardia, unspecified: Secondary | ICD-10-CM | POA: Diagnosis not present

## 2021-11-01 DIAGNOSIS — T7809XA Anaphylactic reaction due to other food products, initial encounter: Secondary | ICD-10-CM | POA: Diagnosis not present

## 2021-11-01 LAB — I-STAT BETA HCG BLOOD, ED (MC, WL, AP ONLY): I-stat hCG, quantitative: <5 m[IU]/mL (ref <5)

## 2021-11-01 MED ORDER — EPINEPHRINE 0.3 MG/0.3ML IJ SOAJ: 0.3000 mg | INTRAMUSCULAR | 0 refills | Status: DC | PRN

## 2021-11-01 MED ORDER — DIPHENHYDRAMINE HCL 25 MG PO TABS: 25.0000 mg | ORAL_TABLET | Freq: Four times a day (QID) | ORAL | 0 refills | Status: DC | PRN

## 2021-11-01 MED ORDER — PREDNISONE 20 MG PO TABS
40.0000 mg | ORAL_TABLET | Freq: Every day | ORAL | 0 refills | Status: AC
Start: 2021-11-01 — End: 2021-11-06

## 2021-11-01 MED ORDER — HYDROXYZINE HCL 25 MG PO TABS
25.0000 mg | ORAL_TABLET | Freq: Once | ORAL | Status: AC
Start: 2021-11-01 — End: 2021-11-01
  Administered 2021-11-01: 25 mg via ORAL
  Filled 2021-11-01: qty 1

## 2021-11-01 MED ORDER — FAMOTIDINE 20 MG PO TABS: 20.0000 mg | ORAL_TABLET | Freq: Every day | ORAL | 0 refills | Status: DC

## 2021-11-01 MED ORDER — PREDNISONE 20 MG PO TABS
60.0000 mg | ORAL_TABLET | Freq: Once | ORAL | Status: AC
  Administered 2021-11-01: 60 mg via ORAL
  Filled 2021-11-01: qty 3

## 2021-11-01 MED ORDER — FAMOTIDINE 20 MG PO TABS
20.0000 mg | ORAL_TABLET | Freq: Once | ORAL | Status: AC
  Administered 2021-11-01: 20 mg via ORAL
  Filled 2021-11-01: qty 1

## 2021-11-01 NOTE — Discharge Instructions (Addendum)
It was a pleasure taking care of you today. You were seen for an allergic reaction. I am sending you home with steroids, pepcid, and benadryl. Take for the next 5 days. Follow-up with PCP within the next week for further evaluation. Return to the ER for new or worsening symptoms.

## 2021-11-01 NOTE — ED Triage Notes (Signed)
Pt BIB EMS. Pt states that she ate chinese food an hr ago and feels like her throat was swelling. No SHOB, no difficulty breathing. Pt took a delta 8 gummy prior to eating the [place] food.   110 hr 168/98 bp 100 % room air  79 cbg

## 2021-11-01 NOTE — ED Provider Notes (Signed)
[facility]-EMERGENCY DEPT Provider Note   CSN: [id number] Arrival date & time: 11/01/21  2058     History  Chief Complaint  Patient presents with   Allergic Reaction    Amanda [name] is a 21 y.o. female with a past medical history significant for oppositional defiant disorder, PTSD, ADHD, and bipolar 1 disorder who presents to the ED due to concerns about an allergic reaction.  Patient states she ate [place] food 1 hour prior to arrival and notes she felt a weird sensation in her throat.  Patient notes she felt like her throat was "closing up".  Denies shortness of breath.  Denies rash.  No vomiting.  Denies abdominal pain.  Patient states she took a delta 8 gummy prior to eating [place] food.  Patient states she has an allergy to "certain breeds of dogs" and swells due to pineapple however, no history of anaphylaxis.  She notes she has eaten this [place] food before with no previous allergic reactions. She took 50mg  benadryl prior to arrival.      Home Medications Prior to Admission medications   Medication Sig Start Date End Date Taking? Authorizing Provider  diphenhydrAMINE (BENADRYL) 25 MG tablet Take 1 tablet (25 mg total) by mouth every 6 (six) hours as needed. 11/01/21  Yes Aissa Lisowski, Merla Riches, PA-C  EPINEPHrine 0.3 mg/0.3 mL IJ SOAJ injection Inject 0.3 mg into the muscle as needed for anaphylaxis. 11/01/21  Yes Shella Lahman, Merla Riches, PA-C  famotidine (PEPCID) 20 MG tablet Take 1 tablet (20 mg total) by mouth daily for 5 days. 11/01/21 11/06/21 Yes Khalifa Knecht, Merla Riches, PA-C  predniSONE (DELTASONE) 20 MG tablet Take 2 tablets (40 mg total) by mouth daily for 5 days. 11/01/21 11/06/21 Yes Ikeisha Blumberg, Merla Riches, PA-C  ABILIFY MAINTENA 400 MG SRER injection Inject 1 mL (200 mg total) into the muscle every 30 (thirty) days. (Due on 06-27-20): For mood control 06/27/20   Armandina Stammer I, NP  albuterol (PROVENTIL HFA;VENTOLIN HFA) 108 (90 Base) MCG/ACT inhaler Inhale 1-2 puffs  into the lungs every 6 (six) hours as needed for wheezing or shortness of breath.    [provider]  atomoxetine (STRATTERA) 10 MG capsule Take 10 mg by mouth daily.    [provider]  fluticasone (FLONASE) 50 MCG/ACT nasal spray Place 2 sprays into both nostrils daily. Patient not taking: Reported on 08/01/2019 12/15/18 08/01/19  Loren Racer, MD  loratadine (CLARITIN) 10 MG tablet Take 1 tablet (10 mg total) by mouth daily. Patient not taking: Reported on 05/28/2019 12/15/18 08/01/19  Loren Racer, MD      Allergies    Poison ivy extract [poison ivy extract] and Sulfa antibiotics    Review of Systems   Review of Systems  HENT:  Positive for trouble swallowing. Negative for sore throat and voice change.   Respiratory:  Negative for shortness of breath, wheezing and stridor.   Gastrointestinal:  Negative for abdominal pain and vomiting.   Physical Exam Updated Vital Signs BP (!) 145/108 (BP Location: Left Arm)    Pulse (!) 103    Temp 98.3 F (36.8 C) (Oral)    Resp 16    Ht 5\' 4"  (1.626 m)    Wt 113 kg    SpO2 99%    BMI 42.76 kg/m  Physical Exam Vitals and nursing note reviewed.  Constitutional:      General: She is not in acute distress.    Appearance: She is not ill-appearing.  HENT:  Head: Normocephalic.  Eyes:     Pupils: Pupils are equal, round, and reactive to light.  Cardiovascular:     Rate and Rhythm: Normal rate and regular rhythm.     Pulses: Normal pulses.     Heart sounds: Normal heart sounds. No murmur heard.   No friction rub. No gallop.  Pulmonary:     Effort: Pulmonary effort is normal.     Breath sounds: Normal breath sounds.     Comments: Airway patent.  No stridor or wheeze.  Patient speaking in full sentences. Abdominal:     General: Abdomen is flat. There is no distension.     Palpations: Abdomen is soft.     Tenderness: There is no abdominal tenderness. There is no guarding or rebound.  Musculoskeletal:        General:  Normal range of motion.     Cervical back: Neck supple.  Skin:    General: Skin is warm and dry.     Comments: No rash  Neurological:     General: No focal deficit present.     Mental Status: She is alert.  Psychiatric:        Mood and Affect: Mood normal.        Behavior: Behavior normal.    ED Results / Procedures / Treatments   Labs (all labs ordered are listed, but only abnormal results are displayed) Labs Reviewed  I-STAT BETA HCG BLOOD, ED ([facility], [facility], [facility] ONLY)    EKG None  Radiology No results found.  Procedures Procedures    Medications Ordered in ED Medications  predniSONE (DELTASONE) tablet 60 mg (60 mg Oral Given 11/01/21 2206)  famotidine (PEPCID) tablet 20 mg (20 mg Oral Given 11/01/21 2206)  hydrOXYzine (ATARAX) tablet 25 mg (25 mg Oral Given 11/01/21 2206)    ED Course/ Medical Decision Making/ A&P                           Medical Decision Making Amount and/or Complexity of Data Reviewed Independent Historian: friend    Details: boyfriend on phone provided some history  Risk Prescription drug management.  21 year old female presents to the ED due to concerns about an allergic reaction after eating [place] food 1 hour prior to arrival.  No previous anaphylactic reactions.  Patient admits to an allergy to "certain breeds of dogs" and pineapple.  Patient states she felt like her throat was closing up.  Denies shortness of breath.  No wheezing.  Denies nausea, vomiting, abdominal pain, and rash.  Upon arrival, patient mildly tachycardic at 103; however, patient appears anxious at bedside.  Patient in no acute distress.  Benign physical exam.  Airway patent.  Lungs clear to auscultation bilaterally without stridor or wheeze.  No rash.  Abdomen soft, nondistended, nontender.  Patient appears extremely anxious at bedside.  Low suspicion for anaphylactic reaction so will hold on epinephrine at this time.  Patient already took Benadryl prior to arrival.  Will give  Pepcid and prednisone.  We will continue to observe patient here in the ED.  10:44 PM reassessed patient after Pepcid, prednisone, and vistaril. She notes improvement in symptoms. No longer feels like her throat is closing. Lungs clear to auscultation bilaterally. No stridor or wheeze. No signs of anaphylaxis.   11:17 PM reassessed patient. Patient notes resolution in symptoms. No evidence of anaphylaxis. Patient has been observed for over 2 hours without any evidence of respiratory distress or anaphylaxis. Feel patient  is stable for discharge. No epinephrine required. Patient discharged with Pepcid, benadryl, and prednisone. Strict ED precautions discussed with patient. Patient states understanding and agrees to plan. Patient discharged home in no acute distress and stable vitals        Final Clinical Impression(s) / ED Diagnoses Final diagnoses:  Allergic reaction, initial encounter    Rx / DC Orders ED Discharge Orders          Ordered    famotidine (PEPCID) 20 MG tablet  Daily        11/01/21 2246    diphenhydrAMINE (BENADRYL) 25 MG tablet  Every 6 hours PRN        11/01/21 2246    predniSONE (DELTASONE) 20 MG tablet  Daily        11/01/21 2246    EPINEPHrine 0.3 mg/0.3 mL IJ SOAJ injection  As needed        11/01/21 2321              Jesusita Oka 11/01/21 2321    Cheryll Cockayne, MD 11/08/21 [phone]

## 2021-11-05 ENCOUNTER — Encounter: Payer: Self-pay | Admitting: Internal Medicine

## 2021-11-05 LAB — CORTISOL, URINE, 24 HOUR
24 Hour urine volume (VMAHVA): 1100 mL
CREATININE, URINE: 1.71 g/(24.h) (ref 0.50–2.15)
Cortisol (Ur), Free: 20.6 mcg/24 h (ref 4.0–50.0)

## 2022-06-09 ENCOUNTER — Encounter: Payer: Self-pay | Admitting: Allergy

## 2022-06-09 ENCOUNTER — Ambulatory Visit (INDEPENDENT_AMBULATORY_CARE_PROVIDER_SITE_OTHER): Payer: Medicare Other | Admitting: Allergy

## 2022-06-09 VITALS — BP 108/60 | HR 75 | Temp 98.1°F | Resp 16 | Ht 64.37 in | Wt 239.0 lb

## 2022-06-09 DIAGNOSIS — T781XXD Other adverse food reactions, not elsewhere classified, subsequent encounter: Secondary | ICD-10-CM | POA: Diagnosis not present

## 2022-06-09 DIAGNOSIS — J4540 Moderate persistent asthma, uncomplicated: Secondary | ICD-10-CM | POA: Diagnosis not present

## 2022-06-09 DIAGNOSIS — J3089 Other allergic rhinitis: Secondary | ICD-10-CM | POA: Diagnosis not present

## 2022-06-09 DIAGNOSIS — H1013 Acute atopic conjunctivitis, bilateral: Secondary | ICD-10-CM | POA: Diagnosis not present

## 2022-06-09 MED ORDER — ALBUTEROL SULFATE HFA 108 (90 BASE) MCG/ACT IN AERS: 2.0000 | INHALATION_SPRAY | RESPIRATORY_TRACT | 1 refills | Status: DC | PRN

## 2022-06-09 MED ORDER — BUDESONIDE-FORMOTEROL FUMARATE 160-4.5 MCG/ACT IN AERO: 2.0000 | INHALATION_SPRAY | Freq: Two times a day (BID) | RESPIRATORY_TRACT | 5 refills | Status: DC

## 2022-06-09 NOTE — Progress Notes (Signed)
New Patient Note  RE: Amanda [name] MRN: [id number] DOB: 2000/10/07 Date of Office Visit: 06/09/2022  Primary care provider: Trey Sailors, PA  Chief Complaint: asthma and allergies  History of present illness: Amanda [name] is a 21 y.o. female presenting today for evaluation of asthma and allergies.   She has history of asthma diagnosed in childhood.  She does report she'll get short of breath with activity like walking up hills, running.  She reports shortness of breath currently.  She states she is getting over a URI and has been coughing more due to that.  She states it is not Covid and has not had fever.  When her asthma is flaring she reports symptoms of cough, wheeze, shortness of breath, chest tightness.  She has not identified other triggers of her asthma.  She has an albuterol inhaler but states she mostly will use her albuterol via nebulizer.  She reports albuterol use on average twice a week for symptoms.  She will employ deep breathing techniques as well.  She has been prescribed Advair discus about 3 years ago but does not like using as it has a taste and is a powder.  Has not used in past 2 years.  She has needed to take prednisone for asthma flare and states she likely has had a course prednisone within the last year.  She states she may have had hospitalization for asthma as a child but need to confirm with her mother.  She states she was allergic to foods as a child and that she outgrew these.  However she feels that she might be allergic to new foods now that she has been eating on a routine basis. She state she was eating shrimp regularly at least weekly but then one day a few months ago she had shrimp egg rolls with duck sauce and she developed sensation that her throat was closing up.  She states her throat felt tight and thick.  She was able to talk.  She states it was kind of difficult to swallow.  She took benadryl but it was not helping much so she went to ED.    She has been avoiding shrimp and the duck sauce.  She would really like to eat shellfish if she is actually not allergic.  She has eaten fish since without issue.  She states that the duck sauce was and apricot based sauce.  She believes she is allergic to pineapple, mango and kiwi and has had facial swelling and tongue tingling.    She has an epipen.    She can have itchy/watery eyes,, runny/stuffy nose and sneezing. She states she does take hydroxyzine for her anxiety and sometimes helps her allergy symptoms. She has not used any eyedrops for her itchy/watery eyes.  She has used flonase in the past.    She has an itchy rash on her left underarm area that she is not sure what caused it.    She also reports medication reaction.  She developed a rash on the trunk on right side with fluoxetine use.  She reports itching and swelling with sulfa antibiotics.     Review of systems: Review of Systems  Constitutional: Negative.   HENT: Negative.    Eyes: Negative.   Respiratory:  Positive for shortness of breath.   Cardiovascular: Negative.   Gastrointestinal: Negative.   Musculoskeletal: Negative.   Skin: Negative.   Allergic/Immunologic: Negative.   Neurological: Negative.     All other systems negative  unless noted above in HPI  Past medical history: Past Medical History:  Diagnosis Date   Asthma    Attention deficit hyperactivity disorder    Depression    Eczema    Mood disorder (HCC)    Oppositional defiant disorder     Past surgical history: Past Surgical History:  Procedure Laterality Date   ADENOIDECTOMY     TYMPANOSTOMY TUBE PLACEMENT      Family history:  Family History  Problem Relation Age of Onset   Food Allergy Sister     Social history:  Lives in apartment with carpeting with electric heating and central cooling.  Cats in the home.  There is no concern for roaches.  There is concern for water damage or mildew in the home.  She is unemployed.  She denies  a smoking history of cigarettes.  She does vapes with cbd and smokes marijuana.   Medication List: Current Outpatient Medications  Medication Sig Dispense Refill   ABILIFY MAINTENA 400 MG SRER injection Inject 1 mL (200 mg total) into the muscle every 30 (thirty) days. (Due on 06-27-20): For mood control 1 each 0   budesonide-formoterol (SYMBICORT) 160-4.5 MCG/ACT inhaler Inhale 2 puffs into the lungs in the morning and at bedtime. 1 each 5   desvenlafaxine (PRISTIQ) 50 MG 24 hr tablet      diphenhydrAMINE (BENADRYL) 25 MG tablet Take 1 tablet (25 mg total) by mouth every 6 (six) hours as needed. 30 tablet 0   EPINEPHrine 0.3 mg/0.3 mL IJ SOAJ injection Inject 0.3 mg into the muscle as needed for anaphylaxis. 1 each 0   famotidine (PEPCID) 20 MG tablet      hydrOXYzine (VISTARIL) 25 MG capsule Take 25 mg by mouth 3 (three) times daily.     METRONIDAZOLE PO Take 500 mg by mouth daily. 14 tablets     ondansetron (ZOFRAN) 4 MG tablet Take 2 tablets every 8 hours by oral route as needed.     topiramate (TOPAMAX) 50 MG tablet Take 50 mg by mouth 3 (three) times daily.     albuterol (VENTOLIN HFA) 108 (90 Base) MCG/ACT inhaler Inhale 2 puffs into the lungs every 4 (four) hours as needed for wheezing or shortness of breath. 18 g 1   atomoxetine (STRATTERA) 10 MG capsule Take 10 mg by mouth daily. (Patient not taking: Reported on 06/09/2022)     famotidine (PEPCID) 20 MG tablet Take 1 tablet (20 mg total) by mouth daily for 5 days. 5 tablet 0   No current facility-administered medications for this visit.    Known medication allergies: Allergies  Allergen Reactions   Poison Ivy Extract [Poison Ivy Extract] Hives, Itching and Rash   Sulfa Antibiotics Itching, Swelling, Rash and Other (See Comments)    Unknown     Physical examination: Blood pressure 108/60, pulse 75, temperature 98.1 F (36.7 C), temperature source Temporal, resp. rate 16, height 5' 4.37" (1.635 m), weight 239 lb (108.4 kg),  SpO2 98 %.  General: Alert, interactive, in no acute distress. HEENT: PERRLA, TMs pearly gray, turbinates moderately edematous without discharge, post-pharynx non erythematous. Neck: Supple without lymphadenopathy. Lungs: Clear to auscultation without wheezing, rhonchi or rales. {no increased work of breathing. CV: Normal S1, S2 without murmurs. Abdomen: Nondistended, nontender. Skin: Mildly erythematous patch with fine papules of the left axilla area . Extremities:  No clubbing, cyanosis or edema. Neuro:   Grossly intact.  Diagnositics/Labs:  Spirometry: FEV1: 2.19L 66%, FVC: 2.75L 72% predicted.  S/p albuterol she  had a 47% increase in FEV1 to 3.22 L or 98% which normalized and is significant  Allergy testing:  Airborne Adult Perc - 06/09/22 1639     Time Antigen Placed 1434    Allergen Manufacturer Lavella Hammock    Location Back    Number of Test 59    1. Control-Buffer 50% Glycerol Negative    2. Control-Histamine 1 mg/ml 2+    3. Albumin saline Negative    4. Leeds Negative    5. [place] Negative    6. Johnson Negative    7. Ansley Blue Negative    8. Meadow Fescue Negative    9. Perennial Rye Negative    10. Sweet Vernal Negative    11. Timothy Negative    12. Cocklebur Negative    13. Burweed Marshelder Negative    14. Ragweed, short Negative    15. Ragweed, Giant Negative    16. Plantain,  English Negative    17. Lamb's Quarters 2+    18. Sheep Sorrell Negative    19. Rough Pigweed Negative    20. Marsh Elder, Rough Negative    21. Mugwort, Common Negative    22. Ash mix Negative    23. Birch mix Negative    24. Beech American Negative    25. Box, Elder Negative    26. Cedar, red Negative    27. Cottonwood, [place] Negative    28. Elm mix Negative    29. Hickory Negative    30. Maple mix Negative    31. Oak, [place] mix Negative    32. Pecan Pollen Negative    33. Pine mix Negative    34. Sycamore Eastern Negative    35. [place], Black Pollen Negative    36.  Alternaria alternata 2+    37. Cladosporium Herbarum Negative    38. Aspergillus mix Negative    39. Penicillium mix Negative    40. Bipolaris sorokiniana (Helminthosporium) Negative    41. Drechslera spicifera (Curvularia) Negative    42. Mucor plumbeus Negative    43. Fusarium moniliforme Negative    44. Aureobasidium pullulans (pullulara) Negative    45. Rhizopus oryzae Negative    46. Botrytis cinera Negative    47. Epicoccum nigrum Negative    48. Phoma betae Negative    49. Candida Albicans Negative    50. Trichophyton mentagrophytes Negative    51. Mite, D Farinae  5,000 AU/ml Negative    52. Mite, D Pteronyssinus  5,000 AU/ml Negative    53. Cat Hair 10,000 BAU/ml Negative    54.  Dog Epithelia Negative    55. Mixed Feathers Negative    56. Horse Epithelia Negative    57. Cockroach, German Negative    58. Mouse Negative    59. Tobacco Leaf Negative             Food Adult Perc - 06/09/22 1600     Time Antigen Placed 1434    Allergen Manufacturer Lavella Hammock    Location Back    Number of allergen test 8    2. Soybean Negative    25. Shrimp Negative    26. Crab Negative    27. Lobster Negative    28. Oyster Negative    29. Scallops Negative    59. Peach Negative    63. Pineapple Negative             Allergy testing results were read and interpreted by provider, documented by clinical staff.   Assessment  and plan: Moderate persistent asthma  -  Lung function testing normalized after albuterol use . - Daily controller medication(s): Symbicort 170mcg 2 puffs twice daily with spacer - Prior to physical activity: albuterol 2 puffs 10-15 minutes before physical activity. - Rescue medications: albuterol 2 puffs every 4-6 hours as needed  - Asthma control goals:  * Full participation in all desired activities (may need albuterol before activity) * Albuterol use two time or less a week on average (not counting use with activity) * Cough interfering with sleep two  time or less a month * Oral steroids no more than once a year * No hospitalizations  Allergic rhinitis with conjunctivitis - Testing today showed: weeds and outdoor molds. - Copy of test results provided.  - Avoidance measures provided. - Start taking: Xyzal (levocetirizine) 5mg  tablet once daily.  This is a long-acting antihistamine for general allergy symptom control.  Nasacort (triamcinolone) two sprays per nostril daily (AIM FOR EAR ON EACH SIDE) for nasal congestion.   Astelin (azelastine) 2 sprays per nostril 1-2 times daily as needed for nasal drainage control.  Pataday (olopatadine) one drop per eye daily as needed for itchy/watery eyes.  - You can use an extra dose of the antihistamine, if needed, for breakthrough symptoms.   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  Will obtain serum IgE levels for these foods as well apricot and plum - Continue avoidance of shellfish and duck sauce until labs return.  If labs are negative or low then you would be eligible for in-office food challenge.  - Have access to self-injectable epinephrine (Epipen or AuviQ) 0.3mg  at all times - Follow emergency action plan in case of allergic reaction  - We have discussed the following in regards to foods:   Allergy: food allergy is when you have eaten a food, developed an allergic reaction after eating the food and have IgE to the food (positive food testing either by skin testing or blood testing).  Food allergy could lead to life threatening symptoms  Sensitivity: occurs when you have IgE to a food (positive food testing either by skin testing or blood testing) but is a food you eat without any issues.  This is not an allergy and we recommend keeping the food in the diet  Intolerance: this is when you have negative testing by either skin testing or blood testing thus not allergic but the food causes symptoms (like  belly pain, bloating, diarrhea etc) with ingestion.  These foods should be avoided to prevent symptoms.    - The oral allergy syndrome (OAS) or pollen-food allergy syndrome (PFAS) is a relatively common form of food allergy, particularly in adults. It typically occurs in people who have pollen allergies when the immune system "sees" proteins on the food that look like proteins on the pollen. This results in the allergy antibody (IgE) binding to the food instead of the pollen. Patients typically report itching and/or mild swelling of the mouth and throat immediately following ingestion of certain uncooked fruits (including nuts) or raw vegetables. Only a very small number of affected individuals experience systemic allergic reactions, such as anaphylaxis which occurs with true food allergies.     Follow-up in 6 months or sooner if needed.    I appreciate the opportunity to take part in Yumi's care. Please do not hesitate to contact me with questions.  Sincerely,   Prudy Feeler, MD Allergy/Immunology [facility]

## 2022-06-09 NOTE — Patient Instructions (Signed)
-    Lung function testing normalized after albuterol use . - Daily controller medication(s): Symbicort 189mcg 2 puffs twice daily with spacer - Prior to physical activity: albuterol 2 puffs 10-15 minutes before physical activity. - Rescue medications: albuterol 2 puffs every 4-6 hours as needed  - Asthma control goals:  * Full participation in all desired activities (may need albuterol before activity) * Albuterol use two time or less a week on average (not counting use with activity) * Cough interfering with sleep two time or less a month * Oral steroids no more than once a year * No hospitalizations  - Testing today showed: weeds and outdoor molds. - Copy of test results provided.  - Avoidance measures provided. - Start taking: Xyzal (levocetirizine) 5mg  tablet once daily.  This is a long-acting antihistamine for general allergy symptom control.  Nasacort (triamcinolone) two sprays per nostril daily (AIM FOR EAR ON EACH SIDE) for nasal congestion.   Astelin (azelastine) 2 sprays per nostril 1-2 times daily as needed for nasal drainage control.  Pataday (olopatadine) one drop per eye daily as needed for itchy/watery eyes.  - You can use an extra dose of the antihistamine, if needed, for breakthrough symptoms.   - Food allergy testing is negative for shellfish, soybean, peach and pineapple.  Will obtain serum IgE levels for these foods as well apricot and plum - Continue avoidance of shellfish and duck sauce until labs return.  If labs are negative or low then you would be eligible for in-office food challenge.  - Have access to self-injectable epinephrine (Epipen or AuviQ) 0.3mg  at all times - Follow emergency action plan in case of allergic reaction  - We have discussed the following in regards to foods:   Allergy: food allergy is when you have eaten a food, developed an allergic reaction after eating the food and have IgE to the food (positive food testing either by skin testing or blood  testing).  Food allergy could lead to life threatening symptoms  Sensitivity: occurs when you have IgE to a food (positive food testing either by skin testing or blood testing) but is a food you eat without any issues.  This is not an allergy and we recommend keeping the food in the diet  Intolerance: this is when you have negative testing by either skin testing or blood testing thus not allergic but the food causes symptoms (like belly pain, bloating, diarrhea etc) with ingestion.  These foods should be avoided to prevent symptoms.     - The oral allergy syndrome (OAS) or pollen-food allergy syndrome (PFAS) is a relatively common form of food allergy, particularly in adults. It typically occurs in people who have pollen allergies when the immune system "sees" proteins on the food that look like proteins on the pollen. This results in the allergy antibody (IgE) binding to the food instead of the pollen. Patients typically report itching and/or mild swelling of the mouth and throat immediately following ingestion of certain uncooked fruits (including nuts) or raw vegetables. Only a very small number of affected individuals experience systemic allergic reactions, such as anaphylaxis which occurs with true food allergies.      Follow-up in 6 months or sooner if needed

## 2022-06-09 NOTE — Addendum Note (Signed)
Addended by: Eloy End D on: 06/09/2022 05:09 PM   Modules accepted: Orders

## 2022-06-10 ENCOUNTER — Telehealth: Payer: Self-pay | Admitting: Allergy

## 2022-06-10 MED ORDER — AZELASTINE HCL 0.1 % NA SOLN: 2.0000 | Freq: Two times a day (BID) | NASAL | 5 refills | Status: DC

## 2022-06-10 MED ORDER — LEVOCETIRIZINE DIHYDROCHLORIDE 5 MG PO TABS: 5.0000 mg | ORAL_TABLET | Freq: Every evening | ORAL | 5 refills | Status: DC

## 2022-06-10 MED ORDER — OLOPATADINE HCL 0.2 % OP SOLN
1.0000 [drp] | Freq: Every day | OPHTHALMIC | 5 refills | Status: DC
Start: 2022-06-10 — End: 2024-07-23

## 2022-06-10 MED ORDER — TRIAMCINOLONE ACETONIDE 55 MCG/ACT NA AERO: 2.0000 | INHALATION_SPRAY | Freq: Every day | NASAL | 5 refills | Status: DC

## 2022-06-10 NOTE — Telephone Encounter (Signed)
Pt states only two of her meds were sent in to the pharmacy

## 2022-06-10 NOTE — Telephone Encounter (Signed)
All of the prescription medications were sent into the pharmacy yesterday. However, will send in the OTC medication as well. Can not guarantee that the insurance will pay for these medications because they are available OTC.

## 2022-06-10 NOTE — Addendum Note (Signed)
Addended by: Eloy End D on: 06/10/2022 11:42 AM   Modules accepted: Orders

## 2022-06-11 LAB — ALLERGEN, MANGO, F91: Mango IgE: <0.1 kU/L

## 2022-06-11 LAB — ALLERGEN, KIWI FRUIT, F84: Kiwi Fruit: <0.1 kU/L

## 2022-06-13 LAB — F255-IGE PLUM: F255-IgE Plum: <0.1 kU/L

## 2022-06-13 LAB — TRYPTASE: Tryptase: 5.5 ug/L (ref 2.2–13.2)

## 2022-06-13 LAB — ALLERGEN, PINEAPPLE, F210: Pineapple IgE: <0.1 kU/L

## 2022-06-13 LAB — ALLERGEN PROFILE, SHELLFISH
Clam IgE: <0.1 kU/L
F023-IgE Crab: <0.1 kU/L
F080-IgE Lobster: <0.1 kU/L
F290-IgE Oyster: <0.1 kU/L
Scallop IgE: <0.1 kU/L
Shrimp IgE: <0.1 kU/L

## 2022-06-13 LAB — ALLERGEN, APRICOT, F237: Allergen Apricot: <0.1 kU/L

## 2022-06-13 LAB — ALLERGEN SOYBEAN: Soybean IgE: <0.1 kU/L

## 2022-07-19 ENCOUNTER — Other Ambulatory Visit: Payer: Self-pay | Admitting: Allergy

## 2022-08-17 ENCOUNTER — Other Ambulatory Visit: Payer: Self-pay | Admitting: Allergy

## 2022-08-22 ENCOUNTER — Emergency Department (HOSPITAL_COMMUNITY)
Admission: EM | Admit: 2022-08-22 | Discharge: 2022-08-22 | Discharge status: Against Medical Advice | Payer: Medicare Other | Attending: Emergency Medicine | Admitting: Emergency Medicine

## 2022-08-22 ENCOUNTER — Other Ambulatory Visit: Payer: Self-pay

## 2022-08-22 DIAGNOSIS — Z5321 Procedure and treatment not carried out due to patient leaving prior to being seen by health care provider: Secondary | ICD-10-CM | POA: Insufficient documentation

## 2022-08-22 DIAGNOSIS — X58XXXA Exposure to other specified factors, initial encounter: Secondary | ICD-10-CM | POA: Insufficient documentation

## 2022-08-22 DIAGNOSIS — T192XXA Foreign body in vulva and vagina, initial encounter: Secondary | ICD-10-CM | POA: Diagnosis present

## 2022-08-22 NOTE — ED Triage Notes (Signed)
BIB EMS from home, has menstrual cup stuck in vagina, this was her first time using this. Got stuck this past Friday but bf was able to remove it, he tried today but she was bleeding too heavy.

## 2022-08-22 NOTE — ED Provider Triage Note (Signed)
Emergency Medicine Provider Triage Evaluation Note  Amanda [name] , a 21 y.o. female  was evaluated in triage.  Pt complains of fb in vagina. Pt report she has a menstrual cup in place during her menstruation and having difficulty removing it.  Endorse pelvic cramp.    Review of Systems  Positive: As above Negative: As above  Physical Exam  BP (!) 115/90 (BP Location: Left Arm)   Pulse 71   Temp 98.3 F (36.8 C) (Oral)   Resp 18   SpO2 96%  Gen:   Awake, no distress   Resp:  Normal effort  MSK:   Moves extremities without difficulty  Other:    Medical Decision Making  Medically screening exam initiated at 12:10 PM.  Appropriate orders placed.  Amanda [name] was informed that the remainder of the evaluation will be completed by another provider, this initial triage assessment does not replace that evaluation, and the importance of remaining in the ED until their evaluation is complete.     Amanda Helper, PA-C 08/22/22 1211

## 2022-10-14 ENCOUNTER — Other Ambulatory Visit: Payer: Self-pay | Admitting: Allergy

## 2022-11-09 ENCOUNTER — Other Ambulatory Visit: Payer: Self-pay | Admitting: Allergy

## 2022-11-22 ENCOUNTER — Other Ambulatory Visit: Payer: Self-pay | Admitting: *Deleted

## 2022-11-22 MED ORDER — AZELASTINE HCL 0.1 % NA SOLN: 2.0000 | Freq: Two times a day (BID) | NASAL | 5 refills | Status: DC

## 2022-11-22 MED ORDER — SYMBICORT 160-4.5 MCG/ACT IN AERO: 2.0000 | INHALATION_SPRAY | Freq: Two times a day (BID) | RESPIRATORY_TRACT | 5 refills | Status: DC

## 2022-12-06 ENCOUNTER — Other Ambulatory Visit: Payer: Self-pay | Admitting: Allergy

## 2022-12-08 ENCOUNTER — Ambulatory Visit: Payer: Medicare Other | Admitting: Allergy

## 2023-01-04 ENCOUNTER — Other Ambulatory Visit: Payer: Self-pay | Admitting: Allergy

## 2023-03-15 ENCOUNTER — Emergency Department (HOSPITAL_COMMUNITY): Payer: Medicare Other

## 2023-03-15 ENCOUNTER — Other Ambulatory Visit: Payer: Self-pay

## 2023-03-15 ENCOUNTER — Emergency Department (HOSPITAL_COMMUNITY)
Admission: EM | Admit: 2023-03-15 | Discharge: 2023-03-16 | Discharge status: Against Medical Advice | Payer: Medicare Other | Attending: Emergency Medicine | Admitting: Emergency Medicine

## 2023-03-15 ENCOUNTER — Encounter (HOSPITAL_COMMUNITY): Payer: Self-pay

## 2023-03-15 DIAGNOSIS — Z5321 Procedure and treatment not carried out due to patient leaving prior to being seen by health care provider: Secondary | ICD-10-CM | POA: Diagnosis not present

## 2023-03-15 DIAGNOSIS — R002 Palpitations: Secondary | ICD-10-CM | POA: Diagnosis present

## 2023-03-15 DIAGNOSIS — R252 Cramp and spasm: Secondary | ICD-10-CM | POA: Insufficient documentation

## 2023-03-15 LAB — CBC
HCT: 37.2 % (ref 36.0–46.0)
Hemoglobin: 12.8 g/dL (ref 12.0–15.0)
MCH: 30 pg (ref 26.0–34.0)
MCHC: 34.4 g/dL (ref 30.0–36.0)
MCV: 87.3 fL (ref 80.0–100.0)
Platelets: 275 10*3/uL (ref 150–400)
RBC: 4.26 MIL/uL (ref 3.87–5.11)
RDW: 12.3 % (ref 11.5–15.5)
WBC: 12.7 10*3/uL — ABNORMAL HIGH (ref 4.0–10.5)
nRBC: 0 % (ref 0.0–0.2)

## 2023-03-15 LAB — BASIC METABOLIC PANEL
Anion gap: 8 (ref 5–15)
BUN: 14 mg/dL (ref 6–20)
CO2: 23 mmol/L (ref 22–32)
Calcium: 9.2 mg/dL (ref 8.9–10.3)
Chloride: 105 mmol/L (ref 98–111)
Creatinine, Ser: 0.89 mg/dL (ref 0.44–1.00)
GFR, Estimated: >60 mL/min (ref >60)
Glucose, Bld: 124 mg/dL — ABNORMAL HIGH (ref 70–99)
Potassium: 3.5 mmol/L (ref 3.5–5.1)
Sodium: 136 mmol/L (ref 135–145)

## 2023-03-15 LAB — HCG, SERUM, QUALITATIVE: Preg, Serum: NEGATIVE

## 2023-03-15 NOTE — ED Notes (Signed)
Patient not in waiting room, called for x1.

## 2023-03-15 NOTE — ED Triage Notes (Signed)
Patient brought in by [facility], reports lying in bed tonight and started having palpitations. States she does have a murmur. Also reports tonight starting new medication tizanadine for muscle spasms in right arm. Also states she is supposed to start taking cipro for UTI tomorrow.

## 2023-04-18 ENCOUNTER — Other Ambulatory Visit: Payer: Self-pay | Admitting: Allergy

## 2023-05-03 ENCOUNTER — Other Ambulatory Visit: Payer: Self-pay | Admitting: Allergy

## 2023-05-05 ENCOUNTER — Ambulatory Visit (INDEPENDENT_AMBULATORY_CARE_PROVIDER_SITE_OTHER): Payer: Medicare Other | Admitting: Plastic Surgery

## 2023-05-05 ENCOUNTER — Encounter: Payer: Self-pay | Admitting: Plastic Surgery

## 2023-05-05 ENCOUNTER — Other Ambulatory Visit: Payer: Self-pay | Admitting: Plastic Surgery

## 2023-05-05 VITALS — BP 120/81 | HR 75 | Ht 64.0 in | Wt 239.0 lb

## 2023-05-05 DIAGNOSIS — L989 Disorder of the skin and subcutaneous tissue, unspecified: Secondary | ICD-10-CM

## 2023-05-05 DIAGNOSIS — N611 Abscess of the breast and nipple: Secondary | ICD-10-CM | POA: Diagnosis not present

## 2023-05-05 DIAGNOSIS — N6011 Diffuse cystic mastopathy of right breast: Secondary | ICD-10-CM

## 2023-05-05 NOTE — Progress Notes (Signed)
Referring Provider Norm Salt, PA [address] [place],  [place] [address]   CC:  Chief Complaint  Patient presents with   Consult           Amanda [name] is an 22 y.o. female.  HPI: Amanda [name] is a 22 year old female who presents today with complaints of a draining wound on the posterior aspect of her left breast.  The draining wound has been there for at least 6 months and she has been treated unsuccessfully with antibiotics.  She has tried topical treatments and has squeezed the wound to drain it.  The wound remains present and draining.  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  She has no other specific complaints but does note that she has densely fibrocystic breasts.  Allergies  Allergen Reactions   Poison Ivy Extract [Poison Ivy Extract] Hives, Itching and Rash   Sulfa Antibiotics Itching, Swelling, Rash and Other (See Comments)    Unknown   Bee Pollen    Molds & Smuts     Outpatient Encounter Medications as of 05/05/2023  Medication Sig   ABILIFY MAINTENA 400 MG SRER injection Inject 1 mL (200 mg total) into the muscle every 30 (thirty) days. (Due on 06-27-20): For mood control   ARIPiprazole (ABILIFY) 10 MG tablet Take 10 mg by mouth daily.   famotidine (PEPCID) 20 MG tablet    QUEtiapine (SEROQUEL) 25 MG tablet Take 25 mg by mouth 2 (two) times daily as needed.   atomoxetine (STRATTERA) 10 MG capsule Take 10 mg by mouth daily. (Patient not taking: Reported on 05/05/2023)   azelastine (ASTELIN) 0.1 % nasal spray Place 2 sprays into both nostrils 2 (two) times daily. Use in each nostril as directed (Patient not taking: Reported on 05/05/2023)   desvenlafaxine (PRISTIQ) 50 MG 24 hr tablet  (Patient not taking: Reported on 05/05/2023)   diphenhydrAMINE (BENADRYL) 25 MG tablet Take 1 tablet (25 mg total) by mouth every 6 (six) hours as needed. (Patient not taking: Reported on 05/05/2023)   EPINEPHrine 0.3 mg/0.3 mL IJ SOAJ  injection Inject 0.3 mg into the muscle as needed for anaphylaxis. (Patient not taking: Reported on 05/05/2023)   famotidine (PEPCID) 20 MG tablet Take 1 tablet (20 mg total) by mouth daily for 5 days.   hydrOXYzine (VISTARIL) 25 MG capsule Take 25 mg by mouth 3 (three) times daily. (Patient not taking: Reported on 05/05/2023)   levocetirizine (XYZAL) 5 MG tablet Take 1 tablet (5 mg total) by mouth every evening. (Patient not taking: Reported on 05/05/2023)   METRONIDAZOLE PO Take 500 mg by mouth daily. 14 tablets (Patient not taking: Reported on 05/05/2023)   Olopatadine HCl 0.2 % SOLN Apply 1 drop to eye daily. (Patient not taking: Reported on 05/05/2023)   ondansetron (ZOFRAN) 4 MG tablet Take 2 tablets every 8 hours by oral route as needed. (Patient not taking: Reported on 05/05/2023)   SYMBICORT 160-4.5 MCG/ACT inhaler Inhale 2 puffs into the lungs in the morning and at bedtime. (Patient not taking: Reported on 05/05/2023)   topiramate (TOPAMAX) 50 MG tablet Take 50 mg by mouth 3 (three) times daily. (Patient not taking: Reported on 05/05/2023)   triamcinolone (NASACORT ALLERGY 24HR) 55 MCG/ACT AERO nasal inhaler Place 2 sprays into the nose daily. (Patient not taking: Reported on 05/05/2023)   VENTOLIN HFA 108 (90 Base) MCG/ACT inhaler INHALE TWO PUFFS INTO THE LUNGS EVERY 4 HOURS AS NEEDED FOR WHEEZING  AND SHORTNESS OF BREATH (Patient not taking: Reported on 05/05/2023)   [DISCONTINUED] fluticasone (FLONASE) 50 MCG/ACT nasal spray Place 2 sprays into both nostrils daily. (Patient not taking: Reported on 08/01/2019)   [DISCONTINUED] loratadine (CLARITIN) 10 MG tablet Take 1 tablet (10 mg total) by mouth daily. (Patient not taking: Reported on 05/28/2019)   No facility-administered encounter medications on file as of 05/05/2023.     Past Medical History:  Diagnosis Date   Asthma    Attention deficit hyperactivity disorder    Depression    Eczema    Mood disorder (HCC)    Oppositional defiant disorder      Past Surgical History:  Procedure Laterality Date   ADENOIDECTOMY     TYMPANOSTOMY TUBE PLACEMENT      Family History  Problem Relation Age of Onset   Food Allergy Sister     Social History   Social History Narrative   Not on file     Review of Systems General: Denies fevers, chills, weight loss CV: Denies chest pain, shortness of breath, palpitations Breast: Draining wound on the posterior aspect of the left breast  Physical Exam    05/05/2023    3:17 PM 03/15/2023    9:18 PM 08/22/2022   12:07 PM  Vitals with BMI  Height 5\' 4"  5\' 4"    Weight 239 lbs 233 lbs   BMI 41 39.97   Systolic 120 117 474  Diastolic 81 83 90  Pulse 75 97 71    General:  No acute distress,  Alert and oriented, Non-Toxic, Normal speech and affect Breast: Patient has large pendulous ptotic breasts.  They are very dense with diffuse fibrocystic changes.  On the posterior aspect of the left breast there is a superficial wound that is approximately 5 x 2 cm patient states that there is been drainage from it today but there is no active drainage at the moment. Mammogram: No mammograms due to age Assessment/Plan Abscess: Patient has a small superficial abscess on the posterior aspect of the left breast.  This has been there for several months and is not healed with conservative or antibiotic therapy.  I believe that she should go to the operating room to have this excised.  Prior to taking her to the operating room I would like for her to have a mammogram and an ultrasound if indicated.  Will place this order today.  Will also schedule her for operative intervention.  Photographs obtained today with her consent.  Santiago Glad 05/05/2023, 3:47 PM

## 2023-05-06 ENCOUNTER — Other Ambulatory Visit: Payer: Medicare Other

## 2023-05-06 ENCOUNTER — Other Ambulatory Visit: Payer: Self-pay

## 2023-05-06 ENCOUNTER — Emergency Department (HOSPITAL_COMMUNITY)
Admission: EM | Admit: 2023-05-06 | Discharge: 2023-05-06 | Disposition: A | Discharge status: Home / Self Care | Payer: Medicare Other | Attending: Emergency Medicine | Admitting: Emergency Medicine

## 2023-05-06 ENCOUNTER — Encounter (HOSPITAL_COMMUNITY): Payer: Self-pay

## 2023-05-06 DIAGNOSIS — J45909 Unspecified asthma, uncomplicated: Secondary | ICD-10-CM | POA: Diagnosis not present

## 2023-05-06 DIAGNOSIS — K0889 Other specified disorders of teeth and supporting structures: Secondary | ICD-10-CM | POA: Diagnosis present

## 2023-05-06 DIAGNOSIS — F840 Autistic disorder: Secondary | ICD-10-CM | POA: Diagnosis not present

## 2023-05-06 MED ORDER — HYDROCODONE-ACETAMINOPHEN 5-325 MG PO TABS: 1.0000 | ORAL_TABLET | ORAL | 0 refills | Status: DC | PRN

## 2023-05-06 MED ORDER — NAPROXEN 375 MG PO TABS: 375.0000 mg | ORAL_TABLET | Freq: Two times a day (BID) | ORAL | 0 refills | Status: DC

## 2023-05-06 MED ORDER — HYDROXYZINE HCL 25 MG PO TABS
25.0000 mg | ORAL_TABLET | Freq: Once | ORAL | Status: AC
  Administered 2023-05-06: 25 mg via ORAL
  Filled 2023-05-06: qty 1

## 2023-05-06 MED ORDER — HYDROCODONE-ACETAMINOPHEN 5-325 MG PO TABS
1.0000 | ORAL_TABLET | Freq: Once | ORAL | Status: AC
  Administered 2023-05-06: 1 via ORAL
  Filled 2023-05-06: qty 1

## 2023-05-06 MED ORDER — AMOXICILLIN 500 MG PO CAPS: 500.0000 mg | ORAL_CAPSULE | Freq: Three times a day (TID) | ORAL | 0 refills | Status: DC

## 2023-05-06 NOTE — ED Provider Notes (Signed)
[facility]MENT AT [facility] Provider Note   CSN: [id number] Arrival date & time: 05/06/23  1210     History  Chief Complaint  Patient presents with   Dental Problem    Amanda [name] is a 22 y.o. female pmhx of asthma, anxiety, depression, autism, and  PCOS presenting with tooth pain on left lower jaw. She has had pain on and off for months, but this acutely worsened at 3am. Pain is located over tooth in left lower jaw, radiates to left jaw, rates pain 10/10. She tried alternating Tylenol and Ibuprofen w/o relief and had significant anxiety due to her pain. She reports she has not been able to get in to see a dentist. Reports allergy to sulfa abx. Denies fevers, chills, swelling, or other sx.   HPI     Home Medications Prior to Admission medications   Medication Sig Start Date End Date Taking? Authorizing Provider  ABILIFY MAINTENA 400 MG SRER injection Inject 1 mL (200 mg total) into the muscle every 30 (thirty) days. (Due on 06-27-20): For mood control 06/27/20   Armandina Stammer I, NP  ARIPiprazole (ABILIFY) 10 MG tablet Take 10 mg by mouth daily. 04/18/23   [provider]  atomoxetine (STRATTERA) 10 MG capsule Take 10 mg by mouth daily. Patient not taking: Reported on 05/05/2023    [provider]  azelastine (ASTELIN) 0.1 % nasal spray Place 2 sprays into both nostrils 2 (two) times daily. Use in each nostril as directed Patient not taking: Reported on 05/05/2023 11/22/22   Marcelyn Bruins, MD  desvenlafaxine (PRISTIQ) 50 MG 24 hr tablet     [provider]  diphenhydrAMINE (BENADRYL) 25 MG tablet Take 1 tablet (25 mg total) by mouth every 6 (six) hours as needed. Patient not taking: Reported on 05/05/2023 11/01/21   Mannie Stabile, PA-C  EPINEPHrine 0.3 mg/0.3 mL IJ SOAJ injection Inject 0.3 mg into the muscle as needed for anaphylaxis. Patient not taking: Reported on 05/05/2023 11/01/21   Mannie Stabile, PA-C   famotidine (PEPCID) 20 MG tablet Take 1 tablet (20 mg total) by mouth daily for 5 days. 11/01/21 11/06/21  Mannie Stabile, PA-C  famotidine (PEPCID) 20 MG tablet     [provider]  hydrOXYzine (VISTARIL) 25 MG capsule Take 25 mg by mouth 3 (three) times daily. Patient not taking: Reported on 05/05/2023 06/07/22   [provider]  levocetirizine (XYZAL) 5 MG tablet Take 1 tablet (5 mg total) by mouth every evening. Patient not taking: Reported on 05/05/2023 06/10/22   Marcelyn Bruins, MD  METRONIDAZOLE PO Take 500 mg by mouth daily. 14 tablets Patient not taking: Reported on 05/05/2023    [provider]  Olopatadine HCl 0.2 % SOLN Apply 1 drop to eye daily. Patient not taking: Reported on 05/05/2023 06/10/22   Marcelyn Bruins, MD  ondansetron (ZOFRAN) 4 MG tablet Take 2 tablets every 8 hours by oral route as needed. Patient not taking: Reported on 05/05/2023 02/04/21   [provider]  QUEtiapine (SEROQUEL) 25 MG tablet Take 25 mg by mouth 2 (two) times daily as needed. 04/18/23   [provider]  SYMBICORT 160-4.5 MCG/ACT inhaler Inhale 2 puffs into the lungs in the morning and at bedtime. Patient not taking: Reported on 05/05/2023 11/22/22   Marcelyn Bruins, MD  topiramate (TOPAMAX) 50 MG tablet Take 50 mg by mouth 3 (three) times daily. Patient not taking: Reported on 05/05/2023 06/07/22  [provider]  triamcinolone (NASACORT ALLERGY 24HR) 55 MCG/ACT AERO nasal inhaler Place 2 sprays into the nose daily. Patient not taking: Reported on 05/05/2023 06/10/22   Marcelyn Bruins, MD  VENTOLIN HFA 108 [phone]) MCG/ACT inhaler INHALE TWO PUFFS INTO THE LUNGS EVERY 4 HOURS AS NEEDED FOR WHEEZING AND SHORTNESS OF BREATH Patient not taking: Reported on 05/05/2023 12/06/22   Marcelyn Bruins, MD  fluticasone [facility]) 50 MCG/ACT nasal spray Place 2 sprays into both nostrils daily. Patient not taking: Reported  on 08/01/2019 12/15/18 08/01/19  Loren Racer, MD  loratadine (CLARITIN) 10 MG tablet Take 1 tablet (10 mg total) by mouth daily. Patient not taking: Reported on 05/28/2019 12/15/18 08/01/19  Loren Racer, MD      Allergies    Poison ivy extract [poison ivy extract], Sulfa antibiotics, Bee pollen, Fluoxetine, and Molds & smuts    Review of Systems   Review of Systems  Constitutional: Negative.   Respiratory: Negative.      Physical Exam Updated Vital Signs BP (!) 146/125 (BP Location: Left Arm)   Pulse 98   Temp 98 F (36.7 C) (Oral)   Resp 18   Ht 5\' 4"  (1.626 m)   Wt 108 kg   SpO2 100%   BMI 40.87 kg/m  Physical Exam Vitals and nursing note reviewed.  Constitutional:      Appearance: She is well-developed. She is not ill-appearing or diaphoretic.     Comments: Appears in emotional distress, crying, anxious   HENT:     Head: Normocephalic and atraumatic.     Comments: Pt missing several teeth d/t prior dental history, left lower molar appears decaying, gums have very minimal surrounding erythema w/o edema.     Right Ear: Tympanic membrane normal.     Left Ear: Tympanic membrane normal.  Eyes:     Conjunctiva/sclera: Conjunctivae normal.  Cardiovascular:     Rate and Rhythm: Normal rate and regular rhythm.     Heart sounds: No murmur heard. Pulmonary:     Effort: Pulmonary effort is normal. No respiratory distress.     Breath sounds: Normal breath sounds.  Abdominal:     Palpations: Abdomen is soft.     Tenderness: There is no abdominal tenderness.  Musculoskeletal:        General: No swelling.     Cervical back: Neck supple.  Skin:    General: Skin is warm and dry.     Capillary Refill: Capillary refill takes less than 2 seconds.  Neurological:     Mental Status: She is alert.  Psychiatric:        Mood and Affect: Mood normal.     ED Results / Procedures / Treatments   Labs (all labs ordered are listed, but only abnormal results are displayed) Labs  Reviewed - No data to display  EKG None  Radiology No results found.  Procedures Procedures    Medications Ordered in ED Medications  hydrOXYzine (ATARAX) tablet 25 mg (has no administration in time range)  HYDROcodone-acetaminophen (NORCO/VICODIN) 5-325 MG per tablet 1 tablet (has no administration in time range)    ED Course/ Medical Decision Making/ A&P                                 Medical Decision Making Risk Prescription drug management.   This patient presents to the ED for concern of tooth pain, this involves an extensive number of  treatment options, and is a complaint that carries with it a high risk of complications and morbidity.  The differential diagnosis includes dental carries, infection, cellulitis, URI, AOM    Co morbidities that complicate the patient evaluation  Anxiety Autism  DMDD Reports poor f/u w/ dentist.    Additional history obtained:  Additional history obtained from patients caregiver in room w/ pt    Cardiac Monitoring: / EKG:  Vitals monitored, pt does not have fever at presentation     Problem List / ED Course / Critical interventions / Medication management  Pt presenting with left lower tooth pain, given pts vitals, PE findings and pts hx, we will treat her pain w/ OP Nocro and Naproxen and start her on an Antibiotic for tooth infection. She has no signs of significant infection at this time and agrees to f/u w/ dentist for further care.  I ordered medication including Norco  for pain, hydroxyzine for significant anxiety   Reevaluation of the patient after these medicines showed that the patient improved I have reviewed the patients home medicines and have made adjustments as needed    Plan D/c w/ abx - amoxicillin, naproxen and Nocro to manage pain OP. Pt received one norco during stay. Pt to f/u w/ dentist at soonest apt.  Pt agrees to plan of pain control and f/u w/ dentist, educated on sx  Will return to ED if sx  continue or worsen        Final Clinical Impression(s) / ED Diagnoses Final diagnoses:  None    Rx / DC Orders ED Discharge Orders     None         Reinaldo Raddle 05/06/23 1630    Gerhard Munch, MD 05/09/23 [id number]

## 2023-05-06 NOTE — ED Triage Notes (Signed)
Patient presented to triage with dental pain. Patient states she has had dental problems in the past woke up at 3am with extreme pain in left side of mouth. Patient crying in triage, denies fevers.

## 2023-05-06 NOTE — Discharge Instructions (Addendum)
You were seen in the ER today for dental pain.  I recommend using ibuprofen for pain control and using Norco for pain that can't be controlled with ibuprofen. You were prescribed an antibiotic, take antibiotic as prescribed.   It is important to follow up with a dentist as soon as possible.

## 2023-05-07 ENCOUNTER — Telehealth: Payer: Self-pay | Admitting: Student

## 2023-05-07 NOTE — Telephone Encounter (Signed)
Patient called the on-call service again this afternoon with concerns that her drainage was worsening.  She states that the drainage is continuous and she is worried about it.  Recommended to the patient that if she is concerned or feels that things are worsening, she should be evaluated in the emergency room.  Discussed otherwise that she can be reinforced the area with dressings and be evaluated in our clinic on Monday.  Discussed with her that if things do worsen she should go to the emergency room to be evaluated.  Patient expressed understanding.  Also discussed case with Dr. Ladona Ridgel.

## 2023-05-07 NOTE — Telephone Encounter (Signed)
Patient called the on-call service with concerns about her breast wound.  She was seen by Dr. Ladona Ridgel about 2 days ago.  She states that she has been dealing with this breast wound for about 6 months.  She states that the drainage from the breast wound throughout the 54-month period has been a dark red color, but she states that today she noticed a small hole and she states the drainage looks more orange today.  She denies any fevers or chills.  She denies any worsening pain.  She states that she is currently on amoxicillin for a possible tooth infection.  I discussed with the patient that I would like her to continue to monitor her breast.  I discussed with her I do like her to place gauze, ABD pads or maxi pad over where the drainage is and change as needed for saturation or change daily.  I would like her to come to the clinic on Monday to be evaluated.  I discussed with her that if she notices any worsening redness, pain, change in the drainage, fevers, chills or any worsening symptoms, to call us back before Monday.  Patient expressed understanding and was in agreement with the plan.

## 2023-05-10 ENCOUNTER — Telehealth: Payer: Self-pay | Admitting: Plastic Surgery

## 2023-05-10 ENCOUNTER — Ambulatory Visit
Admission: RE | Admit: 2023-05-10 | Discharge: 2023-05-10 | Disposition: A | Discharge status: Home / Self Care | Payer: Medicare Other | Source: Ambulatory Visit | Attending: Plastic Surgery | Admitting: Plastic Surgery

## 2023-05-10 DIAGNOSIS — L989 Disorder of the skin and subcutaneous tissue, unspecified: Secondary | ICD-10-CM

## 2023-05-10 DIAGNOSIS — N6011 Diffuse cystic mastopathy of right breast: Secondary | ICD-10-CM

## 2023-05-10 DIAGNOSIS — N611 Abscess of the breast and nipple: Secondary | ICD-10-CM

## 2023-05-10 NOTE — Telephone Encounter (Signed)
Patient stopped in after her Ultrasound upstairs and just wanted to let you know that they found 2 fluid pockets.  They will be sending you the report.

## 2023-05-10 NOTE — Telephone Encounter (Signed)
I called patient to try and schedule this week with either Dr Ladona Ridgel or with a PA on a day that Dr Ladona Ridgel is here. Patient asked me to hold on and got her mother on the phone and she told patient that she needed to schedule in 3 to 4 days because she had to let transportation know at least 3 days ahead, So the first day she could do would be Friday but Dr Ladona Ridgel isnt here. I offered Tuesday 05/17/23 which is Dr Bruna Potter next available with transportation delays and she said that works. I did tell her that per provider if she feels like she is getting worse or can't wait that long then she needs to go to the ED.

## 2023-05-11 NOTE — Telephone Encounter (Signed)
Patient called to follow up to make sure we received [place] results and she wanted to know if we know how long it will be before she can get scheduled for surgery

## 2023-05-17 ENCOUNTER — Ambulatory Visit (INDEPENDENT_AMBULATORY_CARE_PROVIDER_SITE_OTHER): Payer: Medicare Other | Admitting: Physician Assistant

## 2023-05-17 ENCOUNTER — Encounter: Payer: Self-pay | Admitting: Physician Assistant

## 2023-05-17 ENCOUNTER — Ambulatory Visit: Payer: Medicare Other | Admitting: Plastic Surgery

## 2023-05-17 VITALS — BP 124/85 | HR 83 | Ht 63.5 in | Wt 236.0 lb

## 2023-05-17 DIAGNOSIS — L989 Disorder of the skin and subcutaneous tissue, unspecified: Secondary | ICD-10-CM

## 2023-05-17 MED ORDER — ONDANSETRON 4 MG PO TBDP: 4.0000 mg | ORAL_TABLET | Freq: Three times a day (TID) | ORAL | 0 refills | Status: DC | PRN

## 2023-05-17 MED ORDER — OXYCODONE HCL 5 MG PO TABS: 5.0000 mg | ORAL_TABLET | Freq: Four times a day (QID) | ORAL | 0 refills | Status: AC | PRN

## 2023-05-17 NOTE — H&P (View-Only) (Signed)
Patient ID: Amanda [name], female    DOB: 09/19/2001, 22 y.o.   MRN: [id number]  Chief Complaint  Patient presents with   Pre-op Exam      ICD-10-CM   1. Skin lesion  L98.9        History of Present Illness: Amanda [name] is a 22 y.o.  female  with a history of left breast lesion.  She presents for preoperative evaluation for upcoming procedure, excision of left breast lesion, scheduled for 05/27/2023 with Dr.  Ladona Ridgel .  The patient has not had problems with anesthesia.  Previous upper endoscopy without complication or difficulty.  She reports that her father may have had history of DVT, but stepfather at bedside states that he is unreliable as a historian.  No other personal or family history of blood clots or clotting disorder.  She takes medication for her anxiety and bipolar 1 disorder, but no other medications chronically.  However, she was recently seen in the ER for dental pain and prescribed antibiotics and narcotic analgesics which she is still taking.  Patient reports that she has almost finished her course of narcotics.  She adamantly denies any associated fevers and reports that they were not particular concerned about infection, but rather necrosis.  She plans to follow-up with a dentist for consideration of tooth extraction in October.  She reports marijuana use recreationally, but denies use of any nicotine containing products.  Denies any personal history of cancer, severe cardiac or pulmonary disease, or use of anticoagulation.  She reports that she saw a cardiologist in the past for what she describes as an innocent heart murmur.  Lastly, she states that she has had similar fluid collection lesions in the past including one in her axilla.  Unclear about possible history of hidradenitis suppurativa.  Discussed risks of surgery as well as the possibility for recurrence. Patient then messaged the office after her encounter reporting significant skin sensitivity to medical  adhesives.   Summary of Previous Visit: She was seen for consult by Dr. Ladona Ridgel on 05/05/2023.  At that time, complained of a draining wound on the posterior aspect of her left breast times several months that has failed medical management with antibiotics.  Endorses history of MRSA infection.  5 x 2 cm superficial wound noted on the posterior aspect of left breast.  Nondraining on exam.  Per exam, appeared consistent with superficial abscess and discussed OR excision.  Patient was also ordered to obtain breast imaging.  US obtained 05/10/2023 revealed 2 small collections within the skin and inferior left breast at 5 to 6 o'clock position representing resolving skin abscesses.  No mass or deeper collection along adjacent tissue.  Begin annual screening mammogram at age 67.  BI-RADS Category 2: Benign.  Job: Not currently employed.  PMH Significant for: Left breast skin lesion, bipolar 1 disorder, ADHD, PTSD, ODD, ASD.   Past Medical History: Allergies: Allergies  Allergen Reactions   Poison Ivy Extract [Poison Ivy Extract] Hives, Itching and Rash   Sulfa Antibiotics Itching, Swelling, Rash and Other (See Comments)    Unknown   Bee Pollen    Fluoxetine Hives   Molds & Smuts     Current Medications:  Current Outpatient Medications:    ondansetron (ZOFRAN-ODT) 4 MG disintegrating tablet, Take 1 tablet (4 mg total) by mouth every 8 (eight) hours as needed for nausea or vomiting., Disp: 20 tablet, Rfl: 0   oxyCODONE (ROXICODONE) 5 MG immediate release tablet, Take 1 tablet (5  mg total) by mouth every 6 (six) hours as needed for up to 5 days for severe pain., Disp: 20 tablet, Rfl: 0   ABILIFY MAINTENA 400 MG SRER injection, Inject 1 mL (200 mg total) into the muscle every 30 (thirty) days. (Due on 06-27-20): For mood control, Disp: 1 each, Rfl: 0   amoxicillin (AMOXIL) 500 MG capsule, Take 1 capsule (500 mg total) by mouth 3 (three) times daily., Disp: 21 capsule, Rfl: 0   ARIPiprazole (ABILIFY)  10 MG tablet, Take 10 mg by mouth daily., Disp: , Rfl:    atomoxetine (STRATTERA) 10 MG capsule, Take 10 mg by mouth daily. (Patient not taking: Reported on 05/05/2023), Disp: , Rfl:    azelastine (ASTELIN) 0.1 % nasal spray, Place 2 sprays into both nostrils 2 (two) times daily. Use in each nostril as directed (Patient not taking: Reported on 05/05/2023), Disp: 30 mL, Rfl: 5   desvenlafaxine (PRISTIQ) 50 MG 24 hr tablet, , Disp: , Rfl:    diphenhydrAMINE (BENADRYL) 25 MG tablet, Take 1 tablet (25 mg total) by mouth every 6 (six) hours as needed. (Patient not taking: Reported on 05/05/2023), Disp: 30 tablet, Rfl: 0   EPINEPHrine 0.3 mg/0.3 mL IJ SOAJ injection, Inject 0.3 mg into the muscle as needed for anaphylaxis. (Patient not taking: Reported on 05/05/2023), Disp: 1 each, Rfl: 0   famotidine (PEPCID) 20 MG tablet, Take 1 tablet (20 mg total) by mouth daily for 5 days., Disp: 5 tablet, Rfl: 0   famotidine (PEPCID) 20 MG tablet, , Disp: , Rfl:    HYDROcodone-acetaminophen (NORCO) 5-325 MG tablet, Take 1 tablet by mouth every 4 (four) hours as needed for severe pain., Disp: 10 tablet, Rfl: 0   hydrOXYzine (VISTARIL) 25 MG capsule, Take 25 mg by mouth 3 (three) times daily. (Patient not taking: Reported on 05/05/2023), Disp: , Rfl:    levocetirizine (XYZAL) 5 MG tablet, Take 1 tablet (5 mg total) by mouth every evening. (Patient not taking: Reported on 05/05/2023), Disp: 30 tablet, Rfl: 5   METRONIDAZOLE PO, Take 500 mg by mouth daily. 14 tablets (Patient not taking: Reported on 05/05/2023), Disp: , Rfl:    naproxen (NAPROSYN) 375 MG tablet, Take 1 tablet (375 mg total) by mouth 2 (two) times daily with a meal., Disp: 20 tablet, Rfl: 0   Olopatadine HCl 0.2 % SOLN, Apply 1 drop to eye daily. (Patient not taking: Reported on 05/05/2023), Disp: 2.5 mL, Rfl: 5   ondansetron (ZOFRAN) 4 MG tablet, Take 2 tablets every 8 hours by oral route as needed. (Patient not taking: Reported on 05/05/2023), Disp: , Rfl:     QUEtiapine (SEROQUEL) 25 MG tablet, Take 25 mg by mouth 2 (two) times daily as needed., Disp: , Rfl:    SYMBICORT 160-4.5 MCG/ACT inhaler, Inhale 2 puffs into the lungs in the morning and at bedtime. (Patient not taking: Reported on 05/05/2023), Disp: 10.2 g, Rfl: 5   topiramate (TOPAMAX) 50 MG tablet, Take 50 mg by mouth 3 (three) times daily. (Patient not taking: Reported on 05/05/2023), Disp: , Rfl:    triamcinolone (NASACORT ALLERGY 24HR) 55 MCG/ACT AERO nasal inhaler, Place 2 sprays into the nose daily. (Patient not taking: Reported on 05/05/2023), Disp: 1 each, Rfl: 5   VENTOLIN HFA 108 (90 Base) MCG/ACT inhaler, INHALE TWO PUFFS INTO THE LUNGS EVERY 4 HOURS AS NEEDED FOR WHEEZING AND SHORTNESS OF BREATH (Patient not taking: Reported on 05/05/2023), Disp: 18 g, Rfl: 0  Past Medical Problems: Past Medical History:  Diagnosis Date   Asthma    Attention deficit hyperactivity disorder    Depression    Eczema    Mood disorder (HCC)    Oppositional defiant disorder     Past Surgical History: Past Surgical History:  Procedure Laterality Date   ADENOIDECTOMY     TYMPANOSTOMY TUBE PLACEMENT      Social History: Social History   Socioeconomic History   Marital status: Single    Spouse name: Not on file   Number of children: Not on file   Years of education: Not on file   Highest education level: Not on file  Occupational History   Not on file  Tobacco Use   Smoking status: Never    Passive exposure: Current   Smokeless tobacco: Never  Vaping Use   Vaping status: Every Day   Substances: Nicotine, Flavoring  Substance and Sexual Activity   Alcohol use: No   Drug use: Yes    Types: Marijuana   Sexual activity: Yes    Birth control/protection: Condom, I.U.D.  Other Topics Concern   Not on file  Social History Narrative   Not on file   Social Determinants of Health   Financial Resource Strain: Not on file  Food Insecurity: Not on file  Transportation Needs: Not on file   Physical Activity: Not on file  Stress: Not on file  Social Connections: Unknown (01/31/2022)   Received from [facility], [facility]   Social Network    Social Network: Not on file  Intimate Partner Violence: Unknown (12/23/2021)   [facility], [facility]   HITS    Physically Hurt: Not on file    Insult or Talk Down To: Not on file    Threaten Physical Harm: Not on file    Scream or Curse: Not on file    Family History: Family History  Problem Relation Age of Onset   Food Allergy Sister     Review of Systems: ROS Denies any fevers or chest pain.  Physical Exam: Vital Signs BP 124/85 (BP Location: Right Arm, Patient Position: Sitting, Cuff Size: Large)   Pulse 83   Ht 5' 3.5" (1.613 m)   Wt 236 lb (107 kg)   SpO2 98%   BMI 41.15 kg/m   Physical Exam Constitutional:      General: Not in acute distress.    Appearance: Normal appearance. Not ill-appearing.  HENT:     Head: Normocephalic and atraumatic.  Eyes:     Pupils: Pupils are equal, round. Cardiovascular:     Rate and Rhythm: Normal rate.    Pulses: Normal pulses.  Pulmonary:     Effort: No respiratory distress or increased work of breathing.  Speaks in full sentences. Abdominal:     General: Abdomen is flat. No distension.   Musculoskeletal: Normal range of motion. No lower extremity swelling or edema. No varicosities. Skin:    General: Skin is warm and dry.     Findings: No erythema or rash.  Neurological:     Mental Status: Alert and oriented to person, place, and time.  Psychiatric:        Mood and Affect: Mood normal.        Behavior: Behavior normal.    Assessment/Plan: The patient is scheduled for excision of left breast lesion with Dr.  Ladona Ridgel .  Risks, benefits, and alternatives of procedure discussed, questions answered and consent obtained.    Smoking Status: Recreational marijuana smoker.  No nicotine use. Last  Mammogram: [place] left breast and axilla 04/2023; Results:  BI-RADS Category 2: Resolving superficial abscesses.  Caprini Score: 5; Risk Factors include: BMI greater than 40, questionable family history of thrombosis, and length of planned surgery. Recommendation for mechanical prophylaxis. Encourage early ambulation.   Pictures obtained: 05/05/2023  Post-op Rx sent to pharmacy: Oxycodone (#5) and Zofran.  Patient was provided with the General Surgical Risk consent document and Pain Medication Agreement prior to their appointment.  They had adequate time to read through the risk consent documents and Pain Medication Agreement. We also discussed them in person together during this preop appointment. All of their questions were answered to their satisfaction.  Recommended calling if they have any further questions.  Risk consent form and Pain Medication Agreement to be scanned into patient's chart.    Electronically signed by: Evelena Leyden, PA-C 05/17/2023 1:52 PM

## 2023-05-17 NOTE — Progress Notes (Addendum)
Patient ID: Amanda [name], female    DOB: 09/19/2001, 22 y.o.   MRN: [id number]  Chief Complaint  Patient presents with   Pre-op Exam      ICD-10-CM   1. Skin lesion  L98.9        History of Present Illness: Amanda [name] is a 22 y.o.  female  with a history of left breast lesion.  She presents for preoperative evaluation for upcoming procedure, excision of left breast lesion, scheduled for 05/27/2023 with Dr.  Ladona Ridgel .  The patient has not had problems with anesthesia.  Previous upper endoscopy without complication or difficulty.  She reports that her father may have had history of DVT, but stepfather at bedside states that he is unreliable as a historian.  No other personal or family history of blood clots or clotting disorder.  She takes medication for her anxiety and bipolar 1 disorder, but no other medications chronically.  However, she was recently seen in the ER for dental pain and prescribed antibiotics and narcotic analgesics which she is still taking.  Patient reports that she has almost finished her course of narcotics.  She adamantly denies any associated fevers and reports that they were not particular concerned about infection, but rather necrosis.  She plans to follow-up with a dentist for consideration of tooth extraction in October.  She reports marijuana use recreationally, but denies use of any nicotine containing products.  Denies any personal history of cancer, severe cardiac or pulmonary disease, or use of anticoagulation.  She reports that she saw a cardiologist in the past for what she describes as an innocent heart murmur.  Lastly, she states that she has had similar fluid collection lesions in the past including one in her axilla.  Unclear about possible history of hidradenitis suppurativa.  Discussed risks of surgery as well as the possibility for recurrence. Patient then messaged the office after her encounter reporting significant skin sensitivity to medical  adhesives.   Summary of Previous Visit: She was seen for consult by Dr. Ladona Ridgel on 05/05/2023.  At that time, complained of a draining wound on the posterior aspect of her left breast times several months that has failed medical management with antibiotics.  Endorses history of MRSA infection.  5 x 2 cm superficial wound noted on the posterior aspect of left breast.  Nondraining on exam.  Per exam, appeared consistent with superficial abscess and discussed OR excision.  Patient was also ordered to obtain breast imaging.  US obtained 05/10/2023 revealed 2 small collections within the skin and inferior left breast at 5 to 6 o'clock position representing resolving skin abscesses.  No mass or deeper collection along adjacent tissue.  Begin annual screening mammogram at age 67.  BI-RADS Category 2: Benign.  Job: Not currently employed.  PMH Significant for: Left breast skin lesion, bipolar 1 disorder, ADHD, PTSD, ODD, ASD.   Past Medical History: Allergies: Allergies  Allergen Reactions   Poison Ivy Extract [Poison Ivy Extract] Hives, Itching and Rash   Sulfa Antibiotics Itching, Swelling, Rash and Other (See Comments)    Unknown   Bee Pollen    Fluoxetine Hives   Molds & Smuts     Current Medications:  Current Outpatient Medications:    ondansetron (ZOFRAN-ODT) 4 MG disintegrating tablet, Take 1 tablet (4 mg total) by mouth every 8 (eight) hours as needed for nausea or vomiting., Disp: 20 tablet, Rfl: 0   oxyCODONE (ROXICODONE) 5 MG immediate release tablet, Take 1 tablet (5  mg total) by mouth every 6 (six) hours as needed for up to 5 days for severe pain., Disp: 20 tablet, Rfl: 0   ABILIFY MAINTENA 400 MG SRER injection, Inject 1 mL (200 mg total) into the muscle every 30 (thirty) days. (Due on 06-27-20): For mood control, Disp: 1 each, Rfl: 0   amoxicillin (AMOXIL) 500 MG capsule, Take 1 capsule (500 mg total) by mouth 3 (three) times daily., Disp: 21 capsule, Rfl: 0   ARIPiprazole (ABILIFY)  10 MG tablet, Take 10 mg by mouth daily., Disp: , Rfl:    atomoxetine (STRATTERA) 10 MG capsule, Take 10 mg by mouth daily. (Patient not taking: Reported on 05/05/2023), Disp: , Rfl:    azelastine (ASTELIN) 0.1 % nasal spray, Place 2 sprays into both nostrils 2 (two) times daily. Use in each nostril as directed (Patient not taking: Reported on 05/05/2023), Disp: 30 mL, Rfl: 5   desvenlafaxine (PRISTIQ) 50 MG 24 hr tablet, , Disp: , Rfl:    diphenhydrAMINE (BENADRYL) 25 MG tablet, Take 1 tablet (25 mg total) by mouth every 6 (six) hours as needed. (Patient not taking: Reported on 05/05/2023), Disp: 30 tablet, Rfl: 0   EPINEPHrine 0.3 mg/0.3 mL IJ SOAJ injection, Inject 0.3 mg into the muscle as needed for anaphylaxis. (Patient not taking: Reported on 05/05/2023), Disp: 1 each, Rfl: 0   famotidine (PEPCID) 20 MG tablet, Take 1 tablet (20 mg total) by mouth daily for 5 days., Disp: 5 tablet, Rfl: 0   famotidine (PEPCID) 20 MG tablet, , Disp: , Rfl:    HYDROcodone-acetaminophen (NORCO) 5-325 MG tablet, Take 1 tablet by mouth every 4 (four) hours as needed for severe pain., Disp: 10 tablet, Rfl: 0   hydrOXYzine (VISTARIL) 25 MG capsule, Take 25 mg by mouth 3 (three) times daily. (Patient not taking: Reported on 05/05/2023), Disp: , Rfl:    levocetirizine (XYZAL) 5 MG tablet, Take 1 tablet (5 mg total) by mouth every evening. (Patient not taking: Reported on 05/05/2023), Disp: 30 tablet, Rfl: 5   METRONIDAZOLE PO, Take 500 mg by mouth daily. 14 tablets (Patient not taking: Reported on 05/05/2023), Disp: , Rfl:    naproxen (NAPROSYN) 375 MG tablet, Take 1 tablet (375 mg total) by mouth 2 (two) times daily with a meal., Disp: 20 tablet, Rfl: 0   Olopatadine HCl 0.2 % SOLN, Apply 1 drop to eye daily. (Patient not taking: Reported on 05/05/2023), Disp: 2.5 mL, Rfl: 5   ondansetron (ZOFRAN) 4 MG tablet, Take 2 tablets every 8 hours by oral route as needed. (Patient not taking: Reported on 05/05/2023), Disp: , Rfl:     QUEtiapine (SEROQUEL) 25 MG tablet, Take 25 mg by mouth 2 (two) times daily as needed., Disp: , Rfl:    SYMBICORT 160-4.5 MCG/ACT inhaler, Inhale 2 puffs into the lungs in the morning and at bedtime. (Patient not taking: Reported on 05/05/2023), Disp: 10.2 g, Rfl: 5   topiramate (TOPAMAX) 50 MG tablet, Take 50 mg by mouth 3 (three) times daily. (Patient not taking: Reported on 05/05/2023), Disp: , Rfl:    triamcinolone (NASACORT ALLERGY 24HR) 55 MCG/ACT AERO nasal inhaler, Place 2 sprays into the nose daily. (Patient not taking: Reported on 05/05/2023), Disp: 1 each, Rfl: 5   VENTOLIN HFA 108 (90 Base) MCG/ACT inhaler, INHALE TWO PUFFS INTO THE LUNGS EVERY 4 HOURS AS NEEDED FOR WHEEZING AND SHORTNESS OF BREATH (Patient not taking: Reported on 05/05/2023), Disp: 18 g, Rfl: 0  Past Medical Problems: Past Medical History:  Diagnosis Date   Asthma    Attention deficit hyperactivity disorder    Depression    Eczema    Mood disorder (HCC)    Oppositional defiant disorder     Past Surgical History: Past Surgical History:  Procedure Laterality Date   ADENOIDECTOMY     TYMPANOSTOMY TUBE PLACEMENT      Social History: Social History   Socioeconomic History   Marital status: Single    Spouse name: Not on file   Number of children: Not on file   Years of education: Not on file   Highest education level: Not on file  Occupational History   Not on file  Tobacco Use   Smoking status: Never    Passive exposure: Current   Smokeless tobacco: Never  Vaping Use   Vaping status: Every Day   Substances: Nicotine, Flavoring  Substance and Sexual Activity   Alcohol use: No   Drug use: Yes    Types: Marijuana   Sexual activity: Yes    Birth control/protection: Condom, I.U.D.  Other Topics Concern   Not on file  Social History Narrative   Not on file   Social Determinants of Health   Financial Resource Strain: Not on file  Food Insecurity: Not on file  Transportation Needs: Not on file   Physical Activity: Not on file  Stress: Not on file  Social Connections: Unknown (01/31/2022)   Received from [facility], [facility]   Social Network    Social Network: Not on file  Intimate Partner Violence: Unknown (12/23/2021)   [facility], [facility]   HITS    Physically Hurt: Not on file    Insult or Talk Down To: Not on file    Threaten Physical Harm: Not on file    Scream or Curse: Not on file    Family History: Family History  Problem Relation Age of Onset   Food Allergy Sister     Review of Systems: ROS Denies any fevers or chest pain.  Physical Exam: Vital Signs BP 124/85 (BP Location: Right Arm, Patient Position: Sitting, Cuff Size: Large)   Pulse 83   Ht 5' 3.5" (1.613 m)   Wt 236 lb (107 kg)   SpO2 98%   BMI 41.15 kg/m   Physical Exam Constitutional:      General: Not in acute distress.    Appearance: Normal appearance. Not ill-appearing.  HENT:     Head: Normocephalic and atraumatic.  Eyes:     Pupils: Pupils are equal, round. Cardiovascular:     Rate and Rhythm: Normal rate.    Pulses: Normal pulses.  Pulmonary:     Effort: No respiratory distress or increased work of breathing.  Speaks in full sentences. Abdominal:     General: Abdomen is flat. No distension.   Musculoskeletal: Normal range of motion. No lower extremity swelling or edema. No varicosities. Skin:    General: Skin is warm and dry.     Findings: No erythema or rash.  Neurological:     Mental Status: Alert and oriented to person, place, and time.  Psychiatric:        Mood and Affect: Mood normal.        Behavior: Behavior normal.    Assessment/Plan: The patient is scheduled for excision of left breast lesion with Dr.  Ladona Ridgel .  Risks, benefits, and alternatives of procedure discussed, questions answered and consent obtained.    Smoking Status: Recreational marijuana smoker.  No nicotine use. Last  Mammogram: [place] left breast and axilla 04/2023; Results:  BI-RADS Category 2: Resolving superficial abscesses.  Caprini Score: 5; Risk Factors include: BMI greater than 40, questionable family history of thrombosis, and length of planned surgery. Recommendation for mechanical prophylaxis. Encourage early ambulation.   Pictures obtained: 05/05/2023  Post-op Rx sent to pharmacy: Oxycodone (#5) and Zofran.  Patient was provided with the General Surgical Risk consent document and Pain Medication Agreement prior to their appointment.  They had adequate time to read through the risk consent documents and Pain Medication Agreement. We also discussed them in person together during this preop appointment. All of their questions were answered to their satisfaction.  Recommended calling if they have any further questions.  Risk consent form and Pain Medication Agreement to be scanned into patient's chart.    Electronically signed by: Evelena Leyden, PA-C 05/17/2023 1:52 PM

## 2023-05-18 ENCOUNTER — Telehealth: Payer: Self-pay | Admitting: Plastic Surgery

## 2023-05-18 ENCOUNTER — Encounter: Payer: Self-pay | Admitting: Surgical

## 2023-05-18 NOTE — Telephone Encounter (Signed)
OK thank you, please advise patient I will addend my note to reflect that allergy. Please have her mention this on the day of surgery, as well.

## 2023-05-18 NOTE — Telephone Encounter (Signed)
Patient called and forgot to mention yesterday that she is allergic to medical adhesive.  She gets a bad rash if it is used. Please make a note in her chart.   If you have any questions, she can be reached at [phone]

## 2023-05-18 NOTE — Telephone Encounter (Signed)
Noted  

## 2023-05-19 ENCOUNTER — Other Ambulatory Visit: Payer: Self-pay

## 2023-05-19 ENCOUNTER — Encounter (HOSPITAL_BASED_OUTPATIENT_CLINIC_OR_DEPARTMENT_OTHER): Payer: Self-pay | Admitting: Plastic Surgery

## 2023-05-19 NOTE — Telephone Encounter (Signed)
Ok. I called the patient and relayed the message. Thank you.

## 2023-05-20 MED ORDER — CHLORHEXIDINE GLUCONATE CLOTH 2 % EX PADS: 6.0000 | MEDICATED_PAD | Freq: Once | CUTANEOUS | Status: DC

## 2023-05-20 NOTE — Progress Notes (Signed)

## 2023-05-27 ENCOUNTER — Ambulatory Visit (HOSPITAL_BASED_OUTPATIENT_CLINIC_OR_DEPARTMENT_OTHER): Payer: Medicare Other | Admitting: Certified Registered"

## 2023-05-27 ENCOUNTER — Encounter (HOSPITAL_BASED_OUTPATIENT_CLINIC_OR_DEPARTMENT_OTHER): Payer: Self-pay | Admitting: Plastic Surgery

## 2023-05-27 ENCOUNTER — Ambulatory Visit (HOSPITAL_BASED_OUTPATIENT_CLINIC_OR_DEPARTMENT_OTHER)
Admission: RE | Admit: 2023-05-27 | Discharge: 2023-05-27 | Disposition: A | Discharge status: Home / Self Care | Payer: Medicare Other | Attending: Plastic Surgery | Admitting: Plastic Surgery

## 2023-05-27 ENCOUNTER — Other Ambulatory Visit: Payer: Self-pay

## 2023-05-27 ENCOUNTER — Encounter (HOSPITAL_BASED_OUTPATIENT_CLINIC_OR_DEPARTMENT_OTHER)
Admission: RE | Disposition: A | Discharge status: Home / Self Care | Payer: Self-pay | Source: Home / Self Care | Attending: Plastic Surgery

## 2023-05-27 DIAGNOSIS — N6082 Other benign mammary dysplasias of left breast: Secondary | ICD-10-CM | POA: Insufficient documentation

## 2023-05-27 DIAGNOSIS — F1290 Cannabis use, unspecified, uncomplicated: Secondary | ICD-10-CM | POA: Diagnosis not present

## 2023-05-27 DIAGNOSIS — F4310 Post-traumatic stress disorder, unspecified: Secondary | ICD-10-CM | POA: Insufficient documentation

## 2023-05-27 DIAGNOSIS — F419 Anxiety disorder, unspecified: Secondary | ICD-10-CM | POA: Diagnosis not present

## 2023-05-27 DIAGNOSIS — Z8614 Personal history of Methicillin resistant Staphylococcus aureus infection: Secondary | ICD-10-CM | POA: Insufficient documentation

## 2023-05-27 DIAGNOSIS — Z01818 Encounter for other preprocedural examination: Secondary | ICD-10-CM

## 2023-05-27 DIAGNOSIS — L720 Epidermal cyst: Secondary | ICD-10-CM | POA: Diagnosis not present

## 2023-05-27 DIAGNOSIS — K0889 Other specified disorders of teeth and supporting structures: Secondary | ICD-10-CM | POA: Diagnosis not present

## 2023-05-27 DIAGNOSIS — F913 Oppositional defiant disorder: Secondary | ICD-10-CM | POA: Diagnosis not present

## 2023-05-27 DIAGNOSIS — N6002 Solitary cyst of left breast: Secondary | ICD-10-CM

## 2023-05-27 DIAGNOSIS — F909 Attention-deficit hyperactivity disorder, unspecified type: Secondary | ICD-10-CM | POA: Diagnosis not present

## 2023-05-27 DIAGNOSIS — F319 Bipolar disorder, unspecified: Secondary | ICD-10-CM | POA: Diagnosis not present

## 2023-05-27 HISTORY — PX: BREAST CYST EXCISION: SHX579

## 2023-05-27 HISTORY — DX: Autistic disorder: F84.0

## 2023-05-27 HISTORY — DX: Cardiac murmur, unspecified: R01.1

## 2023-05-27 LAB — POCT PREGNANCY, URINE: Preg Test, Ur: NEGATIVE

## 2023-05-27 SURGERY — EXCISION, CYST, BREAST
Anesthesia: General | Site: Breast | Laterality: Left

## 2023-05-27 MED ORDER — ONDANSETRON HCL 4 MG/2ML IJ SOLN: 4.0000 mg | Freq: Four times a day (QID) | INTRAMUSCULAR | Status: DC | PRN

## 2023-05-27 MED ORDER — FENTANYL CITRATE (PF) 100 MCG/2ML IJ SOLN
INTRAMUSCULAR | Status: AC
  Filled 2023-05-27: qty 2

## 2023-05-27 MED ORDER — LIDOCAINE-EPINEPHRINE 1 %-1:100000 IJ SOLN
INTRAMUSCULAR | Status: AC
  Filled 2023-05-27: qty 1

## 2023-05-27 MED ORDER — BUPIVACAINE-EPINEPHRINE (PF) 0.25% -1:200000 IJ SOLN
INTRAMUSCULAR | Status: AC
  Filled 2023-05-27: qty 30

## 2023-05-27 MED ORDER — PROPOFOL 10 MG/ML IV BOLUS
INTRAVENOUS | Status: AC
  Filled 2023-05-27: qty 20

## 2023-05-27 MED ORDER — CEFAZOLIN SODIUM-DEXTROSE 2-3 GM-%(50ML) IV SOLR
INTRAVENOUS | Status: DC | PRN
  Administered 2023-05-27: 2 g via INTRAVENOUS

## 2023-05-27 MED ORDER — OXYCODONE HCL 5 MG PO TABS: 5.0000 mg | ORAL_TABLET | Freq: Once | ORAL | Status: DC | PRN

## 2023-05-27 MED ORDER — ONDANSETRON HCL 4 MG/2ML IJ SOLN
INTRAMUSCULAR | Status: DC | PRN
  Administered 2023-05-27: 4 mg via INTRAVENOUS

## 2023-05-27 MED ORDER — ONDANSETRON HCL 4 MG/2ML IJ SOLN
INTRAMUSCULAR | Status: AC
  Filled 2023-05-27: qty 2

## 2023-05-27 MED ORDER — DEXAMETHASONE SODIUM PHOSPHATE 10 MG/ML IJ SOLN
INTRAMUSCULAR | Status: DC | PRN
  Administered 2023-05-27: 10 mg via INTRAVENOUS

## 2023-05-27 MED ORDER — LACTATED RINGERS IV SOLN: INTRAVENOUS | Status: DC

## 2023-05-27 MED ORDER — OXYCODONE HCL 5 MG/5ML PO SOLN: 5.0000 mg | Freq: Once | ORAL | Status: DC | PRN

## 2023-05-27 MED ORDER — MIDAZOLAM HCL 2 MG/2ML IJ SOLN
INTRAMUSCULAR | Status: AC
  Filled 2023-05-27: qty 2

## 2023-05-27 MED ORDER — DEXMEDETOMIDINE HCL IN NACL 80 MCG/20ML IV SOLN
INTRAVENOUS | Status: DC | PRN
Start: 2023-05-27 — End: 2023-05-27
  Administered 2023-05-27: 12 ug via INTRAVENOUS

## 2023-05-27 MED ORDER — FENTANYL CITRATE (PF) 100 MCG/2ML IJ SOLN: 25.0000 ug | INTRAMUSCULAR | Status: DC | PRN

## 2023-05-27 MED ORDER — LACTATED RINGERS IV SOLN: INTRAVENOUS | Status: DC | PRN

## 2023-05-27 MED ORDER — BUPIVACAINE-EPINEPHRINE 0.25% -1:200000 IJ SOLN
INTRAMUSCULAR | Status: DC | PRN
  Administered 2023-05-27: 20 mL

## 2023-05-27 MED ORDER — DEXAMETHASONE SODIUM PHOSPHATE 10 MG/ML IJ SOLN
INTRAMUSCULAR | Status: AC
  Filled 2023-05-27: qty 1

## 2023-05-27 MED ORDER — EPHEDRINE SULFATE (PRESSORS) 50 MG/ML IJ SOLN
INTRAMUSCULAR | Status: DC | PRN
  Administered 2023-05-27: 10 mg via INTRAVENOUS

## 2023-05-27 MED ORDER — FENTANYL CITRATE (PF) 100 MCG/2ML IJ SOLN
INTRAMUSCULAR | Status: DC | PRN
  Administered 2023-05-27 (×2): 50 ug via INTRAVENOUS
  Administered 2023-05-27: 100 ug via INTRAVENOUS

## 2023-05-27 MED ORDER — DEXMEDETOMIDINE HCL IN NACL 80 MCG/20ML IV SOLN
INTRAVENOUS | Status: AC
  Filled 2023-05-27: qty 20

## 2023-05-27 MED ORDER — PROPOFOL 10 MG/ML IV BOLUS
INTRAVENOUS | Status: DC | PRN
  Administered 2023-05-27: 250 mg via INTRAVENOUS

## 2023-05-27 MED ORDER — LIDOCAINE HCL (CARDIAC) PF 100 MG/5ML IV SOSY
PREFILLED_SYRINGE | INTRAVENOUS | Status: DC | PRN
  Administered 2023-05-27: 80 mg via INTRAVENOUS

## 2023-05-27 MED ORDER — MIDAZOLAM HCL 2 MG/2ML IJ SOLN
INTRAMUSCULAR | Status: DC | PRN
  Administered 2023-05-27: 2 mg via INTRAVENOUS

## 2023-05-27 MED ORDER — LIDOCAINE 2% (20 MG/ML) 5 ML SYRINGE
INTRAMUSCULAR | Status: AC
  Filled 2023-05-27: qty 5

## 2023-05-27 SURGICAL SUPPLY — 86 items
ADH SKN CLS APL DERMABOND .7 (GAUZE/BANDAGES/DRESSINGS)
APL PRP STRL LF DISP 70% ISPRP (MISCELLANEOUS)
APL SKNCLS STERI-STRIP NONHPOA (GAUZE/BANDAGES/DRESSINGS)
BAND INSRT 18 STRL LF DISP RB (MISCELLANEOUS)
BAND RUBBER #18 3X1/16 STRL (MISCELLANEOUS) IMPLANT
BENZOIN TINCTURE PRP APPL 2/3 (GAUZE/BANDAGES/DRESSINGS) IMPLANT
BINDER BREAST XXLRG (GAUZE/BANDAGES/DRESSINGS) IMPLANT
BLADE CLIPPER SURG (BLADE) IMPLANT
BLADE SURG 10 STRL SS (BLADE) IMPLANT
BLADE SURG 15 STRL LF DISP TIS (BLADE) ×1 IMPLANT
BLADE SURG 15 STRL SS (BLADE) ×1
CANISTER SUCT 1200ML W/VALVE (MISCELLANEOUS) IMPLANT
CHLORAPREP W/TINT 26 (MISCELLANEOUS) ×1 IMPLANT
COVER BACK TABLE 60X90IN (DRAPES) ×1 IMPLANT
COVER MAYO STAND STRL (DRAPES) ×1 IMPLANT
DERMABOND ADVANCED .7 DNX12 (GAUZE/BANDAGES/DRESSINGS) IMPLANT
DRAIN JP 10F RND SILICONE (MISCELLANEOUS) IMPLANT
DRAIN RELI 100 BL SUC LF ST (DRAIN)
DRAPE LAPAROTOMY 100X72 PEDS (DRAPES) IMPLANT
DRAPE U-SHAPE 76X120 STRL (DRAPES) IMPLANT
DRAPE UTILITY XL STRL (DRAPES) ×1 IMPLANT
DRSG TELFA 3X8 NADH STRL (GAUZE/BANDAGES/DRESSINGS) IMPLANT
ELECT COATED BLADE 2.86 ST (ELECTRODE) IMPLANT
ELECT NDL BLADE 2-5/6 (NEEDLE) ×1 IMPLANT
ELECT NEEDLE BLADE 2-5/6 (NEEDLE) ×1
ELECT REM PT RETURN 9FT ADLT (ELECTROSURGICAL) ×1
ELECT REM PT RETURN 9FT PED (ELECTROSURGICAL)
ELECTRODE REM PT RETRN 9FT PED (ELECTROSURGICAL) IMPLANT
ELECTRODE REM PT RTRN 9FT ADLT (ELECTROSURGICAL) IMPLANT
EVACUATOR SILICONE 100CC (DRAIN) IMPLANT
GAUZE SPONGE 2X2 STRL 8-PLY (GAUZE/BANDAGES/DRESSINGS) IMPLANT
GAUZE SPONGE 4X4 12PLY STRL LF (GAUZE/BANDAGES/DRESSINGS) IMPLANT
GAUZE XEROFORM 1X8 LF (GAUZE/BANDAGES/DRESSINGS) IMPLANT
GLOVE BIO SURGEON STRL SZ8 (GLOVE) ×1 IMPLANT
GLOVE BIOGEL M STRL SZ7.5 (GLOVE) IMPLANT
GLOVE BIOGEL PI IND STRL 7.0 (GLOVE) IMPLANT
GLOVE BIOGEL PI IND STRL 7.5 (GLOVE) ×1 IMPLANT
GLOVE SURG SS PI 7.5 STRL IVOR (GLOVE) ×1 IMPLANT
GLOVE SURG SYN 7.5 E (GLOVE) ×2
GLOVE SURG SYN 7.5 PF PI (GLOVE) IMPLANT
GLOVE SURG SYN 8.0 (GLOVE) ×1
GLOVE SURG SYN 8.0 PF PI (GLOVE) IMPLANT
GOWN STRL REUS W/ TWL LRG LVL3 (GOWN DISPOSABLE) ×1 IMPLANT
GOWN STRL REUS W/ TWL XL LVL3 (GOWN DISPOSABLE) ×1 IMPLANT
GOWN STRL REUS W/TWL LRG LVL3 (GOWN DISPOSABLE) ×2
GOWN STRL REUS W/TWL XL LVL3 (GOWN DISPOSABLE) ×2
HIBICLENS CHG 4% 4OZ BTL (MISCELLANEOUS) ×1 IMPLANT
MARKER SKIN DUAL TIP RULER LAB (MISCELLANEOUS) IMPLANT
NDL FILTER BLUNT 18X1 1/2 (NEEDLE) IMPLANT
NDL HYPO 27GX1-1/4 (NEEDLE) ×1 IMPLANT
NDL HYPO 30GX1 BEV (NEEDLE) IMPLANT
NDL SAFETY ECLIP 18X1.5 (MISCELLANEOUS) IMPLANT
NEEDLE FILTER BLUNT 18X1 1/2 (NEEDLE)
NEEDLE HYPO 27GX1-1/4 (NEEDLE) ×1
NEEDLE HYPO 30GX1 BEV (NEEDLE)
NS IRRIG 1000ML POUR BTL (IV SOLUTION) IMPLANT
PACK BASIN DAY SURGERY FS (CUSTOM PROCEDURE TRAY) ×1 IMPLANT
PACK UNIVERSAL I (CUSTOM PROCEDURE TRAY) IMPLANT
PENCIL SMOKE EVACUATOR (MISCELLANEOUS) ×1 IMPLANT
SHEET MEDIUM DRAPE 40X70 STRL (DRAPES) IMPLANT
SLEEVE SCD COMPRESS KNEE MED (STOCKING) IMPLANT
SPONGE T-LAP 18X18 ~~LOC~~+RFID (SPONGE) IMPLANT
STAPLER VISISTAT 35W (STAPLE) ×1 IMPLANT
STRIP CLOSURE SKIN 1/2X4 (GAUZE/BANDAGES/DRESSINGS) IMPLANT
SUCTION TUBE FRAZIER 10FR DISP (SUCTIONS) IMPLANT
SUT ETHILON 4 0 PS 2 18 (SUTURE) IMPLANT
SUT MNCRL AB 3-0 PS2 18 (SUTURE) IMPLANT
SUT MNCRL AB 4-0 PS2 18 (SUTURE) IMPLANT
SUT MON AB 5-0 P3 18 (SUTURE) IMPLANT
SUT PDS 3-0 CT2 (SUTURE)
SUT PDS II 3-0 CT2 27 ABS (SUTURE) IMPLANT
SUT PLAIN 5 0 P 3 18 (SUTURE) IMPLANT
SUT PROLENE 5 0 P 3 (SUTURE) IMPLANT
SUT VIC AB 3-0 SH 27 (SUTURE)
SUT VIC AB 3-0 SH 27X BRD (SUTURE) IMPLANT
SUT VIC AB 4-0 PS2 18 (SUTURE) IMPLANT
SUT VLOC 90 P-14 23 (SUTURE) IMPLANT
SWAB COLLECTION DEVICE MRSA (MISCELLANEOUS) IMPLANT
SWAB CULTURE ESWAB REG 1ML (MISCELLANEOUS) IMPLANT
SYR 50ML LL SCALE MARK (SYRINGE) IMPLANT
SYR BULB EAR ULCER 3OZ GRN STR (SYRINGE) IMPLANT
SYR CONTROL 10ML LL (SYRINGE) ×1 IMPLANT
TOWEL GREEN STERILE FF (TOWEL DISPOSABLE) ×1 IMPLANT
TRAY DSU PREP LF (CUSTOM PROCEDURE TRAY) IMPLANT
TUBE CONNECTING 20X1/4 (TUBING) IMPLANT
YANKAUER SUCT BULB TIP NO VENT (SUCTIONS) IMPLANT

## 2023-05-27 NOTE — Anesthesia Preprocedure Evaluation (Signed)
Anesthesia Evaluation  Patient identified by MRN, date of birth, ID band Patient awake    Reviewed: Allergy & Precautions, H&P , NPO status , Patient's Chart, lab work & pertinent test results  Airway Mallampati: II   Neck ROM: full    Dental   Pulmonary asthma    breath sounds clear to auscultation       Cardiovascular negative cardio ROS  Rhythm:regular Rate:Normal     Neuro/Psych  PSYCHIATRIC DISORDERS Anxiety Depression Bipolar Disorder   Autism   GI/Hepatic   Endo/Other    Morbid obesity  Renal/GU      Musculoskeletal   Abdominal   Peds  Hematology   Anesthesia Other Findings   Reproductive/Obstetrics                             Anesthesia Physical Anesthesia Plan  ASA: 2  Anesthesia Plan: General   Post-op Pain Management:    Induction: Intravenous  PONV Risk Score and Plan: 3 and Ondansetron, Dexamethasone, Midazolam and Treatment may vary due to age or medical condition  Airway Management Planned: LMA  Additional Equipment:   Intra-op Plan:   Post-operative Plan: Extubation in OR  Informed Consent: I have reviewed the patients History and Physical, chart, labs and discussed the procedure including the risks, benefits and alternatives for the proposed anesthesia with the patient or authorized representative who has indicated his/her understanding and acceptance.     Dental advisory given  Plan Discussed with: CRNA, Anesthesiologist and Surgeon  Anesthesia Plan Comments:        Anesthesia Quick Evaluation

## 2023-05-27 NOTE — Interval H&P Note (Signed)
History and Physical Interval Note: No change in exam or indication for surgery Surgical site marked with her concurrence All questions answered to her satisfaction. Will proceed with left breast skin/cyst excision at her request  05/27/2023 12:47 PM  Amanda [name]  has presented today for surgery, with the diagnosis of Cyst left breast.  The various methods of treatment have been discussed with the patient and family. After consideration of risks, benefits and other options for treatment, the patient has consented to  Procedure(s): Left breast cyst excision (Left) as a surgical intervention.  The patient's history has been reviewed, patient examined, no change in status, stable for surgery.  I have reviewed the patient's chart and labs.  Questions were answered to the patient's satisfaction.     Santiago Glad

## 2023-05-27 NOTE — Anesthesia Procedure Notes (Signed)
Procedure Name: LMA Insertion Date/Time: 05/27/2023 1:39 PM  Performed by: Karen Kitchens, CRNAPre-anesthesia Checklist: Patient identified, Emergency Drugs available, Suction available and Patient being monitored Patient Re-evaluated:Patient Re-evaluated prior to induction Oxygen Delivery Method: Circle system utilized Preoxygenation: Pre-oxygenation with 100% oxygen Induction Type: IV induction Ventilation: Mask ventilation without difficulty LMA: LMA inserted LMA Size: 4.0 Number of attempts: 1 Airway Equipment and Method: Bite block Placement Confirmation: positive ETCO2, breath sounds checked- equal and bilateral and CO2 detector Tube secured with: Tape Dental Injury: Teeth and Oropharynx as per pre-operative assessment

## 2023-05-27 NOTE — Op Note (Signed)
DATE OF OPERATION: 05/27/2023  LOCATION: [facility] operating Room  PREOPERATIVE DIAGNOSIS: Skin cyst, infection left breast  POSTOPERATIVE DIAGNOSIS: Same  PROCEDURE: Excision of skin cyst left breast  SURGEON: Loren Racer, MD  ASSISTANT: Evelena Leyden, primary, Matt Scheeler  EBL: 10 cc  CONDITION: Stable  COMPLICATIONS: None  INDICATION: The patient, Amanda [name], is a 22 y.o. female born on 08/06/2001, is here for treatment and ongoing infection and cyst in the skin of the left breast.   PROCEDURE DETAILS:  The patient was seen prior to surgery and marked.  The IV antibiotics were given. The patient was taken to the operating room and given a general anesthetic. A standard time out was performed and all information was confirmed by those in the room. SCDs were placed.   The left breast was prepped and draped in usual manner.  The skin lesion was excised and divided with a portion going to microbiology and the remainder going to pathology.  The wound was irrigated and infiltrated with quarter percent Marcaine with epinephrine.  The skin edges were approximated at the level of the dermis with 3-0 Monocryl sutures and the skin closed with a running 4-0 Monocryl subcuticular stitch.  There were no complications.  The patient was wake from anesthesia without incident transferred to the recovery room in good condition.  All instrument needle and sponge counts reported as correct. The patient was allowed to wake up and taken to recovery room in stable condition at the end of the case. The family was notified at the end of the case.   The advanced practice practitioner (APP) assisted throughout the case.  The APP was essential in retraction and counter traction when needed to make the case progress smoothly.  This retraction and assistance made it possible to see the tissue plans for the procedure.  The assistance was needed for blood control, tissue re-approximation and assisted with  closure of the incision site.

## 2023-05-27 NOTE — Transfer of Care (Signed)
Immediate Anesthesia Transfer of Care Note  Patient: Amanda [name]  Procedure(s) Performed: Left breast cyst excision (Left: Breast)  Patient Location: PACU  Anesthesia Type:General  Level of Consciousness: awake, alert , and oriented  Airway & Oxygen Therapy: Patient Spontanous Breathing and Patient connected to face mask oxygen  Post-op Assessment: Report given to RN and Post -op Vital signs reviewed and stable  Post vital signs: Reviewed and stable  Last Vitals:  Vitals Value Taken Time  BP 124/82 05/27/23 1404  Temp    Pulse 79 05/27/23 1406  Resp 17 05/27/23 1406  SpO2 100 % 05/27/23 1406  Vitals shown include unfiled device data.  Last Pain:  Vitals:   05/27/23 1117  TempSrc: Oral  PainSc: 0-No pain      Patients Stated Pain Goal: 3 (05/27/23 1117)  Complications: No notable events documented.

## 2023-05-27 NOTE — Discharge Instructions (Addendum)
Activity: Avoid strenuous activity.  No lifting, pushing, or pulling greater than 15 pounds.  Diet: No restrictions.  Try to optimize nutrition with plenty of proteins, fruits, and vegetables to improve healing.   Wound Care: Leave breast binder on for the first 24 hours and then you may transition to a front-clipping or front-zipping compression bra.  Sponge bathe only for the first 72 hours.  Replace the ABD pads over incisions with gauze or Maxi pads as needed for incisional drainage.   Pain control: Take ibuprofen 600 mg every 6 hours, alternating with Tylenol 500 mg every 6 hours.  Your oxycodone should only be used if absolutely needed for breakthrough pain.  It is addictive and sedative.  You cannot drive while taking that medication.  You should not require more than just a couple of those pills, if any.    Follow-Up: As scheduled.  Things to watch for:  Call the office if you experience fever, chills, intractable vomiting, or significant bleeding.  Mild wound drainage is common after breast reduction surgery and should not be cause for alarm.    Post Anesthesia Home Care Instructions  Activity: Get plenty of rest for the remainder of the day. A responsible individual must stay with you for 24 hours following the procedure.  For the next 24 hours, DO NOT: -Drive a car -Advertising copywriter -Drink alcoholic beverages -Take any medication unless instructed by your physician -Make any legal decisions or sign important papers.  Meals: Start with liquid foods such as gelatin or soup. Progress to regular foods as tolerated. Avoid greasy, spicy, heavy foods. If nausea and/or vomiting occur, drink only clear liquids until the nausea and/or vomiting subsides. Call your physician if vomiting continues.  Special Instructions/Symptoms: Your throat may feel dry or sore from the anesthesia or the breathing tube placed in your throat during surgery. If this causes discomfort, gargle with warm salt  water. The discomfort should disappear within 24 hours.  If you had a scopolamine patch placed behind your ear for the management of post- operative nausea and/or vomiting:  1. The medication in the patch is effective for 72 hours, after which it should be removed.  Wrap patch in a tissue and discard in the trash. Wash hands thoroughly with soap and water. 2. You may remove the patch earlier than 72 hours if you experience unpleasant side effects which may include dry mouth, dizziness or visual disturbances. 3. Avoid touching the patch. Wash your hands with soap and water after contact with the patch.

## 2023-05-28 NOTE — Anesthesia Postprocedure Evaluation (Signed)
Anesthesia Post Note  Patient: Zenola Corti  Procedure(s) Performed: Left breast cyst excision (Left: Breast)     Patient location during evaluation: PACU Anesthesia Type: General Level of consciousness: awake and alert Pain management: pain level controlled Vital Signs Assessment: post-procedure vital signs reviewed and stable Respiratory status: spontaneous breathing, nonlabored ventilation, respiratory function stable and patient connected to nasal cannula oxygen Cardiovascular status: blood pressure returned to baseline and stable Postop Assessment: no apparent nausea or vomiting Anesthetic complications: no   No notable events documented.  Last Vitals:  Vitals:   05/27/23 1410 05/27/23 1430  BP: 124/82 (!) 129/96  Pulse: (!) 103 79  Resp: 13 16  Temp: 37.9 C 36.9 C  SpO2: 100% 98%    Last Pain:  Vitals:   05/27/23 1430  TempSrc:   PainSc: 0-No pain                 Maribell Demeo

## 2023-05-30 ENCOUNTER — Encounter (HOSPITAL_BASED_OUTPATIENT_CLINIC_OR_DEPARTMENT_OTHER): Payer: Self-pay | Admitting: Plastic Surgery

## 2023-05-30 LAB — SURGICAL PATHOLOGY

## 2023-06-01 LAB — AEROBIC/ANAEROBIC CULTURE W GRAM STAIN (SURGICAL/DEEP WOUND): Gram Stain: NONE SEEN

## 2023-06-02 ENCOUNTER — Encounter: Payer: Self-pay | Admitting: Plastic Surgery

## 2023-06-02 ENCOUNTER — Ambulatory Visit (INDEPENDENT_AMBULATORY_CARE_PROVIDER_SITE_OTHER): Payer: Medicare Other | Admitting: Plastic Surgery

## 2023-06-02 VITALS — BP 121/83 | HR 68

## 2023-06-02 DIAGNOSIS — L720 Epidermal cyst: Secondary | ICD-10-CM

## 2023-06-02 DIAGNOSIS — L989 Disorder of the skin and subcutaneous tissue, unspecified: Secondary | ICD-10-CM

## 2023-06-02 DIAGNOSIS — B3731 Acute candidiasis of vulva and vagina: Secondary | ICD-10-CM

## 2023-06-02 MED ORDER — FLUCONAZOLE 150 MG PO TABS: 150.0000 mg | ORAL_TABLET | Freq: Once | ORAL | 0 refills | Status: AC

## 2023-06-02 NOTE — Progress Notes (Signed)
Ms. Mccleaf returns today approximately 1 week postop from excision of a cyst on the inferior portion of her left breast.  She is doing well with no complaints other than a vaginal yeast infection which is probably result of her perioperative antibiotics.  The incision is clean dry and intact and there is no evidence of infection.  Pathology was unremarkable with a ruptured epidermal inclusion cyst which was completely removed.  Microbiology showed a Staphylococcus which was sensitive to the antibiotics that she was given in the operating room.  Activity as tolerated.  Vaseline to the incision.  I will prescribe a dose of Diflucan for her yeast infection.  She is instructed to keep her follow-up appointment on September 25 and to call if she has any problems between now and then.

## 2023-06-03 ENCOUNTER — Telehealth: Payer: Self-pay

## 2023-06-03 NOTE — Telephone Encounter (Signed)
Pt called yesterday and stated that a stitch came out of her incision and she has a small hole. I adv to apply vaseline, guaze and use tape to hold it in place or her sports bra. I asked if she could come in today to be evaluated, she stated that she was going out of town. I adv that I would send a note to the surgeon and PA that she has been seeing and would our office would give her a call if instructed by the provider. I adv if she has redness, increased swelling, develops a fever, n/v or has increased pain to call the office back and the provider on call would be able to assist her. She conveyed understanding.

## 2023-06-06 ENCOUNTER — Encounter: Payer: Medicare Other | Admitting: Student

## 2023-06-06 NOTE — Telephone Encounter (Signed)
I called patient to see if she wanted to come in tomorrow.  She stated that she is going out of town tomorrow.  She reported that the area is red but it is not oozing and that if she were to have any problems while she is out of town, she will got to an Urgent Care.  She has an appt scheduled for next Wed, 9/25.

## 2023-06-07 ENCOUNTER — Ambulatory Visit (INDEPENDENT_AMBULATORY_CARE_PROVIDER_SITE_OTHER): Payer: Medicare Other | Admitting: Student

## 2023-06-07 ENCOUNTER — Encounter: Payer: Self-pay | Admitting: Student

## 2023-06-07 VITALS — BP 115/78 | HR 79 | Ht 64.0 in | Wt 235.0 lb

## 2023-06-07 DIAGNOSIS — L989 Disorder of the skin and subcutaneous tissue, unspecified: Secondary | ICD-10-CM

## 2023-06-07 NOTE — Progress Notes (Signed)
Patient is a 22 year old female with history of a skin cyst/infection to the left breast.  She underwent excision of the skin cyst of the left breast with Dr. Ladona Ridgel on 05/27/2023.  During the procedure, the skin lesion was excised and the dermis was reapproximated with 3-0 Monocryl sutures and the skin closed with a 4-0 Monocryl subcuticular stitch.  Cyst was sent to pathology and was shown to be a benign epidermal cyst with inflammation consistent with rupture, negative for malignancy.  Microbiology did show staph lugdunensis.  Patient presents to the clinic today with concerns about a suture.  Patient was last seen in the clinic on 06/02/2023.  At this visit, patient was doing well.  The incision was clean dry and intact, no evidence of infection.  Plan is for patient to apply Vaseline to the incision.  Today, patient reports she is doing okay.  She states that she noticed a little bit of redness near her incision and was concerned about it.  She denies any significant drainage.  She denies any fevers or chills.  She denies any tenderness to the area.  Chaperone present on exam.  On exam, patient is sitting upright in no acute distress.  To the medial part of the incision, there is a superficial wound approximately 1.5 cm long.  There is some surrounding irritation.  No active drainage on exam.  No tenderness to palpation.  To the skin to the breast, there are few superficial wounds as well, suspect from adhesives.  There are no obvious signs of infection on exam.  No fluid collections palpated on exam.  Discussed with patient that I would like her to apply Vaseline and a dressing over the incision and the wounds daily.  Discussed with her she can use an ABD pad and side of her bra.  Recommended she avoid adhesives.  Patient expressed understanding.  Discussed with patient that she should continue to wear compression at all times and avoid bras with underwire in them.  Patient expressed  understanding.  Patient to follow back up next week.  Discussed with patient to continue to monitor her breast and to call us back if she has any questions or concerns about anything.  Pictures were obtained of the patient and placed in the chart with the patient's or guardian's permission.

## 2023-06-15 ENCOUNTER — Encounter: Payer: Medicare Other | Admitting: Physician Assistant

## 2023-06-22 ENCOUNTER — Telehealth: Payer: Medicare Other | Admitting: Physician Assistant

## 2023-06-29 ENCOUNTER — Encounter: Payer: Medicare Other | Admitting: Physician Assistant

## 2023-07-13 ENCOUNTER — Ambulatory Visit: Payer: Medicare Other | Admitting: Physician Assistant

## 2023-07-13 ENCOUNTER — Encounter: Payer: Self-pay | Admitting: Physician Assistant

## 2023-07-13 VITALS — BP 122/84 | HR 93

## 2023-07-13 DIAGNOSIS — L720 Epidermal cyst: Secondary | ICD-10-CM

## 2023-07-13 NOTE — Progress Notes (Signed)
Patient is a pleasant 22 year old female s/p excision of left breast lesion performed 05/27/2023 by Dr. Ladona Ridgel who presents to clinic for postoperative follow-up.  Reviewed operative report and pathology was consistent with benign epidermal cyst, negative for malignancy.  She was seen in office for postoperative visits on 06/02/2023 and 06/07/2023.  At that time, there was a superficial wound approximately 1.5 cm long medially at the excision site.  Some surrounding irritation, but no active drainage.  Evidence of tape dermatitis, but no infection.  Advised patient to follow-up in 1 week.  Unfortunately she missed the subsequent 3 office visits.  Today, she is doing well.  She states that she had a protruding suture that she removed herself with tweezers.  She also states that she had a bit of recurrence of pimple on the most medial aspect which she admittedly picked at with her nails causing some irritation, but otherwise she is well-healed and doing well.  She will occasionally have what she describes as a sharp fleeting discomfort, likely neuropathic, that has gotten progressively better since time of surgery.  No other issues or concerns.  On exam, excision site appears to be well-healed.  Mild irritation on both the medial and lateral edges, likely from removal of the suture knots.  No evidence concerning for infection.  No surrounding erythema or overlying skin changes.  Area is soft, no underlying masses or firmness noted.  At this point, she can transition to silicone scar gels twice daily x 3 months.  Appears well-healed.  No specific follow-up needed.  She understands that she can call the clinic should she have any questions or concerns.  Picture(s) obtained of the patient and placed in the chart were with the patient's or guardian's permission.

## 2023-07-18 ENCOUNTER — Other Ambulatory Visit: Payer: Self-pay

## 2023-07-18 ENCOUNTER — Encounter (HOSPITAL_COMMUNITY): Payer: Self-pay

## 2023-07-18 ENCOUNTER — Emergency Department (HOSPITAL_COMMUNITY)
Admission: EM | Admit: 2023-07-18 | Discharge: 2023-07-18 | Disposition: A | Discharge status: Home / Self Care | Payer: Medicare Other | Attending: Emergency Medicine | Admitting: Emergency Medicine

## 2023-07-18 DIAGNOSIS — K029 Dental caries, unspecified: Secondary | ICD-10-CM | POA: Diagnosis not present

## 2023-07-18 DIAGNOSIS — Z91040 Latex allergy status: Secondary | ICD-10-CM | POA: Insufficient documentation

## 2023-07-18 DIAGNOSIS — K0889 Other specified disorders of teeth and supporting structures: Secondary | ICD-10-CM | POA: Diagnosis present

## 2023-07-18 DIAGNOSIS — F840 Autistic disorder: Secondary | ICD-10-CM | POA: Insufficient documentation

## 2023-07-18 MED ORDER — TRAMADOL HCL 50 MG PO TABS
50.0000 mg | ORAL_TABLET | Freq: Four times a day (QID) | ORAL | 0 refills | Status: DC | PRN
Start: 2023-07-18 — End: 2024-07-23

## 2023-07-18 MED ORDER — HYDROCODONE-ACETAMINOPHEN 5-325 MG PO TABS
1.0000 | ORAL_TABLET | Freq: Once | ORAL | Status: AC
  Administered 2023-07-18: 1 via ORAL
  Filled 2023-07-18: qty 1

## 2023-07-18 NOTE — ED Provider Notes (Signed)
[facility] Provider Note   CSN: [id number] Arrival date & time: 07/18/23  1302     History  Chief Complaint  Patient presents with   Dental Pain    Amanda [name] is a 22 y.o. female.  With a history of ODD, ADHD, anxiety, depression, autism presenting to the ED for evaluation of right lower dental pain.  She states she has had pain to her teeth in varying locations over the past 2 months.  She has an appointment with a dentist on 11/13 but is unable to be seen sooner.  She has been taking large amounts of Tylenol and ibuprofen for the pain without improvement.  She has been using Orajel as well.  Pain is localized to the right lower posterior teeth at this time.  No fevers or chills.  Chewing makes the pain worse.  No difficulty swallowing.  No changes to voice.   Dental Pain      Home Medications Prior to Admission medications   Medication Sig Start Date End Date Taking? Authorizing Provider  traMADol (ULTRAM) 50 MG tablet Take 1 tablet (50 mg total) by mouth every 6 (six) hours as needed. 07/18/23  Yes Zena Vitelli, Edsel Petrin, PA-C  ABILIFY MAINTENA 400 MG PRSY prefilled syringe Inject 1 mL (200 mg total) into the muscle every 30 (thirty) days. (Due on 06-27-20): For mood control 06/30/23   [provider]  ABILIFY MAINTENA 400 MG SRER injection Inject 1 mL (200 mg total) into the muscle every 30 (thirty) days. (Due on 06-27-20): For mood control 06/27/20   Armandina Stammer I, NP  ARIPiprazole (ABILIFY) 10 MG tablet Take 10 mg by mouth daily. 04/18/23   [provider]  ARIPiprazole (ABILIFY) 15 MG tablet Take 15 mg by mouth daily. 07/01/23   [provider]  benzonatate (TESSALON) 100 MG capsule Take 100 mg by mouth every 8 (eight) hours. 06/21/23   [provider]  ciprofloxacin (CIPRO) 500 MG tablet Take 500 mg by mouth 2 (two) times daily. 06/29/23   [provider]  citalopram (CELEXA) 20 MG  tablet Take 20 mg by mouth daily. 03/04/23   [provider]  clindamycin (CLEOCIN) 300 MG capsule Take 300 mg by mouth every 6 (six) hours. 01/19/23   [provider]  fluconazole (DIFLUCAN) 150 MG tablet Take 150 mg by mouth once. 06/29/23   [provider]  lithium 300 MG tablet Take 300 mg by mouth 2 (two) times daily. 04/11/23   [provider]  Olopatadine HCl 0.2 % SOLN Apply 1 drop to eye daily. 06/10/22   Marcelyn Bruins, MD  ondansetron (ZOFRAN-ODT) 4 MG disintegrating tablet Take 1 tablet (4 mg total) by mouth every 8 (eight) hours as needed for nausea or vomiting. 05/17/23   Lorelee New, PA-C  predniSONE (DELTASONE) 10 MG tablet Take by mouth. 06/21/23   [provider]  QUEtiapine (SEROQUEL) 25 MG tablet Take 25 mg by mouth 2 (two) times daily as needed. 04/18/23   [provider]  tiZANidine (ZANAFLEX) 4 MG tablet Take 4 mg by mouth 2 (two) times daily. 03/15/23   [provider]  VENTOLIN HFA 108 (90 Base) MCG/ACT inhaler INHALE TWO PUFFS INTO THE LUNGS EVERY 4 HOURS AS NEEDED FOR WHEEZING AND SHORTNESS OF BREATH 12/06/22   Marcelyn Bruins, MD  fluticasone (FLONASE) 50 MCG/ACT nasal spray Place 2 sprays into both nostrils daily. Patient not taking: Reported on 08/01/2019 12/15/18 08/01/19  Loren Racer, MD  loratadine (CLARITIN) 10 MG tablet Take 1 tablet (10 mg total) by mouth daily. Patient not taking: Reported on 05/28/2019 12/15/18 08/01/19  Loren Racer, MD      Allergies    Poison ivy extract [poison ivy extract], Sulfa antibiotics, Bee pollen, Fluoxetine, Molds & smuts, Latex, and Wound dressing adhesive    Review of Systems   Review of Systems  HENT:  Positive for dental problem.   All other systems reviewed and are negative.   Physical Exam Updated Vital Signs BP (!) 127/103 (BP Location: Right Arm)   Pulse 91   Temp 98.1 F (36.7 C) (Oral)   Resp 15   Ht 5\' 4"  (1.626 m)   Wt  109.8 kg   LMP 07/07/2023 (Approximate)   SpO2 100%   BMI 41.54 kg/m  Physical Exam Vitals and nursing note reviewed.  Constitutional:      General: She is not in acute distress.    Appearance: Normal appearance. She is normal weight. She is not ill-appearing.  HENT:     Head: Normocephalic and atraumatic.     Mouth/Throat:     Comments: Numerous dental caries.  Mild TTP to the right lower teeth.  No tooth fractures.  No gum erythema or purulent drainage.  Soft palate rises with phonation.  Uvula midline.  Tonsils 0 bilaterally.  No submandibular erythema or induration.  No drooling, trismus or tripoding. Pulmonary:     Effort: Pulmonary effort is normal. No respiratory distress.  Abdominal:     General: Abdomen is flat.  Musculoskeletal:        General: Normal range of motion.     Cervical back: Neck supple.  Skin:    General: Skin is warm and dry.  Neurological:     Mental Status: She is alert and oriented to person, place, and time.  Psychiatric:        Mood and Affect: Mood normal.        Behavior: Behavior normal.     ED Results / Procedures / Treatments   Labs (all labs ordered are listed, but only abnormal results are displayed) Labs Reviewed - No data to display  EKG None  Radiology No results found.  Procedures Procedures    Medications Ordered in ED Medications  HYDROcodone-acetaminophen (NORCO/VICODIN) 5-325 MG per tablet 1 tablet (has no administration in time range)    ED Course/ Medical Decision Making/ A&P                                 Medical Decision Making Risk Prescription drug management.  This patient presents to the ED for concern of dental pain, this involves an extensive number of treatment options, and is a complaint that carries with it a high risk of complications and morbidity.  The differential diagnosis includes dental fracture, pulpitis, periapical or other periodontal abscess  Additional history obtained from: Nursing  notes from this visit.  Afebrile, hemodynamically stable.  22 year old female presenting to the ED for evaluation of right lower dental pain.  This been intermittent over the past 2 months.  She has an appointment with a dentist in 2 weeks.  She has been using Tylenol and ibuprofen at home with minimal improvement.  Chewing makes the pain worse.  No difficulty swallowing or breathing.  She appears very well on physical exam.  No evidence of dental abscess or infection at this time.  No evidence  for Ludwig's angina.  No history of seizures.  She was sent a short course of tramadol and educated on potential side effects.  She was encouraged to follow-up with a dentist sooner if possible and given a resource guide.  She was given return precautions.  Stable at discharge.  At this time there does not appear to be any evidence of an acute emergency medical condition and the patient appears stable for discharge with appropriate outpatient follow up. Diagnosis was discussed with patient who verbalizes understanding of care plan and is agreeable to discharge. I have discussed return precautions with patient who verbalizes understanding. Patient encouraged to follow-up with their PCP within 1 week. All questions answered.  Note: Portions of this report may have been transcribed using voice recognition software. Every effort was made to ensure accuracy; however, inadvertent computerized transcription errors may still be present.        Final Clinical Impression(s) / ED Diagnoses Final diagnoses:  Pain due to dental caries    Rx / DC Orders ED Discharge Orders          Ordered    traMADol (ULTRAM) 50 MG tablet  Every 6 hours PRN        07/18/23 1500              Michelle Piper, PA-C 07/18/23 1504    Pricilla Loveless, MD 07/19/23 [phone]

## 2023-07-18 NOTE — ED Triage Notes (Signed)
Pt c/o right lower dental pain x 2 months. Pt states she is unable to get into a dentist until 11/13. Pt states she has taken ibuprofen and tylenol w/o relief.

## 2023-07-18 NOTE — Discharge Instructions (Addendum)
You have been seen today for your complaint of dental pain. Your discharge medications include tramadol. This is an opioid pain medication. You should only take this medication as needed for severe pain. You should not drive, operate heavy machinery or make important decisions while taking this medication. You should use alternative methods for pain relief while taking this medication including stretching, gentle range of motion, and alternating tylenol and ibuprofen. Follow up with: A dentist as soon as you can Please seek immediate medical care if you develop any of the following symptoms: Fevers, throat swelling, difficulty swallowing or breathing At this time there does not appear to be the presence of an emergent medical condition, however there is always the potential for conditions to change. Please read and follow the below instructions.  Do not take your medicine if  develop an itchy rash, swelling in your mouth or lips, or difficulty breathing; call 911 and seek immediate emergency medical attention if this occurs.  You may review your lab tests and imaging results in their entirety on your MyChart account.  Please discuss all results of fully with your primary care provider and other specialist at your follow-up visit.  Note: Portions of this text may have been transcribed using voice recognition software. Every effort was made to ensure accuracy; however, inadvertent computerized transcription errors may still be present.

## 2023-07-31 ENCOUNTER — Emergency Department (HOSPITAL_COMMUNITY)
Admission: EM | Admit: 2023-07-31 | Discharge: 2023-08-01 | Disposition: A | Discharge status: Home / Self Care | Payer: Medicare Other | Attending: Emergency Medicine | Admitting: Emergency Medicine

## 2023-07-31 DIAGNOSIS — K0889 Other specified disorders of teeth and supporting structures: Secondary | ICD-10-CM | POA: Insufficient documentation

## 2023-07-31 NOTE — ED Triage Notes (Signed)
Pt c/o dental pain , on right lower side in the back, pt has been seen for this 3 times, reports dentist appt on the 08/03/23

## 2023-08-01 DIAGNOSIS — K0889 Other specified disorders of teeth and supporting structures: Secondary | ICD-10-CM | POA: Diagnosis not present

## 2023-08-01 MED ORDER — KETOROLAC TROMETHAMINE 30 MG/ML IJ SOLN
30.0000 mg | Freq: Once | INTRAMUSCULAR | Status: AC
  Administered 2023-08-01: 30 mg via INTRAMUSCULAR
  Filled 2023-08-01: qty 1

## 2023-08-01 MED ORDER — PENICILLIN V POTASSIUM 500 MG PO TABS
500.0000 mg | ORAL_TABLET | Freq: Once | ORAL | Status: AC
  Administered 2023-08-01: 500 mg via ORAL
  Filled 2023-08-01: qty 1

## 2023-08-01 MED ORDER — PENICILLIN V POTASSIUM 500 MG PO TABS: 500.0000 mg | ORAL_TABLET | Freq: Four times a day (QID) | ORAL | 0 refills | Status: AC

## 2023-08-01 MED ORDER — FLUCONAZOLE 150 MG PO TABS: ORAL_TABLET | ORAL | 0 refills | Status: DC

## 2023-08-01 NOTE — ED Provider Notes (Signed)
[facility] DEPT [facility] Emergency Department Provider Note MRN:  [id number]  Arrival date & time: 08/01/23     Chief Complaint   Dental Pain   History of Present Illness   Amanda [name] is a 22 y.o. year-old female presents to the ED with chief complaint of dental pain.  She states that the pain has been ongoing for a while.  States that she has been treating her pain with tramadol and Tylenol.  She hasn't been on antibiotics.  She is scheduled to see the dentist in 2 days.  She denies fevers or chills.  She denies pregnancy or breast feeding.  History provided by patient.   Review of Systems  Pertinent positive and negative review of systems noted in HPI.    Physical Exam   Vitals:   07/31/23 2121  Pulse: 98  Resp: 20  Temp: 98 F (36.7 C)  SpO2: 100%    CONSTITUTIONAL:  non toxic-appearing, NAD NEURO:  Alert and oriented x 3, CN 3-12 grossly intact EYES:  eyes equal and reactive ENT/NECK:  Supple, no stridor, no drainable abscess, no swelling, no sign of ludwig's angina CARDIO:  appears well-perfused  PULM:  No respiratory distress,  GI/GU:  non-distended,  MSK/SPINE:  No gross deformities, no edema, moves all extremities  SKIN:  no rash, atraumatic   *Additional and/or pertinent findings included in MDM below  Diagnostic and Interventional Summary    EKG Interpretation Date/Time:    Ventricular Rate:    PR Interval:    QRS Duration:    QT Interval:    QTC Calculation:   R Axis:      Text Interpretation:         Labs Reviewed - No data to display  No orders to display    Medications  ketorolac (TORADOL) 30 MG/ML injection 30 mg (has no administration in time range)  penicillin v potassium (VEETID) tablet 500 mg (has no administration in time range)     Procedures  /  Critical Care Procedures  ED Course and Medical Decision Making  I have reviewed the triage vital signs, the nursing notes, and pertinent available records from the  EMR.  Social Determinants Affecting Complexity of Care: Patient has no clinically significant social determinants affecting this chief complaint..   ED Course:    Medical Decision Making Risk Prescription drug management.    Patient with dentalgia.  No abscess requiring immediate incision and drainage.  Exam not concerning for Ludwig's angina or pharyngeal abscess.  Will treat with penicillin. Pt instructed to follow-up with dentist.  Discussed return precautions. Pt safe for discharge.      Consultants: No consultations were needed in caring for this patient.   Treatment and Plan: Emergency department workup does not suggest an emergent condition requiring admission or immediate intervention beyond  what has been performed at this time. The patient is safe for discharge and has  been instructed to return immediately for worsening symptoms, change in  symptoms or any other concerns    Final Clinical Impressions(s) / ED Diagnoses     ICD-10-CM   1. Pain, dental  K08.89       ED Discharge Orders          Ordered    penicillin v potassium (VEETID) 500 MG tablet  4 times daily        08/01/23 0027    fluconazole (DIFLUCAN) 150 MG tablet        08/01/23 0027  Discharge Instructions Discussed with and Provided to Patient:   Discharge Instructions   None      Roxy Horseman, PA-C 08/01/23 0029    Nira Conn, MD 08/01/23 [phone]

## 2023-09-25 ENCOUNTER — Encounter (HOSPITAL_BASED_OUTPATIENT_CLINIC_OR_DEPARTMENT_OTHER): Payer: Self-pay | Admitting: Emergency Medicine

## 2023-09-25 ENCOUNTER — Other Ambulatory Visit: Payer: Self-pay

## 2023-09-25 ENCOUNTER — Emergency Department (HOSPITAL_BASED_OUTPATIENT_CLINIC_OR_DEPARTMENT_OTHER)
Admission: EM | Admit: 2023-09-25 | Discharge: 2023-09-25 | Disposition: A | Discharge status: Home / Self Care | Payer: Medicare Other | Attending: Emergency Medicine | Admitting: Emergency Medicine

## 2023-09-25 ENCOUNTER — Emergency Department (HOSPITAL_BASED_OUTPATIENT_CLINIC_OR_DEPARTMENT_OTHER): Payer: Medicare Other

## 2023-09-25 DIAGNOSIS — J45909 Unspecified asthma, uncomplicated: Secondary | ICD-10-CM | POA: Insufficient documentation

## 2023-09-25 DIAGNOSIS — Z91040 Latex allergy status: Secondary | ICD-10-CM | POA: Diagnosis not present

## 2023-09-25 DIAGNOSIS — Z7952 Long term (current) use of systemic steroids: Secondary | ICD-10-CM | POA: Diagnosis not present

## 2023-09-25 DIAGNOSIS — J069 Acute upper respiratory infection, unspecified: Secondary | ICD-10-CM | POA: Insufficient documentation

## 2023-09-25 DIAGNOSIS — R059 Cough, unspecified: Secondary | ICD-10-CM | POA: Diagnosis present

## 2023-09-25 DIAGNOSIS — Z20822 Contact with and (suspected) exposure to covid-19: Secondary | ICD-10-CM | POA: Insufficient documentation

## 2023-09-25 HISTORY — DX: Personal history of diseases of the skin and subcutaneous tissue: Z87.2

## 2023-09-25 LAB — RESP PANEL BY RT-PCR (RSV, FLU A&B, COVID)  RVPGX2
Influenza A by PCR: NEGATIVE
Influenza B by PCR: NEGATIVE
Resp Syncytial Virus by PCR: NEGATIVE
SARS Coronavirus 2 by RT PCR: NEGATIVE

## 2023-09-25 MED ORDER — DOXYCYCLINE HYCLATE 100 MG PO CAPS: 100.0000 mg | ORAL_CAPSULE | Freq: Two times a day (BID) | ORAL | 0 refills | Status: DC

## 2023-09-25 MED ORDER — ALBUTEROL SULFATE HFA 108 (90 BASE) MCG/ACT IN AERS
2.0000 | INHALATION_SPRAY | Freq: Once | RESPIRATORY_TRACT | Status: AC
  Administered 2023-09-25: 2 via RESPIRATORY_TRACT
  Filled 2023-09-25: qty 6.7

## 2023-09-25 MED ORDER — FLUCONAZOLE 150 MG PO TABS: 150.0000 mg | ORAL_TABLET | Freq: Every day | ORAL | 0 refills | Status: DC

## 2023-09-25 MED ORDER — HYDROCODONE BIT-HOMATROP MBR 5-1.5 MG/5ML PO SOLN: 5.0000 mL | Freq: Once | ORAL | Status: DC

## 2023-09-25 MED ORDER — HYDROCOD POLI-CHLORPHE POLI ER 10-8 MG/5ML PO SUER
5.0000 mL | Freq: Once | ORAL | Status: AC
  Administered 2023-09-25: 5 mL via ORAL
  Filled 2023-09-25: qty 5

## 2023-09-25 MED ORDER — BENZONATATE 100 MG PO CAPS
100.0000 mg | ORAL_CAPSULE | Freq: Once | ORAL | Status: AC
  Administered 2023-09-25: 100 mg via ORAL
  Filled 2023-09-25: qty 1

## 2023-09-25 NOTE — Discharge Instructions (Signed)
 Take the antibiotics as prescribed.  Continue your cough medications.  You may check your COVID results online later today.  Return to the ED with exertional chest pain, pain associate with shortness of breath, nausea, vomiting, sweating or other concerns.

## 2023-09-25 NOTE — ED Triage Notes (Signed)
 Pt reports cough that started Monday, states she was dx on Friday with bronchitis and given Promethazine and Tessalon  for her cough. She states she does not feel the medicine is helping. She reports accidentally taking too much Promethazine on Friday, but states she has been taking the prescribed dose since Saturday afternoon. Denies fever. Would like to be checked out again. Pt had CXR on Friday which was negative.

## 2023-09-25 NOTE — ED Provider Notes (Signed)
 [facility] Provider Note   CSN: [id number] Arrival date & time: 09/25/23  0341     History  Chief Complaint  Patient presents with   Cough    Amanda [name] is a 23 y.o. female.  Patient with a history of asthma here with cough for the past 5 days.  Cough is nonproductive and keeping her awake at night.  It is productive of clear mucus but not much is coming up.  She been taking promethazine and Tessalon  at home without relief.  Came in because she could not stop coughing at home.  Does smoke marijuana but has not been using it since she has been sick.  Some chest pain with coughing.  No fever.  Does have sore throat.  No body aches or chills.  No abdominal pain, nausea or vomiting.  Chest pain only with coughing.  No leg pain or leg swelling.  No history of birth control use She does not think she has been wheezing.  No vomiting or diarrhea.  Chest pain only with coughing. She reportedly had a negative chest x-ray at urgent care.  The history is provided by the patient.  Cough Associated symptoms: no chest pain, no fever, no headaches, no myalgias, no rash and no rhinorrhea        Home Medications Prior to Admission medications   Medication Sig Start Date End Date Taking? Authorizing Provider  ABILIFY  MAINTENA 400 MG PRSY prefilled syringe Inject 1 mL (200 mg total) into the muscle every 30 (thirty) days. (Due on 06-27-20): For mood control 06/30/23   [provider]  ABILIFY  MAINTENA 400 MG SRER injection Inject 1 mL (200 mg total) into the muscle every 30 (thirty) days. (Due on 06-27-20): For mood control 06/27/20   Collene Gouge I, NP  ARIPiprazole  (ABILIFY ) 10 MG tablet Take 10 mg by mouth daily. 04/18/23   [provider]  ARIPiprazole  (ABILIFY ) 15 MG tablet Take 15 mg by mouth daily. 07/01/23   [provider]  benzonatate  (TESSALON ) 100 MG capsule Take 100 mg by mouth every 8 (eight) hours. 06/21/23    [provider]  ciprofloxacin  (CIPRO ) 500 MG tablet Take 500 mg by mouth 2 (two) times daily. 06/29/23   [provider]  citalopram  (CELEXA ) 20 MG tablet Take 20 mg by mouth daily. 03/04/23   [provider]  clindamycin (CLEOCIN) 300 MG capsule Take 300 mg by mouth every 6 (six) hours. 01/19/23   [provider]  fluconazole  (DIFLUCAN ) 150 MG tablet Take one tablet today and one tablet after finishing the antibiotic. 08/01/23   Vicky Charleston, PA-C  lithium  300 MG tablet Take 300 mg by mouth 2 (two) times daily. 04/11/23   [provider]  Olopatadine  HCl 0.2 % SOLN Apply 1 drop to eye daily. 06/10/22   Jeneal Danita Macintosh, MD  ondansetron  (ZOFRAN -ODT) 4 MG disintegrating tablet Take 1 tablet (4 mg total) by mouth every 8 (eight) hours as needed for nausea or vomiting. 05/17/23   Landy Honora CROME, PA-C  predniSONE  (DELTASONE ) 10 MG tablet Take by mouth. 06/21/23   [provider]  QUEtiapine (SEROQUEL) 25 MG tablet Take 25 mg by mouth 2 (two) times daily as needed. 04/18/23   [provider]  tiZANidine (ZANAFLEX) 4 MG tablet Take 4 mg by mouth 2 (two) times daily. 03/15/23   [provider]  traMADol  (ULTRAM ) 50 MG tablet Take 1 tablet (50 mg total) by mouth every 6 (  six) hours as needed. 07/18/23   Schutt, Marsa HERO, PA-C  VENTOLIN  HFA 108 (90 Base) MCG/ACT inhaler INHALE TWO PUFFS INTO THE LUNGS EVERY 4 HOURS AS NEEDED FOR WHEEZING AND SHORTNESS OF BREATH 12/06/22   Jeneal Danita Macintosh, MD  fluticasone  (FLONASE ) 50 MCG/ACT nasal spray Place 2 sprays into both nostrils daily. Patient not taking: Reported on 08/01/2019 12/15/18 08/01/19  Carlyle Lenis, MD  loratadine  (CLARITIN ) 10 MG tablet Take 1 tablet (10 mg total) by mouth daily. Patient not taking: Reported on 05/28/2019 12/15/18 08/01/19  Carlyle Lenis, MD      Allergies    Poison ivy extract [poison ivy extract], Sulfa antibiotics, Bee pollen, Fluoxetine,  Molds & smuts, Latex, and Wound dressing adhesive    Review of Systems   Review of Systems  Constitutional:  Negative for activity change, appetite change and fever.  HENT:  Positive for congestion. Negative for rhinorrhea.   Respiratory:  Positive for cough.   Cardiovascular:  Negative for chest pain.  Gastrointestinal:  Negative for abdominal pain, nausea and vomiting.  Genitourinary:  Negative for dysuria.  Musculoskeletal:  Negative for arthralgias and myalgias.  Skin:  Negative for rash.  Neurological:  Negative for dizziness, weakness and headaches.   all other systems are negative except as noted in the HPI and PMH.    Physical Exam Updated Vital Signs BP 111/82 (BP Location: Right Arm)   Pulse 83   Temp 98.4 F (36.9 C) (Oral)   Resp 20   Ht 5' 4 (1.626 m)   Wt 111.1 kg   LMP 08/30/2023   SpO2 95%   BMI 42.05 kg/m  Physical Exam Vitals and nursing note reviewed.  Constitutional:      General: She is not in acute distress.    Appearance: She is well-developed. She is obese.     Comments: No distress Frequent dry cough  HENT:     Head: Normocephalic and atraumatic.     Mouth/Throat:     Pharynx: No oropharyngeal exudate.  Eyes:     Conjunctiva/sclera: Conjunctivae normal.     Pupils: Pupils are equal, round, and reactive to light.  Neck:     Comments: No meningismus. Cardiovascular:     Rate and Rhythm: Normal rate and regular rhythm.     Heart sounds: Normal heart sounds. No murmur heard. Pulmonary:     Effort: Pulmonary effort is normal. No respiratory distress.     Breath sounds: Normal breath sounds. No wheezing.  Abdominal:     Palpations: Abdomen is soft.     Tenderness: There is no abdominal tenderness. There is no guarding or rebound.  Musculoskeletal:        General: No tenderness. Normal range of motion.     Cervical back: Normal range of motion and neck supple.  Skin:    General: Skin is warm.  Neurological:     Mental Status: She is alert  and oriented to person, place, and time.     Cranial Nerves: No cranial nerve deficit.     Motor: No abnormal muscle tone.     Coordination: Coordination normal.     Comments:  5/5 strength throughout. CN 2-12 intact.Equal grip strength.   Psychiatric:        Behavior: Behavior normal.     ED Results / Procedures / Treatments   Labs (all labs ordered are listed, but only abnormal results are displayed) Labs Reviewed  RESP PANEL BY RT-PCR (RSV, FLU A&B, COVID)  [id number]  EKG None  Radiology DG Chest Portable 1 View Result Date: 09/25/2023 CLINICAL DATA:  Cough EXAM: PORTABLE CHEST 1 VIEW COMPARISON:  03/15/2023 FINDINGS: Normal heart size and mediastinal contours. No acute infiltrate or edema. No effusion or pneumothorax. No acute osseous findings. IMPRESSION: No active disease. Electronically Signed   By: Dorn Roulette M.D.   On: 09/25/2023 07:01    Procedures Procedures    Medications Ordered in ED Medications  albuterol  (VENTOLIN  HFA) 108 (90 Base) MCG/ACT inhaler 2 puff (has no administration in time range)  HYDROcodone  bit-homatropine (HYCODAN) 5-1.5 MG/5ML syrup 5 mL (has no administration in time range)  benzonatate  (TESSALON ) capsule 100 mg (has no administration in time range)    ED Course/ Medical Decision Making/ A&P                                 Medical Decision Making Amount and/or Complexity of Data Reviewed Labs: ordered. Decision-making details documented in ED Course. Radiology: ordered and independent interpretation performed. Decision-making details documented in ED Course. ECG/medicine tests: ordered and independent interpretation performed. Decision-making details documented in ED Course.  Risk Prescription drug management.   5 days of dry cough with chest pain with coughing.  Comes in with intractable coughing but no hypoxia no wheezing.  No fever.  Lungs are clear without wheezing.  X-ray negative for infiltrate.  No hypoxia or increased  work of breathing.  COVID and flu swabs are sent and pending at discharge.  Will escalate her antitussive  regimen.  Low suspicion for ACS, PE, pneumonia, aortic dissection.  Return to the ED with new or worsening symptoms.  Avoid smoking..        Final Clinical Impression(s) / ED Diagnoses Final diagnoses:  Viral URI with cough    Rx / DC Orders ED Discharge Orders     None         Angello Chien, Garnette, MD 09/25/23 [phone]

## 2024-01-13 ENCOUNTER — Encounter (HOSPITAL_COMMUNITY): Payer: Self-pay

## 2024-01-13 ENCOUNTER — Other Ambulatory Visit: Payer: Self-pay

## 2024-01-13 ENCOUNTER — Emergency Department (HOSPITAL_COMMUNITY)
Admission: EM | Admit: 2024-01-13 | Discharge: 2024-01-13 | Disposition: A | Discharge status: Home / Self Care | Attending: Emergency Medicine | Admitting: Emergency Medicine

## 2024-01-13 DIAGNOSIS — M545 Low back pain, unspecified: Secondary | ICD-10-CM

## 2024-01-13 DIAGNOSIS — M62830 Muscle spasm of back: Secondary | ICD-10-CM | POA: Diagnosis not present

## 2024-01-13 MED ORDER — CYCLOBENZAPRINE HCL 10 MG PO TABS: 10.0000 mg | ORAL_TABLET | Freq: Three times a day (TID) | ORAL | 0 refills | Status: AC | PRN

## 2024-01-13 MED ORDER — KETOROLAC TROMETHAMINE 30 MG/ML IJ SOLN
30.0000 mg | Freq: Once | INTRAMUSCULAR | Status: AC
  Administered 2024-01-13: 30 mg via INTRAMUSCULAR
  Filled 2024-01-13: qty 1

## 2024-01-13 MED ORDER — OXYCODONE-ACETAMINOPHEN 5-325 MG PO TABS
2.0000 | ORAL_TABLET | Freq: Once | ORAL | Status: AC
  Administered 2024-01-13: 2 via ORAL
  Filled 2024-01-13: qty 2

## 2024-01-13 NOTE — ED Provider Notes (Signed)
 [facility] Emergency Department Provider Note MRN:  [id number]  Arrival date & time: 01/13/24     Chief Complaint   Back Pain   History of Present Illness   Amanda [name] is a 23 y.o. year-old female presents to the ED with chief complaint of low back pain.  She states that she had been doing a lot of work in her apartment bending over and reaching overhead earlier today.  She states that she felt her low back tighten up this evening.  She states that it now hurts to walk.  She denies pain that radiates down her legs.  Denies any bowel or bladder incontinence.  She denies weakness in her lower extremities.  Denies numbness or tingling.  She has not taken anything for symptoms.  She denies pregnancy or breast-feeding.  History provided by patient.   Review of Systems  Pertinent positive and negative review of systems noted in HPI.    Physical Exam   Vitals:   01/13/24 0251  BP: 130/84  Pulse: 83  Resp: 18  Temp: 98 F (36.7 C)  SpO2: 97%    CONSTITUTIONAL:  non toxic-appearing, NAD NEURO:  Alert and oriented x 3, CN 3-12 grossly intact EYES:  eyes equal and reactive ENT/NECK:  Supple, no stridor  CARDIO:  normal rate, appears well-perfused  PULM:  No respiratory distress, GI/GU:  non-distended,  MSK/SPINE:  No gross deformities, no edema, moves all extremities, lumbar paraspinal muscles are tight and tender SKIN:  no rash, atraumatic   *Additional and/or pertinent findings included in MDM below  Diagnostic and Interventional Summary    EKG Interpretation Date/Time:    Ventricular Rate:    PR Interval:    QRS Duration:    QT Interval:    QTC Calculation:   R Axis:      Text Interpretation:         Labs Reviewed - No data to display  No orders to display    Medications  ketorolac  (TORADOL ) 30 MG/ML injection 30 mg (30 mg Intramuscular Given 01/13/24 0330)  oxyCODONE -acetaminophen  (PERCOCET/ROXICET) 5-325 MG per tablet 2 tablet  (2 tablets Oral Given 01/13/24 0329)     Procedures  /  Critical Care Procedures  ED Course and Medical Decision Making  I have reviewed the triage vital signs, the nursing notes, and pertinent available records from the EMR.  Social Determinants Affecting Complexity of Care: Patient has no clinically significant social determinants affecting this chief complaint..   ED Course: Clinical Course as of 01/13/24 0418  Fri Jan 13, 2024  0418 Patient ambulates to the bathroom with steady gait.  States that she is feeling much better. [RB]    Clinical Course User Index [RB] Sherel Dikes, PA-C    Medical Decision Making Patient with back pain.    No neurological deficits and normal neuro exam.  Patient is ambulatory.  No loss of bowel or bladder control.  Doubt cauda equina.  Denies fever,  doubt epidural abscess or other lesion. Recommend back exercises, stretching, RICE, and will treat with a short course of flexeril .   Review of prior charts show no recent visits for back pain.     Risk Prescription drug management.         Consultants: No consultations were needed in caring for this patient.   Treatment and Plan: Emergency department workup does not suggest an emergent condition requiring admission or immediate intervention beyond  what has been performed at this time. The patient  is safe for discharge and has  been instructed to return immediately for worsening symptoms, change in  symptoms or any other concerns    Final Clinical Impressions(s) / ED Diagnoses     ICD-10-CM   1. Muscle spasm of back  M62.830     2. Acute midline low back pain without sciatica  M54.50       ED Discharge Orders          Ordered    cyclobenzaprine (FLEXERIL) 10 MG tablet  3 times daily PRN        01/13/24 0417              Discharge Instructions Discussed with and Provided to Patient:   Discharge Instructions   None      Sherel Dikes, PA-C 01/13/24  0419    Eldon Greenland, MD 01/13/24 [phone]

## 2024-01-13 NOTE — ED Triage Notes (Signed)
 BIB EMS from home, for mid to low back pain since 1800 yesterday. Pt denies trauma or injury. Sitting gives relief, shooting pain when moving around. Took muscle relaxer at 1900 last night with no relief. Hx of chronic back pain, but never this bad.

## 2024-01-13 NOTE — ED Notes (Signed)
 Gave pt water RN made aware

## 2024-01-13 NOTE — ED Notes (Signed)
 Pt ambulatory to lobby to wait for safe transport ride home.

## 2024-01-13 NOTE — ED Notes (Signed)
 Pt ambulatory with steady gait to bathroom

## 2024-01-30 ENCOUNTER — Encounter (HOSPITAL_BASED_OUTPATIENT_CLINIC_OR_DEPARTMENT_OTHER): Payer: Self-pay

## 2024-02-08 ENCOUNTER — Encounter: Payer: Self-pay | Admitting: Pulmonary Disease

## 2024-02-08 ENCOUNTER — Ambulatory Visit (INDEPENDENT_AMBULATORY_CARE_PROVIDER_SITE_OTHER): Admitting: Pulmonary Disease

## 2024-02-08 VITALS — BP 117/83 | HR 88 | Ht 64.0 in | Wt 250.6 lb

## 2024-02-08 DIAGNOSIS — G4733 Obstructive sleep apnea (adult) (pediatric): Secondary | ICD-10-CM | POA: Diagnosis not present

## 2024-02-08 DIAGNOSIS — F319 Bipolar disorder, unspecified: Secondary | ICD-10-CM

## 2024-02-08 DIAGNOSIS — E66813 Obesity, class 3: Secondary | ICD-10-CM

## 2024-02-08 DIAGNOSIS — F0630 Mood disorder due to known physiological condition, unspecified: Secondary | ICD-10-CM

## 2024-02-08 DIAGNOSIS — Z87891 Personal history of nicotine dependence: Secondary | ICD-10-CM

## 2024-02-08 DIAGNOSIS — R0683 Snoring: Secondary | ICD-10-CM

## 2024-02-08 DIAGNOSIS — F32A Depression, unspecified: Secondary | ICD-10-CM

## 2024-02-08 DIAGNOSIS — F840 Autistic disorder: Secondary | ICD-10-CM

## 2024-02-08 DIAGNOSIS — F4310 Post-traumatic stress disorder, unspecified: Secondary | ICD-10-CM

## 2024-02-08 NOTE — Progress Notes (Signed)
 Amanda [name]    [id number]    01-Jun-2001  Primary Care Physician:Vanstory, Alison Applebaum, PA  Referring Physician: Dianah Fort, PA [address] [place],  [place] [address]  Chief complaint:   Patient being seen for snoring daytime fatigue  HPI:  Snoring, daytime fatigue Worsening over time Weight has been relatively stable  she goes to bed about 830, will not finally fall asleep until about midnight Wakes up around 6 AM  Prior to recently will go to bed by 11 and wake up about midday  Does wake up with a dry mouth, occasional headaches in the morning Occasional night sweats Gasping respirations at night  May possibly have sleep-related eating, sleep talking, sleep walking-according to does not happen frequently  Does not know whether she has a family history of sleep apnea  Smoked in the past but not heavily quit in 2021  Has history of hypertension, PTSD, bipolar disorder, autism spectrum, ADHD  She is on metformin   She will be going to the weight loss clinic, hopefully may be able to get GLP-1 agonist  Outpatient Encounter Medications as of 02/08/2024  Medication Sig   ABILIFY  MAINTENA 400 MG PRSY prefilled syringe Inject 1 mL (200 mg total) into the muscle every 30 (thirty) days. (Due on 06-27-20): For mood control   ABILIFY  MAINTENA 400 MG SRER injection Inject 1 mL (200 mg total) into the muscle every 30 (thirty) days. (Due on 06-27-20): For mood control   ARIPiprazole  (ABILIFY ) 10 MG tablet Take 10 mg by mouth daily.   ARIPiprazole  (ABILIFY ) 15 MG tablet Take 15 mg by mouth daily.   benzonatate  (TESSALON ) 100 MG capsule Take 100 mg by mouth every 8 (eight) hours.   ciprofloxacin  (CIPRO ) 500 MG tablet Take 500 mg by mouth 2 (two) times daily.   citalopram  (CELEXA ) 20 MG tablet Take 20 mg by mouth daily.   clindamycin (CLEOCIN) 300 MG capsule Take 300 mg by mouth every 6 (six) hours.   cyclobenzaprine  (FLEXERIL ) 10 MG tablet Take 1 tablet (10 mg  total) by mouth 3 (three) times daily as needed for muscle spasms.   doxycycline  (VIBRAMYCIN ) 100 MG capsule Take 1 capsule (100 mg total) by mouth 2 (two) times daily.   fluconazole  (DIFLUCAN ) 150 MG tablet Take 1 tablet (150 mg total) by mouth daily.   lithium  300 MG tablet Take 300 mg by mouth 2 (two) times daily.   Olopatadine  HCl 0.2 % SOLN Apply 1 drop to eye daily.   ondansetron  (ZOFRAN -ODT) 4 MG disintegrating tablet Take 1 tablet (4 mg total) by mouth every 8 (eight) hours as needed for nausea or vomiting.   predniSONE  (DELTASONE ) 10 MG tablet Take by mouth.   QUEtiapine (SEROQUEL) 25 MG tablet Take 25 mg by mouth 2 (two) times daily as needed.   tiZANidine (ZANAFLEX) 4 MG tablet Take 4 mg by mouth 2 (two) times daily.   traMADol  (ULTRAM ) 50 MG tablet Take 1 tablet (50 mg total) by mouth every 6 (six) hours as needed.   VENTOLIN  HFA 108 (90 Base) MCG/ACT inhaler INHALE TWO PUFFS INTO THE LUNGS EVERY 4 HOURS AS NEEDED FOR WHEEZING AND SHORTNESS OF BREATH   [DISCONTINUED] fluticasone  (FLONASE ) 50 MCG/ACT nasal spray Place 2 sprays into both nostrils daily. (Patient not taking: Reported on 08/01/2019)   [DISCONTINUED] loratadine  (CLARITIN ) 10 MG tablet Take 1 tablet (10 mg total) by mouth daily. (Patient not taking: Reported on 05/28/2019)   No facility-administered  encounter medications on file as of 02/08/2024.    Allergies as of 02/08/2024 - Review Complete 02/08/2024  Allergen Reaction Noted   Poison ivy extract [poison ivy extract] Hives, Itching, and Rash 11/01/2013   Sulfa antibiotics Itching, Swelling, Rash, and Other (See Comments) 11/01/2013   Bee pollen  08/22/2022   Fluoxetine Hives 05/06/2023   Molds & smuts  08/22/2022   Latex Rash 05/27/2023   Wound dressing adhesive Rash 05/18/2023    Past Medical History:  Diagnosis Date   Asthma    Attention deficit hyperactivity disorder    Autism    Depression    Eczema    H/O hidradenitis suppurativa    Heart murmur     Mood disorder (HCC)    Oppositional defiant disorder     Past Surgical History:  Procedure Laterality Date   ADENOIDECTOMY     BREAST CYST EXCISION Left 05/27/2023   Procedure: Left breast cyst excision;  Surgeon: Teretha Ferguson, MD;  Location: [facility];  Service: Plastics;  Laterality: Left;   TYMPANOSTOMY TUBE PLACEMENT      Family History  Problem Relation Age of Onset   Food Allergy  Sister     Social History   Socioeconomic History   Marital status: Single    Spouse name: Not on file   Number of children: Not on file   Years of education: Not on file   Highest education level: Not on file  Occupational History   Not on file  Tobacco Use   Smoking status: Never    Passive exposure: Current   Smokeless tobacco: Never  Vaping Use   Vaping status: Every Day   Substances: Nicotine, Flavoring  Substance and Sexual Activity   Alcohol use: No   Drug use: Yes    Types: Marijuana    Comment: > 24hrs   Sexual activity: Yes    Birth control/protection: Condom, I.U.D.  Other Topics Concern   Not on file  Social History Narrative   Not on file   Social Drivers of Health   Financial Resource Strain: Not on file  Food Insecurity: Not on file  Transportation Needs: Not on file  Physical Activity: Not on file  Stress: Not on file  Social Connections: Unknown (01/31/2022)   Received from [facility]   Social Network    Social Network: Not on file  Intimate Partner Violence: Unknown (12/23/2021)   Received from [facility]   HITS    Physically Hurt: Not on file    Insult or Talk Down To: Not on file    Threaten Physical Harm: Not on file    Scream or Curse: Not on file    Review of Systems  Respiratory:  Positive for choking.   Psychiatric/Behavioral:  Positive for sleep disturbance.     Vitals:   02/08/24 1414  BP: 117/83  Pulse: 88  SpO2: 98%     Physical Exam Constitutional:      Appearance: She is  obese.  HENT:     Head: Normocephalic.     Nose: Nose normal.     Mouth/Throat:     Mouth: Mucous membranes are moist.  Eyes:     General: No scleral icterus.    Pupils: Pupils are equal, round, and reactive to light.  Cardiovascular:     Rate and Rhythm: Regular rhythm.     Heart sounds: No murmur heard.    No friction rub.  Pulmonary:  Effort: No respiratory distress.     Breath sounds: No stridor. No wheezing or rhonchi.  Musculoskeletal:     Cervical back: Normal range of motion. No rigidity or tenderness.  Neurological:     Mental Status: She is alert.  Psychiatric:        Mood and Affect: Mood normal.     Epworth Sleepiness Scale of 7  Data Reviewed: Chest x-ray September 21, 2023 reviewed showing no acute infiltrate  No previous sleep study on record  Most recent spirometry was in September 2023 with some suggestion of restriction  Assessment:  High probability for significant obstructive sleep apnea Body habitus concerning for sleep-related hypoventilation  Class III obesity  Mood disorder Bipolar disorder PTSD, autism spectrum  Plan/Recommendations: Schedule patient for an in lab split-night study  Etiology of sleep disordered breathing discussed with patient Treatment options for sleep disordered breathing discussed with the patient  Will see patient again in about 3 months  Encouraged to continue weight loss efforts  Continue current medications     Myer Artis MD [facility] 02/08/2024, 2:56 PM  CC: Dianah Fort, PA

## 2024-02-08 NOTE — Patient Instructions (Signed)
 Schedule for an in lab split-night study  Continue weight loss efforts  Graded activities as tolerated  We will update you with results as soon as reviewed  Call us  with significant concerns  I will see you 3 months from here   Living With Sleep Apnea Sleep apnea is a condition that affects your breathing while you're sleeping. Your tongue or the tissue in your throat may block the flow of air while you sleep. You may have shallow breathing or stop breathing for short periods of time. The breaks in breathing interrupt the deep sleep that you need to feel rested. Even if you don't wake up from the gaps in breathing, you may feel tired during the day. People with sleep apnea may snore loudly. You may have a headache in the morning and feel anxious or depressed. How can sleep apnea affect me? Sleep apnea increases your chances of being very tired during the day. This is called daytime fatigue. Sleep apnea can also increase your risk of: Heart attack. Stroke. Obesity. Type 2 diabetes. Heart failure. Irregular heartbeat. High blood pressure. If you are very tired during the day, you may be more likely to: Not do well in school or at work. Fall asleep while driving. Have trouble paying attention. Develop depression or anxiety. Have problems having sex. This is called sexual dysfunction. What actions can I take to manage sleep apnea? Sleep apnea treatment  If you were given a device to open your airway while you sleep, use it only as told by your health care provider. You may be given: An oral appliance. This is a mouthpiece that shifts your lower jaw forward. A continuous positive airway pressure (CPAP) device. This blows air through a mask. A nasal expiratory positive airway pressure (EPAP) device. This has valves that you put into each nostril. A bi-level positive airway pressure (BIPAP) device. This blows air through a mask when you breathe in and breathe out. You may need  surgery if other treatments don't work for you. Sleep habits Go to sleep and wake up at the same time every day. This helps set your internal clock for sleeping. If you stay up later than usual on weekends, try to get up in the morning within 2 hours of the time you usually wake up. Try to get at least 7-9 hours of sleep each night. Stop using a computer, tablet, and mobile phone a few hours before bedtime. Do not take long naps during the day. If you nap, limit it to 30 minutes. Have a relaxing bedtime routine. Reading or listening to music may relax you and help you sleep. Use your bedroom only for sleep. Keep your television and computer out of your bedroom. Keep your bedroom cool, dark, and quiet. Use a supportive mattress and pillows. Follow your provider's instructions for other changes to sleep habits. Nutrition Do not eat big meals in the evening. Do not have caffeine in the later part of the day. The effects of caffeine can last for more than 5 hours. Follow your provider's instructions for any changes to what you eat and drink. Lifestyle Do not drink alcohol before bedtime. Alcohol can cause you to fall asleep at first, but then it can cause you to wake up in the middle of the night and have trouble getting back to sleep. Do not smoke, vape, or use nicotine or tobacco. Medicines Take over-the-counter and prescription medicines only as told by your provider. Do not use over-the-counter sleep medicine. You may  become dependent on this medicine, and it can make sleep apnea worse. Do not take medicines, such as sedatives and narcotics, unless told to by your provider. Activity Exercise on most days, but avoid exercising in the evening. Exercising near bedtime can interfere with sleeping. If possible, spend time outside every day. Natural light helps with your internal clock. General information Lose weight if you need to. Stay at a healthy weight. If you are having surgery, make  sure to tell your provider that you have sleep apnea. You may need to bring your device with you. Keep all follow-up visits. Your provider will want to check on your condition. Where to find more information National [employer], [employer], [employer]: [employer] This information is not intended to replace advice given to you by your health care provider. Make sure you discuss any questions you have with your health care provider. Document Revised: 12/29/2022 Document Reviewed: 12/29/2022 Elsevier Patient Education  2024 [employer].

## 2024-02-28 ENCOUNTER — Emergency Department (HOSPITAL_COMMUNITY)

## 2024-02-28 ENCOUNTER — Encounter (HOSPITAL_COMMUNITY): Payer: Self-pay

## 2024-02-28 ENCOUNTER — Emergency Department (HOSPITAL_COMMUNITY)
Admission: EM | Admit: 2024-02-28 | Discharge: 2024-02-29 | Disposition: A | Discharge status: Home / Self Care | Attending: Emergency Medicine | Admitting: Emergency Medicine

## 2024-02-28 ENCOUNTER — Other Ambulatory Visit: Payer: Self-pay

## 2024-02-28 DIAGNOSIS — M25512 Pain in left shoulder: Secondary | ICD-10-CM | POA: Insufficient documentation

## 2024-02-28 DIAGNOSIS — Z91040 Latex allergy status: Secondary | ICD-10-CM | POA: Insufficient documentation

## 2024-02-28 DIAGNOSIS — W2209XA Striking against other stationary object, initial encounter: Secondary | ICD-10-CM | POA: Insufficient documentation

## 2024-02-28 LAB — POC URINE PREG, ED: Preg Test, Ur: NEGATIVE

## 2024-02-28 NOTE — Discharge Instructions (Signed)
 Your x-rays today were reassuring.  You may use the shoulder sling for comfort.  As discussed you may continue to use Naprosyn  and acetaminophen  for pain control at home.  You may also ice the area.  Follow-up with orthopedics for further evaluation.  If you develop any life-threatening symptoms return to the emergency department.

## 2024-02-28 NOTE — ED Provider Notes (Signed)
  EMERGENCY DEPARTMENT AT [facility] Provider Note   CSN: [id number] Arrival date & time: 02/28/24  2147     History  Chief Complaint  Patient presents with   Shoulder Pain    Amanda [name] is a 23 y.o. female.  Patient presents to the emergency room complaining left-sided shoulder pain.  She states that she was roughhousing with her younger brother last night.  He bumped into her and knocked her into the wall, hitting directly on the left shoulder.  She took Naprosyn  earlier which helped minimally with pain relief.  She denies any other injuries.  Past medical history significant for oppositional defiant disorder, disruptive mood dysregulation disorder, PTSD, bipolar 1 disorder   Shoulder Pain      Home Medications Prior to Admission medications   Medication Sig Start Date End Date Taking? Authorizing Provider  ABILIFY  MAINTENA 400 MG PRSY prefilled syringe Inject 1 mL (200 mg total) into the muscle every 30 (thirty) days. (Due on 06-27-20): For mood control 06/30/23   [provider]  ABILIFY  MAINTENA 400 MG SRER injection Inject 1 mL (200 mg total) into the muscle every 30 (thirty) days. (Due on 06-27-20): For mood control 06/27/20   Asuncion Layer I, NP  ARIPiprazole  (ABILIFY ) 10 MG tablet Take 10 mg by mouth daily. 04/18/23   [provider]  ARIPiprazole  (ABILIFY ) 15 MG tablet Take 15 mg by mouth daily. 07/01/23   [provider]  benzonatate  (TESSALON ) 100 MG capsule Take 100 mg by mouth every 8 (eight) hours. 06/21/23   [provider]  ciprofloxacin  (CIPRO ) 500 MG tablet Take 500 mg by mouth 2 (two) times daily. 06/29/23   [provider]  citalopram  (CELEXA ) 20 MG tablet Take 20 mg by mouth daily. 03/04/23   [provider]  clindamycin (CLEOCIN) 300 MG capsule Take 300 mg by mouth every 6 (six) hours. 01/19/23   [provider]  cyclobenzaprine  (FLEXERIL ) 10 MG tablet Take 1 tablet (10 mg  total) by mouth 3 (three) times daily as needed for muscle spasms. 01/13/24   Sherel Dikes, PA-C  doxycycline  (VIBRAMYCIN ) 100 MG capsule Take 1 capsule (100 mg total) by mouth 2 (two) times daily. 09/25/23   Rancour, Mara Seminole, MD  fluconazole  (DIFLUCAN ) 150 MG tablet Take 1 tablet (150 mg total) by mouth daily. 09/25/23   Rancour, Mara Seminole, MD  lithium  300 MG tablet Take 300 mg by mouth 2 (two) times daily. 04/11/23   [provider]  Olopatadine  HCl 0.2 % SOLN Apply 1 drop to eye daily. 06/10/22   Brian Campanile, MD  ondansetron  (ZOFRAN -ODT) 4 MG disintegrating tablet Take 1 tablet (4 mg total) by mouth every 8 (eight) hours as needed for nausea or vomiting. 05/17/23   Jhonnie Mosher, PA-C  predniSONE  (DELTASONE ) 10 MG tablet Take by mouth. 06/21/23   [provider]  QUEtiapine (SEROQUEL) 25 MG tablet Take 25 mg by mouth 2 (two) times daily as needed. 04/18/23   [provider]  tiZANidine (ZANAFLEX) 4 MG tablet Take 4 mg by mouth 2 (two) times daily. 03/15/23   [provider]  traMADol  (ULTRAM ) 50 MG tablet Take 1 tablet (50 mg total) by mouth every 6 (six) hours as needed. 07/18/23   Schutt, Coni Deep, PA-C  VENTOLIN  HFA 108 (90 Base) MCG/ACT inhaler INHALE TWO PUFFS INTO THE LUNGS EVERY 4 HOURS AS NEEDED FOR WHEEZING AND SHORTNESS OF BREATH 12/06/22   Brian Campanile, MD  fluticasone  (FLONASE ) 50 MCG/ACT  nasal spray Place 2 sprays into both nostrils daily. Patient not taking: Reported on 08/01/2019 12/15/18 08/01/19  Evone Hoh, MD  loratadine  (CLARITIN ) 10 MG tablet Take 1 tablet (10 mg total) by mouth daily. Patient not taking: Reported on 05/28/2019 12/15/18 08/01/19  Evone Hoh, MD      Allergies    Poison ivy extract [poison ivy extract], Sulfa antibiotics, Bee pollen, Fluoxetine, Molds & smuts, Latex, and Wound dressing adhesive    Review of Systems   Review of Systems  Physical Exam Updated Vital Signs BP (!) 145/107    Pulse 99   Temp 98.2 F (36.8 C) (Oral)   Resp 16   SpO2 99%  Physical Exam Vitals and nursing note reviewed.  HENT:     Head: Normocephalic and atraumatic.  Eyes:     Pupils: Pupils are equal, round, and reactive to light.  Pulmonary:     Effort: Pulmonary effort is normal. No respiratory distress.  Musculoskeletal:        General: No tenderness or signs of injury. Normal range of motion.     Cervical back: Normal range of motion.     Comments: Grossly normal range of motion of the left shoulder.  Patient complains of pain with resisted flexion and resisted internal rotation.  Sensation intact.  Skin:    General: Skin is dry.  Neurological:     Mental Status: She is alert.  Psychiatric:        Mood and Affect: Mood normal.        Speech: Speech normal.        Behavior: Behavior normal.     ED Results / Procedures / Treatments   Labs (all labs ordered are listed, but only abnormal results are displayed) Labs Reviewed  POC URINE PREG, ED    EKG None  Radiology DG Shoulder Left Result Date: 02/28/2024 CLINICAL DATA:  pain EXAM: LEFT SHOULDER - 2+ VIEW COMPARISON:  None Available. FINDINGS: There is no evidence of fracture or dislocation. There is no evidence of arthropathy or other focal bone abnormality. Soft tissues are unremarkable. IMPRESSION: Negative. Electronically Signed   By: Morgane  Naveau M.D.   On: 02/28/2024 22:19    Procedures .Ortho Injury Treatment  Date/Time: 02/28/2024 11:17 PM  Performed by: Elisa Guest, PA-C Authorized by: Elisa Guest, PA-C   Consent:    Consent obtained:  Verbal   Consent given by:  PatientInjury location: shoulder Location details: left shoulder Injury type: soft tissue Pre-procedure neurovascular assessment: neurovascularly intact Immobilization: sling Splint Applied by: ED Nurse Post-procedure neurovascular assessment: post-procedure neurovascularly intact       Medications Ordered in ED Medications -  No data to display  ED Course/ Medical Decision Making/ A&P                                 Medical Decision Making  This patient presents to the ED for concern of shoulder pain, this involves an extensive number of treatment options, and is a complaint that carries with it a high risk of complications and morbidity.  The differential diagnosis includes fracture, dislocation, soft tissue injury, others   Co morbidities / Chronic conditions that complicate the patient evaluation  Mood disorders   Additional history obtained:  Additional history obtained from EMR   Lab Tests:  I Ordered, and personally interpreted labs.  The pertinent results include: Negative pregnancy test   Imaging Studies ordered:  I ordered imaging studies including plain films of the left shoulder I independently visualized and interpreted imaging which showed no acute findings I agree with the radiologist interpretation   Test / Admission - Considered:  Patient with no acute findings on imaging.  No dislocation or fracture.  She does have pain with certain range of motion of the left shoulder.  Will provide shoulder sling and recommend Naprosyn  and Tylenol  at home for pain control.  I discussed icing the area with the patient.  Patient to follow-up with orthopedics for further management as needed.  Return precautions provided.  No indication for admission.         Final Clinical Impression(s) / ED Diagnoses Final diagnoses:  Acute pain of left shoulder    Rx / DC Orders ED Discharge Orders     None         Delories Fetter 02/28/24 2319    Eldon Greenland, MD 02/29/24 [phone]

## 2024-02-28 NOTE — ED Triage Notes (Signed)
 Pt. Arrives for left shoulder pain. States that she was play with her brother and got slammed into a wall on her left side. Has been taking naproxen . States that it is still hurting. Has full ROM to the left shoulder.

## 2024-03-05 ENCOUNTER — Telehealth: Payer: Self-pay

## 2024-03-05 NOTE — Telephone Encounter (Signed)
 Copied from CRM [id number]. Topic: Clinical - Request for Lab/Test Order >> Mar 02, 2024 11:40 AM Hilton Lucky wrote: Reason for CRM: Patient is calling to check in on the split night study in-lab per last office visit. Patient has not heard to schedule sleep study yet and would like to be scheduled at this time. Patient's previous note states to have one done but order does not appear to be attached to note if it is in the system.

## 2024-03-07 NOTE — Telephone Encounter (Signed)
 Copied from CRM [id number]. Topic: Appointments - Scheduling Inquiry for Clinic >> Mar 07, 2024  8:52 AM Amanda [name] wrote: Reason for CRM: Patient [phone] has not received a call for scheduling a lab split-night study. Per CAL, [facility] will contact patient and send crm to admin. Patient verbalized understanding and will wait for the call back.   No in lab sleep study order in the pts chart   Please advise so we can sch

## 2024-03-12 ENCOUNTER — Other Ambulatory Visit: Payer: Self-pay | Admitting: Pulmonary Disease

## 2024-03-12 DIAGNOSIS — E662 Morbid (severe) obesity with alveolar hypoventilation: Secondary | ICD-10-CM

## 2024-03-12 DIAGNOSIS — R0683 Snoring: Secondary | ICD-10-CM

## 2024-03-20 NOTE — Telephone Encounter (Signed)
 Patient has been scheduled. NFN

## 2024-04-11 ENCOUNTER — Ambulatory Visit: Admitting: Pulmonary Disease

## 2024-05-16 ENCOUNTER — Ambulatory Visit: Admitting: Pulmonary Disease

## 2024-05-30 ENCOUNTER — Other Ambulatory Visit: Payer: Self-pay | Admitting: Obstetrics and Gynecology

## 2024-05-30 ENCOUNTER — Emergency Department (HOSPITAL_COMMUNITY)
Admission: EM | Admit: 2024-05-30 | Discharge: 2024-05-31 | Disposition: A | Discharge status: Home / Self Care | Attending: Emergency Medicine | Admitting: Emergency Medicine

## 2024-05-30 ENCOUNTER — Encounter (HOSPITAL_COMMUNITY): Payer: Self-pay

## 2024-05-30 DIAGNOSIS — Z91040 Latex allergy status: Secondary | ICD-10-CM | POA: Diagnosis not present

## 2024-05-30 DIAGNOSIS — R42 Dizziness and giddiness: Secondary | ICD-10-CM | POA: Diagnosis present

## 2024-05-30 DIAGNOSIS — N6324 Unspecified lump in the left breast, lower inner quadrant: Secondary | ICD-10-CM

## 2024-05-30 DIAGNOSIS — F840 Autistic disorder: Secondary | ICD-10-CM | POA: Diagnosis not present

## 2024-05-30 LAB — COMPREHENSIVE METABOLIC PANEL WITH GFR
ALT: 17 U/L (ref 0–44)
AST: 25 U/L (ref 15–41)
Albumin: 4.2 g/dL (ref 3.5–5.0)
Alkaline Phosphatase: 53 U/L (ref 38–126)
Anion gap: 11 (ref 5–15)
BUN: 12 mg/dL (ref 6–20)
CO2: 22 mmol/L (ref 22–32)
Calcium: 9.8 mg/dL (ref 8.9–10.3)
Chloride: 105 mmol/L (ref 98–111)
Creatinine, Ser: 0.7 mg/dL (ref 0.44–1.00)
GFR, Estimated: >60 mL/min (ref >60)
Glucose, Bld: 108 mg/dL — ABNORMAL HIGH (ref 70–99)
Potassium: 4.3 mmol/L (ref 3.5–5.1)
Sodium: 138 mmol/L (ref 135–145)
Total Bilirubin: 0.3 mg/dL (ref 0.0–1.2)
Total Protein: 6.7 g/dL (ref 6.5–8.1)

## 2024-05-30 LAB — CBC
HCT: 38.3 % (ref 36.0–46.0)
Hemoglobin: 12.6 g/dL (ref 12.0–15.0)
MCH: 29.1 pg (ref 26.0–34.0)
MCHC: 32.9 g/dL (ref 30.0–36.0)
MCV: 88.5 fL (ref 80.0–100.0)
Platelets: 281 K/uL (ref 150–400)
RBC: 4.33 MIL/uL (ref 3.87–5.11)
RDW: 12.4 % (ref 11.5–15.5)
WBC: 12 K/uL — ABNORMAL HIGH (ref 4.0–10.5)
nRBC: 0 % (ref 0.0–0.2)

## 2024-05-30 LAB — CBG MONITORING, ED: Glucose-Capillary: 90 mg/dL (ref 70–99)

## 2024-05-30 MED ORDER — ONDANSETRON HCL 4 MG/2ML IJ SOLN
4.0000 mg | Freq: Once | INTRAMUSCULAR | Status: AC
  Administered 2024-05-30: 4 mg via INTRAVENOUS
  Filled 2024-05-30: qty 2

## 2024-05-30 MED ORDER — SODIUM CHLORIDE 0.9 % IV BOLUS
1000.0000 mL | Freq: Once | INTRAVENOUS | Status: AC
  Administered 2024-05-30: 1000 mL via INTRAVENOUS

## 2024-05-30 MED ORDER — MECLIZINE HCL 25 MG PO TABS: 25.0000 mg | ORAL_TABLET | Freq: Three times a day (TID) | ORAL | 0 refills | Status: DC | PRN

## 2024-05-30 MED ORDER — MECLIZINE HCL 25 MG PO TABS
25.0000 mg | ORAL_TABLET | Freq: Once | ORAL | Status: AC
  Administered 2024-05-30: 25 mg via ORAL
  Filled 2024-05-30: qty 1

## 2024-05-30 NOTE — ED Triage Notes (Signed)
 Pt BIB ems from home for dizziness that's been going on for 4 hours. Pt states not having enough water today. Mom has vertigo. Pt states having this type of dizziness before and was told it is vertigo. Pt also has smoked THC-A tonight 3 hours ago for her anxiety. Pt has had  2 episodes of vomiting before the THC-A, states it helped her vomiting. A&Ox4, VS stable

## 2024-05-30 NOTE — ED Provider Notes (Signed)
 [address] EMERGENCY DEPARTMENT AT [facility] Provider Note   CSN: [id number] Arrival date & time: 05/30/24  2047     Patient presents with: No chief complaint on file.   Amanda [name] is a 23 y.o. female.   The history is provided by the patient, medical records and the EMS personnel. No language interpreter was used.     23 year old female history of autism, ODD, ADD, DMDD, PTSD brought here via EMS from home for evaluation of dizziness.  Patient states she woke up this afternoon feeling dizzy.  She described as being both lightheadedness and room spinning sensation.  States she felt nauseous and vomited twice.  She feels dehydrated.  States she normally drinks plenty of fluid but felt like she did not drink enough fluid today.  She also admits to using THC earlier today but states this is something that she does on a regular basis.  Denies any recent alcohol use denies any recent sickness no runny nose sneezing or coughing.  She does endorse mild head throbbing sensation.  She is currently on her menstruation.  Prior to Admission medications   Medication Sig Start Date End Date Taking? Authorizing Provider  ABILIFY  MAINTENA 400 MG PRSY prefilled syringe Inject 1 mL (200 mg total) into the muscle every 30 (thirty) days. (Due on 06-27-20): For mood control 06/30/23   [provider]  ABILIFY  MAINTENA 400 MG SRER injection Inject 1 mL (200 mg total) into the muscle every 30 (thirty) days. (Due on 06-27-20): For mood control 06/27/20   Collene Gouge I, NP  ARIPiprazole  (ABILIFY ) 10 MG tablet Take 10 mg by mouth daily. 04/18/23   [provider]  ARIPiprazole  (ABILIFY ) 15 MG tablet Take 15 mg by mouth daily. 07/01/23   [provider]  benzonatate  (TESSALON ) 100 MG capsule Take 100 mg by mouth every 8 (eight) hours. 06/21/23   [provider]  ciprofloxacin  (CIPRO ) 500 MG tablet Take 500 mg by mouth 2 (two) times daily. 06/29/23   [provider]  citalopram  (CELEXA ) 20 MG tablet Take 20 mg by mouth daily. 03/04/23   [provider]  clindamycin (CLEOCIN) 300 MG capsule Take 300 mg by mouth every 6 (six) hours. 01/19/23   [provider]  cyclobenzaprine  (FLEXERIL ) 10 MG tablet Take 1 tablet (10 mg total) by mouth 3 (three) times daily as needed for muscle spasms. 01/13/24   Vicky Charleston, PA-C  doxycycline  (VIBRAMYCIN ) 100 MG capsule Take 1 capsule (100 mg total) by mouth 2 (two) times daily. 09/25/23   Rancour, Garnette, MD  fluconazole  (DIFLUCAN ) 150 MG tablet Take 1 tablet (150 mg total) by mouth daily. 09/25/23   Rancour, Garnette, MD  lithium  300 MG tablet Take 300 mg by mouth 2 (two) times daily. 04/11/23   [provider]  Olopatadine  HCl 0.2 % SOLN Apply 1 drop to eye daily. 06/10/22   Jeneal Danita Macintosh, MD  ondansetron  (ZOFRAN -ODT) 4 MG disintegrating tablet Take 1 tablet (4 mg total) by mouth every 8 (eight) hours as needed for nausea or vomiting. 05/17/23   Landy Honora CROME, PA-C  predniSONE  (DELTASONE ) 10 MG tablet Take by mouth. 06/21/23   [provider]  QUEtiapine (SEROQUEL) 25 MG tablet Take 25 mg by mouth 2 (two) times daily as needed. 04/18/23   [provider]  tiZANidine (ZANAFLEX) 4 MG tablet Take 4 mg by mouth 2 (two) times daily. 03/15/23   [provider]  traMADol  (ULTRAM ) 50 MG tablet Take 1  tablet (50 mg total) by mouth every 6 (six) hours as needed. 07/18/23   Schutt, Marsa HERO, PA-C  VENTOLIN  HFA 108 (90 Base) MCG/ACT inhaler INHALE TWO PUFFS INTO THE LUNGS EVERY 4 HOURS AS NEEDED FOR WHEEZING AND SHORTNESS OF BREATH 12/06/22   Jeneal Danita Macintosh, MD  fluticasone  (FLONASE ) 50 MCG/ACT nasal spray Place 2 sprays into both nostrils daily. Patient not taking: Reported on 08/01/2019 12/15/18 08/01/19  Carlyle Lenis, MD  loratadine  (CLARITIN ) 10 MG tablet Take 1 tablet (10 mg total) by mouth daily. Patient not taking: Reported on 05/28/2019  12/15/18 08/01/19  Carlyle Lenis, MD    Allergies: Poison ivy extract [poison ivy extract], Sulfa antibiotics, Bee pollen, Fluoxetine, Molds & smuts, Latex, and Wound dressing adhesive    Review of Systems  All other systems reviewed and are negative.   Updated Vital Signs BP (!) 133/101 (BP Location: Right Arm)   Pulse 85   Temp 98.1 F (36.7 C) (Oral)   Resp 13   Ht 5' 4 (1.626 m)   Wt 116.6 kg   SpO2 99%   BMI 44.11 kg/m   Physical Exam Vitals and nursing note reviewed.  Constitutional:      General: She is not in acute distress.    Appearance: She is well-developed. She is obese.  HENT:     Head: Atraumatic.     Right Ear: Tympanic membrane normal.     Left Ear: Tympanic membrane normal.  Eyes:     Extraocular Movements: Extraocular movements intact.     Conjunctiva/sclera: Conjunctivae normal.     Pupils: Pupils are equal, round, and reactive to light.     Comments: No nystagmus  Cardiovascular:     Rate and Rhythm: Normal rate and regular rhythm.     Pulses: Normal pulses.     Heart sounds: Normal heart sounds.  Pulmonary:     Effort: Pulmonary effort is normal.     Breath sounds: No wheezing, rhonchi or rales.  Abdominal:     Palpations: Abdomen is soft.     Tenderness: There is no abdominal tenderness.  Musculoskeletal:     Cervical back: Normal range of motion and neck supple. No rigidity or tenderness.     Comments: 5 out of 5 strength in all 4 extremities  Skin:    Findings: No rash.  Neurological:     Mental Status: She is alert and oriented to person, place, and time.  Psychiatric:        Mood and Affect: Mood normal.     (all labs ordered are listed, but only abnormal results are displayed) Labs Reviewed  COMPREHENSIVE METABOLIC PANEL WITH GFR - Abnormal; Notable for the following components:      Result Value   Glucose, Bld 108 (*)    All other components within normal limits  CBC - Abnormal; Notable for the following components:   WBC  12.0 (*)    All other components within normal limits  URINALYSIS, ROUTINE W REFLEX MICROSCOPIC  HCG, SERUM, QUALITATIVE  CBG MONITORING, ED    EKG: None  Date: 05/30/2024  Rate: 75  Rhythm: normal sinus rhythm  QRS Axis: normal  Intervals: normal  ST/T Wave abnormalities: normal  Conduction Disutrbances: none  Narrative Interpretation:   Old EKG Reviewed: No significant changes noted    Radiology: No results found.   Procedures   Medications Ordered in the ED  meclizine  (ANTIVERT ) tablet 25 mg (25 mg Oral Given 05/30/24 2239)  sodium chloride  0.9 %  bolus 1,000 mL (1,000 mLs Intravenous New Bag/Given 05/30/24 2244)  ondansetron  (ZOFRAN ) injection 4 mg (4 mg Intravenous Given 05/30/24 2245)    Clinical Course as of 05/30/24 2350  Wed May 30, 2024  2349 Waiting for urine pregnancy. Dizziness after smoking marijuana. On menstrual cycle. No imaging.  [CG]    Clinical Course User Index [CG] Ruthell Lonni FALCON, PA-C                                 Medical Decision Making Amount and/or Complexity of Data Reviewed Labs: ordered.  Risk Prescription drug management.   BP (!) 133/101 (BP Location: Right Arm)   Pulse 85   Temp 98.1 F (36.7 C) (Oral)   Resp 13   Ht 5' 4 (1.626 m)   Wt 116.6 kg   SpO2 99%   BMI 44.11 kg/m   50:57 PM 23 year old female history of autism, ODD, ADD, DMDD, PTSD brought here via EMS from home for evaluation of dizziness.  Patient states she woke up this afternoon feeling dizzy.  She described as being both lightheadedness and room spinning sensation.  States she felt nauseous and vomited twice.  She feels dehydrated.  States she normally drinks plenty of fluid but felt like she did not drink enough fluid today.  She also admits to using THC earlier today but states this is something that she does on a regular basis.  Denies any recent alcohol use denies any recent sickness no runny nose sneezing or coughing.  She does endorse mild head  throbbing sensation.  She is currently on her menstruation.  On exam, patient in no acute discomfort.  She does not have any visible nystagmus.  Ear exam unremarkable.  She has no nuchal rigidity concerning for meningitis.  Her strength are equal throughout.  She is mentating appropriately.  -Labs ordered, independently viewed and interpreted by me.  Labs remarkable for WBC 12.   -The patient was maintained on a cardiac monitor.  I personally viewed and interpreted the cardiac monitored which showed an underlying rhythm of: NSR -Imaging including brain MRI considered but felt dizziness is peripheral and pt is feeling better and able to ambulate -This patient presents to the ED for concern of dizziness, this involves an extensive number of treatment options, and is a complaint that carries with it a high risk of complications and morbidity.  The differential diagnosis includes dehydration, drug side effect, peripheral vertigo, infection, vestibular neuritis, metabolic derangement -Co morbidities that complicate the patient evaluation includes psychiatric illness -Treatment includes IVF, antivert , zofran  -Reevaluation of the patient after these medicines showed that the patient improved -PCP office notes or outside notes reviewed -Escalation to admission/observation considered: patients feels much better, is comfortable with discharge, and will follow up with PCP -Prescription medication considered, patient comfortable with meclizine , zofran  -Social Determinant of Health considered which includes tobacco use      Final diagnoses:  Dizziness    ED Discharge Orders          Ordered    meclizine  (ANTIVERT ) 25 MG tablet  3 times daily PRN        05/30/24 2344               Nivia Colon, PA-C 05/30/24 2350    Pamella Ozell LABOR, DO 06/07/24 [phone]

## 2024-05-30 NOTE — Discharge Instructions (Addendum)
 You have been evaluated for your symptoms.  Fortunately no concerning findings were noted on today exam.  You may take meclizine  as needed for nausea or dizziness.  Stay hydrated.  Follow up closely with your doctor for further care.

## 2024-05-31 ENCOUNTER — Encounter (HOSPITAL_COMMUNITY): Payer: Self-pay | Admitting: Radiology

## 2024-05-31 ENCOUNTER — Emergency Department (HOSPITAL_COMMUNITY)

## 2024-05-31 DIAGNOSIS — R42 Dizziness and giddiness: Secondary | ICD-10-CM | POA: Diagnosis not present

## 2024-05-31 LAB — URINALYSIS, ROUTINE W REFLEX MICROSCOPIC
Bilirubin Urine: NEGATIVE
Glucose, UA: NEGATIVE mg/dL
Ketones, ur: NEGATIVE mg/dL
Leukocytes,Ua: NEGATIVE
Nitrite: NEGATIVE
Protein, ur: NEGATIVE mg/dL
RBC / HPF: >50 RBC/hpf (ref 0–5)
Specific Gravity, Urine: 1.011 (ref 1.005–1.030)
pH: 5 (ref 5.0–8.0)

## 2024-05-31 LAB — HCG, SERUM, QUALITATIVE: Preg, Serum: NEGATIVE

## 2024-05-31 MED ORDER — IBUPROFEN 800 MG PO TABS
800.0000 mg | ORAL_TABLET | Freq: Once | ORAL | Status: AC
  Administered 2024-05-31: 800 mg via ORAL
  Filled 2024-05-31: qty 1

## 2024-05-31 MED ORDER — IOHEXOL 350 MG/ML SOLN
75.0000 mL | Freq: Once | INTRAVENOUS | Status: AC | PRN
  Administered 2024-05-31: 75 mL via INTRAVENOUS

## 2024-05-31 MED ORDER — ACETAMINOPHEN 500 MG PO TABS
1000.0000 mg | ORAL_TABLET | Freq: Once | ORAL | Status: AC
  Administered 2024-05-31: 1000 mg via ORAL
  Filled 2024-05-31: qty 2

## 2024-05-31 MED ORDER — DIAZEPAM 5 MG/ML IJ SOLN
5.0000 mg | Freq: Once | INTRAMUSCULAR | Status: AC
Start: 2024-05-31 — End: 2024-05-31
  Administered 2024-05-31: 5 mg via INTRAVENOUS
  Filled 2024-05-31: qty 2

## 2024-05-31 NOTE — ED Notes (Signed)
 Pt friend asked if pt symptoms could be attributed to her bad sleeping pattern/habits.

## 2024-05-31 NOTE — ED Notes (Signed)
 Assisted pt with changing into blue scrubs due to throwing up on her clothes, pt was ambulatory to the restroom

## 2024-05-31 NOTE — ED Provider Notes (Signed)
  Physical Exam  BP 123/84 (BP Location: Left Arm)   Pulse 72   Temp 97.7 F (36.5 C) (Oral)   Resp 16   Ht 5' 4 (1.626 m)   Wt 116.6 kg   LMP  (LMP Unknown)   SpO2 94%   BMI 44.11 kg/m   Physical Exam Neurological:     General: No focal deficit present.     GCS: GCS eye subscore is 4. GCS verbal subscore is 5. GCS motor subscore is 6.     Cranial Nerves: Cranial nerves 2-12 are intact. No cranial nerve deficit.     Sensory: Sensation is intact. No sensory deficit.     Motor: Motor function is intact. No weakness.     Gait: Gait is intact.     Comments: CN III through XII intact.  Intact finger-nose, heel-to-shin.  No pronator drift.  No slurred speech.  Equal strength throughout.  Equal sensation throughout.  PERRL.  Tracks cross midline.  Alert and oriented x 4.     Procedures  Procedures  ED Course / MDM   Clinical Course as of 05/31/24 0339  Wed May 30, 2024  2349 Waiting for urine pregnancy. Dizziness after smoking marijuana. On menstrual cycle. No imaging.  [CG]    Clinical Course User Index [CG] Ruthell Lonni FALCON, PA-C   Medical Decision Making Amount and/or Complexity of Data Reviewed Labs: ordered. Radiology: ordered.  Risk OTC drugs. Prescription drug management.   Patient signed out to me at shift change pending ECG.  Please see the previous provider note for further details.  In short, 23 year old female presenting with dizziness described as floating beginning today.  Patient apparently utilized marijuana earlier today but reports that this is common for her.  She was signed out to me pending hCG.  Per previous provider, patient symptoms had resolved.  Nursing staff notified me patient is continuing to complain of dizziness and a headache.  I reengaged with the patient and the patient reports that she has continued dizziness, headache.  She was given meclizine  but reports no relief of her dizziness.  CTA ordered at this time, patient given ibuprofen   and Tylenol  as well as Valium .  Patient was escorted to the bathroom and she ambulates with steady gait.  Update: Patient reports reduced dizziness after giving valium .  Her CTA is unremarkable.  She was ambulated and showed steady gait prior to discharge.  She will be discharged home with meclizine  at this time.  Advised to follow-up with her PCP.  She was given return precautions and she voiced understanding.  Stable to discharge.       Ruthell Lonni FALCON, PA-C 05/31/24 9660    Bari Charmaine FALCON, MD 06/02/24 2328

## 2024-06-05 ENCOUNTER — Ambulatory Visit (HOSPITAL_BASED_OUTPATIENT_CLINIC_OR_DEPARTMENT_OTHER): Admitting: Pulmonary Disease

## 2024-06-12 ENCOUNTER — Ambulatory Visit
Admission: RE | Admit: 2024-06-12 | Discharge: 2024-06-12 | Disposition: A | Discharge status: Home / Self Care | Source: Ambulatory Visit | Attending: Obstetrics and Gynecology | Admitting: Obstetrics and Gynecology

## 2024-06-12 DIAGNOSIS — N6324 Unspecified lump in the left breast, lower inner quadrant: Secondary | ICD-10-CM

## 2024-06-19 ENCOUNTER — Ambulatory Visit: Admitting: Primary Care

## 2024-07-11 ENCOUNTER — Emergency Department (HOSPITAL_COMMUNITY)
Admission: EM | Admit: 2024-07-11 | Discharge: 2024-07-11 | Disposition: A | Discharge status: Home / Self Care | Attending: Emergency Medicine | Admitting: Emergency Medicine

## 2024-07-11 ENCOUNTER — Emergency Department (HOSPITAL_COMMUNITY)

## 2024-07-11 ENCOUNTER — Other Ambulatory Visit: Payer: Self-pay

## 2024-07-11 DIAGNOSIS — Z91040 Latex allergy status: Secondary | ICD-10-CM | POA: Insufficient documentation

## 2024-07-11 DIAGNOSIS — R040 Epistaxis: Secondary | ICD-10-CM | POA: Diagnosis present

## 2024-07-11 DIAGNOSIS — R079 Chest pain, unspecified: Secondary | ICD-10-CM | POA: Diagnosis not present

## 2024-07-11 DIAGNOSIS — R030 Elevated blood-pressure reading, without diagnosis of hypertension: Secondary | ICD-10-CM | POA: Insufficient documentation

## 2024-07-11 DIAGNOSIS — R002 Palpitations: Secondary | ICD-10-CM | POA: Diagnosis not present

## 2024-07-11 LAB — BASIC METABOLIC PANEL WITH GFR
Anion gap: 5 (ref 5–15)
BUN: 10 mg/dL (ref 6–20)
CO2: 23 mmol/L (ref 22–32)
Calcium: 9 mg/dL (ref 8.9–10.3)
Chloride: 106 mmol/L (ref 98–111)
Creatinine, Ser: 0.8 mg/dL (ref 0.44–1.00)
GFR, Estimated: >60 mL/min (ref >60)
Glucose, Bld: 98 mg/dL (ref 70–99)
Potassium: 3.9 mmol/L (ref 3.5–5.1)
Sodium: 134 mmol/L — ABNORMAL LOW (ref 135–145)

## 2024-07-11 LAB — CBC WITH DIFFERENTIAL/PLATELET
Abs Immature Granulocytes: 0.04 K/uL (ref 0.00–0.07)
Basophils Absolute: 0 K/uL (ref 0.0–0.1)
Basophils Relative: 0 %
Eosinophils Absolute: 0.1 K/uL (ref 0.0–0.5)
Eosinophils Relative: 1 %
HCT: 39.3 % (ref 36.0–46.0)
Hemoglobin: 13.3 g/dL (ref 12.0–15.0)
Immature Granulocytes: 0 %
Lymphocytes Relative: 29 %
Lymphs Abs: 2.8 K/uL (ref 0.7–4.0)
MCH: 30 pg (ref 26.0–34.0)
MCHC: 33.8 g/dL (ref 30.0–36.0)
MCV: 88.5 fL (ref 80.0–100.0)
Monocytes Absolute: 0.7 K/uL (ref 0.1–1.0)
Monocytes Relative: 7 %
Neutro Abs: 6 K/uL (ref 1.7–7.7)
Neutrophils Relative %: 63 %
Platelets: 325 K/uL (ref 150–400)
RBC: 4.44 MIL/uL (ref 3.87–5.11)
RDW: 11.9 % (ref 11.5–15.5)
WBC: 9.6 K/uL (ref 4.0–10.5)
nRBC: 0 % (ref 0.0–0.2)

## 2024-07-11 LAB — TSH: TSH: 1.336 u[IU]/mL (ref 0.350–4.500)

## 2024-07-11 LAB — TROPONIN I (HIGH SENSITIVITY)
Troponin I (High Sensitivity): <2 ng/L (ref <18)
Troponin I (High Sensitivity): <2 ng/L (ref <18)

## 2024-07-11 NOTE — ED Provider Triage Note (Signed)
 Emergency Medicine Provider Triage Evaluation Note  Amanda [name] , a 23 y.o. female  was evaluated in triage.  Pt complains of chest pain, palpitations, nosebleed.  Has history of nosebleeds and typically happens when her blood pressure is elevated.  Currently wearing a Holter monitor  Review of Systems  Positive: As above Negative: As above  Physical Exam  BP (!) 143/100   Pulse (!) 103   Temp 98.2 F (36.8 C) (Oral)   Resp 18   Ht 5' 4 (1.626 m)   Wt 115.7 kg   SpO2 97%   BMI 43.77 kg/m  Gen:   Awake, no distress   Resp:  Normal effort  MSK:   Moves extremities without difficulty  Other:    Medical Decision Making  Medically screening exam initiated at 1:40 PM.  Appropriate orders placed.  Amanda [name] was informed that the remainder of the evaluation will be completed by another provider, this initial triage assessment does not replace that evaluation, and the importance of remaining in the ED until their evaluation is complete.    Amanda Loge, PA-C 07/11/24 1340

## 2024-07-11 NOTE — ED Triage Notes (Addendum)
 Pt had 3 nosebleeds today and also having heart palpitations and lightheadedness. Pt states like felt like something was sitting on chest earlier. Pt very anxious and shaky at triage. Pt has hx of anxiety and heart murmur.

## 2024-07-11 NOTE — ED Provider Notes (Signed)
 [place] [facility] Provider Note   CSN: [id number] Arrival date & time: 07/11/24  1312     Patient presents with: Palpitations, Dizziness, and Epistaxis   Amanda [name] is a 23 y.o. 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 Went to cardiologist yesterday and is on an adhesive heart monitor for 2 weeks to work up her heart flutter sensation. She woke up this morning with epistaxis and has had 3 total episodes today. Denies vision changes, headache, or sob     Palpitations Associated symptoms: dizziness   Dizziness Associated symptoms: palpitations   Epistaxis Associated symptoms: dizziness        Prior to Admission medications   Medication Sig Start Date End Date Taking? Authorizing Provider  ABILIFY  MAINTENA 400 MG PRSY prefilled syringe Inject 1 mL (200 mg total) into the muscle every 30 (thirty) days. (Due on 06-27-20): For mood control 06/30/23   [provider]  ABILIFY  MAINTENA 400 MG SRER injection Inject 1 mL (200 mg total) into the muscle every 30 (thirty) days. (Due on 06-27-20): For mood control Patient not taking: Reported on 05/30/2024 06/27/20   Collene Gouge I, NP  cyclobenzaprine  (FLEXERIL ) 10 MG tablet Take 1 tablet (10 mg total) by mouth 3 (three) times daily as needed for muscle spasms. Patient not taking: Reported on 05/30/2024 01/13/24   Vicky Charleston, PA-C  doxycycline  (VIBRAMYCIN ) 100 MG capsule Take 1 capsule (100 mg total) by mouth 2 (two) times daily. Patient not taking: Reported on 05/30/2024 09/25/23   Carita Senior, MD  fluconazole  (DIFLUCAN ) 150 MG tablet Take 1 tablet (150 mg total) by mouth daily. Patient not taking: Reported on 05/30/2024 09/25/23   Carita Senior, MD  ibuprofen  (ADVIL ) 600 MG tablet Take 600 mg by mouth every 6 (six) hours as needed.    [provider]  meclizine  (ANTIVERT ) 25 MG  tablet Take 1 tablet (25 mg total) by mouth 3 (three) times daily as needed for dizziness or nausea. 05/30/24   Nivia Colon, PA-C  metFORMIN (GLUCOPHAGE) 500 MG tablet Take 500 mg by mouth every evening.    [provider]  methocarbamol  (ROBAXIN ) 500 MG tablet Take 500 mg by mouth every 8 (eight) hours as needed. for 7 days    [provider]  naproxen  (NAPROSYN ) 500 MG tablet Take 500 mg by mouth 2 (two) times daily as needed.    [provider]  Olopatadine  HCl 0.2 % SOLN Apply 1 drop to eye daily. Patient not taking: Reported on 05/30/2024 06/10/22   Jeneal Danita Macintosh, MD  ondansetron  (ZOFRAN -ODT) 4 MG disintegrating tablet Take 1 tablet (4 mg total) by mouth every 8 (eight) hours as needed for nausea or vomiting. Patient not taking: Reported on 05/30/2024 05/17/23   Landy Honora CROME, PA-C  traMADol  (ULTRAM ) 50 MG tablet Take 1 tablet (50 mg total) by mouth every 6 (six) hours as needed. Patient not taking: Reported on 05/30/2024 07/18/23   Schutt, Alexander M, PA-C  VENTOLIN  HFA 108 (90 Base) MCG/ACT inhaler INHALE TWO PUFFS INTO THE LUNGS EVERY 4 HOURS AS NEEDED FOR WHEEZING AND SHORTNESS OF BREATH 12/06/22   Jeneal Danita Macintosh, MD  fluticasone  (FLONASE ) 50 MCG/ACT nasal spray Place 2 sprays into both nostrils daily. Patient not taking: Reported on 08/01/2019 12/15/18 08/01/19  Carlyle Lenis, MD  loratadine  (CLARITIN ) 10 MG tablet Take 1 tablet (10 mg total) by mouth daily. Patient  not taking: Reported on 05/28/2019 12/15/18 08/01/19  Carlyle Lenis, MD    Allergies: Poison ivy extract [poison ivy extract], Sulfa antibiotics, Bee pollen, Fluoxetine, Molds & smuts, Latex, and Wound dressing adhesive    Review of Systems  HENT:  Positive for nosebleeds.   Cardiovascular:  Positive for palpitations.  Neurological:  Positive for dizziness.    Updated Vital Signs BP (!) 141/94   Pulse 97   Temp 98.3 F (36.8 C) (Oral)   Resp 19   Ht 5' 4 (1.626 m)    Wt 115.7 kg   SpO2 100%   BMI 43.77 kg/m   Physical Exam Vitals and nursing note reviewed.  Constitutional:      General: She is not in acute distress.    Appearance: She is well-developed. She is not diaphoretic.  HENT:     Head: Normocephalic and atraumatic.     Right Ear: External ear normal.     Left Ear: External ear normal.     Nose: Nose normal.     Comments: There is some dried blood in the left naris no active bleeding no blood in the oropharynx    Mouth/Throat:     Mouth: Mucous membranes are moist.  Eyes:     General: No scleral icterus.    Conjunctiva/sclera: Conjunctivae normal.  Cardiovascular:     Rate and Rhythm: Normal rate and regular rhythm.     Heart sounds: Normal heart sounds. No murmur heard.    No friction rub. No gallop.  Pulmonary:     Effort: Pulmonary effort is normal. No respiratory distress.     Breath sounds: Normal breath sounds.  Chest:     Comments: Zio patch in place Abdominal:     General: Bowel sounds are normal. There is no distension.     Palpations: Abdomen is soft. There is no mass.     Tenderness: There is no abdominal tenderness. There is no guarding.  Musculoskeletal:     Cervical back: Normal range of motion.  Skin:    General: Skin is warm and dry.  Neurological:     Mental Status: She is alert and oriented to person, place, and time.  Psychiatric:        Behavior: Behavior normal.     (all labs ordered are listed, but only abnormal results are displayed) Labs Reviewed  BASIC METABOLIC PANEL WITH GFR - Abnormal; Notable for the following components:      Result Value   Sodium 134 (*)    All other components within normal limits  CBC WITH DIFFERENTIAL/PLATELET  TSH  TROPONIN I (HIGH SENSITIVITY)  TROPONIN I (HIGH SENSITIVITY)    EKG: None  Radiology: DG Chest 2 View Result Date: 07/11/2024 EXAM: 2 VIEW(S) XRAY OF THE CHEST 07/11/2024 02:02:13 PM COMPARISON: 09/25/2023 CLINICAL HISTORY: chest pain. Per triage  notes: Pt had 3 nosebleeds today and also having heart palpitations and lightheadedness. Pt states like felt like something was sitting on chest earlier. Pt very anxious and shaky at triage. Pt has hx of anxiety, asthma and heart murmur ; Smoker FINDINGS: LUNGS AND PLEURA: No focal pulmonary opacity. No pulmonary edema. No pleural effusion. No pneumothorax. HEART AND MEDIASTINUM: Battery device in left mid chest. No acute abnormality of the cardiac and mediastinal silhouettes. BONES AND SOFT TISSUES: No acute osseous abnormality. IMPRESSION: 1. No acute cardiopulmonary process. Electronically signed by: Waddell Calk MD 07/11/2024 02:35 PM EDT RP Workstation: [id number]     Procedures   Medications Ordered in  the ED - No data to display                                  Medical Decision Making Amount and/or Complexity of Data Reviewed Labs: ordered.   Send 23 year old female with a longstanding history of palpitations currently under observation by cardiology wearing a Zio patch.  She presented to the emergency department today for evaluation of her nosebleeds.  He does have a history of palpitations that was not her chief complaint today.  She notably has high blood pressure was concerned that potentially this could be causing her nose to bleed. Is not markedly elevated I have low suspicion that there is any correlation.  Reviewed labs ordered in triage.  She has no thrombocytopenia the remainder of her labs are otherwise reassuring.  I visualized interpreted two-view chest x-ray which shows no acute findings, EKG unremarkable.  Patient has no active nosebleed.  She states that she is unable to use Afrin because it causes severe headache.  At this time her vital signs have normalized.  I did order a TSH which is pending but she has no evidence of thyrotoxicosis.  Patient will be discharged at this time to follow closely with her PCP.  Nasal hygiene recommendations and epistaxis management  handouts given.  Discussed outpatient follow-up and return precautions.  She has no active chest pain or shortness of breath which concerns me for the need for workup for PE.    Final diagnoses:  None    ED Discharge Orders     None          Arloa Chroman, PA-C 07/11/24 1858    Tegeler, Lonni PARAS, MD 07/11/24 [phone]

## 2024-07-11 NOTE — ED Notes (Signed)
 Extra Blue top drawn

## 2024-07-11 NOTE — Discharge Instructions (Signed)
 Get help right away if: You have a nosebleed after a fall or a head injury. Your nosebleed does not go away after 20 minutes. You feel dizzy or weak. You have unusual bleeding from other parts of your body. You have unusual bruising on other parts of your body. You get sweaty. You vomit blood.  Nasal Hygiene The nose has many positive effects on the air you breathe in that you may not be aware of. - Temperature regulation - Filtration and removal of particulate matter - Humidification - Defense against infections There are several things you can do to help keep your nose healthy. Foremost is nasal hygiene. This will help with your nose's natural function and keep it moist and healthy. Techniques to accomplish this are outlined below. These will help with nasal dryness, nasal crusting, and nose bleeds. They also assist with clearing thick mucus that cause you to blow your nose frequently and may be associated with thick postnasal drip.  1. Use nasal saline daily. You can buy small bottles of this over-the-counter at the drug store or grocery store. Some brand names are: [address], [employer], [place]. Apply 2-3 sprays each nostril several times a day. If your nose feels dry or have had recent nasal surgery, try to use it every couple of hours. There is no medicine in it so it can be used as often as you like. Do NOT use the sprays containing decongestants. The appropriate way to apply nasal sprays: Place the nozzle just inside your nostril and point it towards the corner of your eye. Often, it is helpful to use the right-hand to spray into the left nasal cavity and use the left-hand to spray into the right side.  2. Use nasal saline irrigations and flushing.SinuCleanse, Simply Saline, [place]. Use this 1-2 times a day. We can also provide you with a recipe to use at home. Just ask us . To prevent reintroducing bacteria back into your nose, please keep your irrigation equipment clean and dry  between uses. Throw away and replace reusable irrigation equipment every 3 weeks.   3. Use Vaseline petroleum jelly or Aquaphor. You can apply this gently to each nostril 2-3 times a day to promote moisturization for your nose. You may also use triple antibiotic ointment such as Neosporin or Bacitracin. These can all be bought over-the-counter.  4. Consider other nasal emollients. A few preparations are available over-thecounter. Ponaris, Nose Better, Pretz. Ask your pharmacist what is available. Also, some nasal saline sprays have additives such as aloe and these are helpful.  5. Consider using a humidifier at home. If your nose feels dry and/or you have frequent nose bleeds, you can buy a humidifier for your home. Be cautious in using these if you have mold allergies.  6. Avoid excessive manual manipulation of your nose and nostrils. Frequent rubbing of your nostrils and the passing of tissues or fingers in your nostrils may aggravate nasal irritation from dryness and nose bleeds.

## 2024-07-23 ENCOUNTER — Encounter (HOSPITAL_COMMUNITY): Payer: Self-pay

## 2024-07-23 ENCOUNTER — Inpatient Hospital Stay (HOSPITAL_COMMUNITY)
Admission: AD | Admit: 2024-07-23 | Discharge: 2024-07-28 | DRG: 885 | Disposition: A | Discharge status: Home / Self Care | Source: Intra-hospital

## 2024-07-23 ENCOUNTER — Encounter (HOSPITAL_COMMUNITY): Payer: Self-pay | Admitting: Psychiatry

## 2024-07-23 ENCOUNTER — Emergency Department (HOSPITAL_COMMUNITY)
Admission: EM | Admit: 2024-07-23 | Discharge: 2024-07-23 | Disposition: A | Discharge status: Other Acute Inpatient Hospital | Source: Home / Self Care | Attending: Emergency Medicine | Admitting: Emergency Medicine

## 2024-07-23 ENCOUNTER — Other Ambulatory Visit: Payer: Self-pay

## 2024-07-23 DIAGNOSIS — F4323 Adjustment disorder with mixed anxiety and depressed mood: Secondary | ICD-10-CM | POA: Diagnosis present

## 2024-07-23 DIAGNOSIS — F840 Autistic disorder: Secondary | ICD-10-CM | POA: Insufficient documentation

## 2024-07-23 DIAGNOSIS — F913 Oppositional defiant disorder: Secondary | ICD-10-CM | POA: Diagnosis present

## 2024-07-23 DIAGNOSIS — G4700 Insomnia, unspecified: Secondary | ICD-10-CM | POA: Diagnosis present

## 2024-07-23 DIAGNOSIS — Z79899 Other long term (current) drug therapy: Secondary | ICD-10-CM | POA: Diagnosis not present

## 2024-07-23 DIAGNOSIS — F3481 Disruptive mood dysregulation disorder: Secondary | ICD-10-CM | POA: Diagnosis present

## 2024-07-23 DIAGNOSIS — F4310 Post-traumatic stress disorder, unspecified: Secondary | ICD-10-CM | POA: Diagnosis present

## 2024-07-23 DIAGNOSIS — Z6841 Body Mass Index (BMI) 40.0 and over, adult: Secondary | ICD-10-CM | POA: Diagnosis not present

## 2024-07-23 DIAGNOSIS — F603 Borderline personality disorder: Secondary | ICD-10-CM | POA: Diagnosis present

## 2024-07-23 DIAGNOSIS — R45851 Suicidal ideations: Secondary | ICD-10-CM | POA: Diagnosis present

## 2024-07-23 DIAGNOSIS — Z882 Allergy status to sulfonamides status: Secondary | ICD-10-CM | POA: Diagnosis not present

## 2024-07-23 DIAGNOSIS — Z9152 Personal history of nonsuicidal self-harm: Secondary | ICD-10-CM | POA: Diagnosis not present

## 2024-07-23 DIAGNOSIS — F322 Major depressive disorder, single episode, severe without psychotic features: Principal | ICD-10-CM | POA: Diagnosis present

## 2024-07-23 DIAGNOSIS — F332 Major depressive disorder, recurrent severe without psychotic features: Secondary | ICD-10-CM | POA: Diagnosis not present

## 2024-07-23 DIAGNOSIS — K5900 Constipation, unspecified: Secondary | ICD-10-CM | POA: Diagnosis present

## 2024-07-23 DIAGNOSIS — J45909 Unspecified asthma, uncomplicated: Secondary | ICD-10-CM | POA: Insufficient documentation

## 2024-07-23 DIAGNOSIS — X788XXA Intentional self-harm by other sharp object, initial encounter: Secondary | ICD-10-CM | POA: Insufficient documentation

## 2024-07-23 DIAGNOSIS — Z91040 Latex allergy status: Secondary | ICD-10-CM | POA: Insufficient documentation

## 2024-07-23 DIAGNOSIS — Z888 Allergy status to other drugs, medicaments and biological substances status: Secondary | ICD-10-CM | POA: Diagnosis not present

## 2024-07-23 DIAGNOSIS — F1290 Cannabis use, unspecified, uncomplicated: Secondary | ICD-10-CM | POA: Diagnosis present

## 2024-07-23 DIAGNOSIS — Z818 Family history of other mental and behavioral disorders: Secondary | ICD-10-CM

## 2024-07-23 DIAGNOSIS — S70311A Abrasion, right thigh, initial encounter: Secondary | ICD-10-CM | POA: Insufficient documentation

## 2024-07-23 DIAGNOSIS — Z91030 Bee allergy status: Secondary | ICD-10-CM

## 2024-07-23 DIAGNOSIS — F17293 Nicotine dependence, other tobacco product, with withdrawal: Secondary | ICD-10-CM | POA: Diagnosis present

## 2024-07-23 DIAGNOSIS — F209 Schizophrenia, unspecified: Secondary | ICD-10-CM | POA: Diagnosis present

## 2024-07-23 DIAGNOSIS — Z7984 Long term (current) use of oral hypoglycemic drugs: Secondary | ICD-10-CM | POA: Diagnosis not present

## 2024-07-23 DIAGNOSIS — F909 Attention-deficit hyperactivity disorder, unspecified type: Secondary | ICD-10-CM | POA: Diagnosis present

## 2024-07-23 DIAGNOSIS — F329 Major depressive disorder, single episode, unspecified: Secondary | ICD-10-CM | POA: Diagnosis not present

## 2024-07-23 DIAGNOSIS — Z91048 Other nonmedicinal substance allergy status: Secondary | ICD-10-CM

## 2024-07-23 DIAGNOSIS — Z9151 Personal history of suicidal behavior: Secondary | ICD-10-CM | POA: Diagnosis not present

## 2024-07-23 DIAGNOSIS — E119 Type 2 diabetes mellitus without complications: Secondary | ICD-10-CM | POA: Diagnosis present

## 2024-07-23 DIAGNOSIS — F39 Unspecified mood [affective] disorder: Secondary | ICD-10-CM | POA: Diagnosis not present

## 2024-07-23 LAB — RAPID URINE DRUG SCREEN, HOSP PERFORMED
Amphetamines: NOT DETECTED
Barbiturates: NOT DETECTED
Benzodiazepines: NOT DETECTED
Cocaine: NOT DETECTED
Opiates: NOT DETECTED
Tetrahydrocannabinol: POSITIVE — AB

## 2024-07-23 LAB — CBC
HCT: 39.1 % (ref 36.0–46.0)
Hemoglobin: 13.4 g/dL (ref 12.0–15.0)
MCH: 29.9 pg (ref 26.0–34.0)
MCHC: 34.3 g/dL (ref 30.0–36.0)
MCV: 87.3 fL (ref 80.0–100.0)
Platelets: 304 K/uL (ref 150–400)
RBC: 4.48 MIL/uL (ref 3.87–5.11)
RDW: 12.3 % (ref 11.5–15.5)
WBC: 9.9 K/uL (ref 4.0–10.5)
nRBC: 0 % (ref 0.0–0.2)

## 2024-07-23 LAB — COMPREHENSIVE METABOLIC PANEL WITH GFR
ALT: 19 U/L (ref 0–44)
AST: 22 U/L (ref 15–41)
Albumin: 3.8 g/dL (ref 3.5–5.0)
Alkaline Phosphatase: 45 U/L (ref 38–126)
Anion gap: 10 (ref 5–15)
BUN: 10 mg/dL (ref 6–20)
CO2: 19 mmol/L — ABNORMAL LOW (ref 22–32)
Calcium: 9.2 mg/dL (ref 8.9–10.3)
Chloride: 106 mmol/L (ref 98–111)
Creatinine, Ser: 0.82 mg/dL (ref 0.44–1.00)
GFR, Estimated: >60 mL/min (ref >60)
Glucose, Bld: 131 mg/dL — ABNORMAL HIGH (ref 70–99)
Potassium: 4 mmol/L (ref 3.5–5.1)
Sodium: 135 mmol/L (ref 135–145)
Total Bilirubin: 0.8 mg/dL (ref 0.0–1.2)
Total Protein: 7 g/dL (ref 6.5–8.1)

## 2024-07-23 LAB — HCG, SERUM, QUALITATIVE: Preg, Serum: NEGATIVE

## 2024-07-23 LAB — ETHANOL: Alcohol, Ethyl (B): <15 mg/dL (ref <15)

## 2024-07-23 MED ORDER — ACETAMINOPHEN 325 MG PO TABS
650.0000 mg | ORAL_TABLET | Freq: Four times a day (QID) | ORAL | Status: DC | PRN
  Administered 2024-07-24 – 2024-07-28 (×5): 650 mg via ORAL
  Filled 2024-07-23 (×6): qty 2

## 2024-07-23 MED ORDER — LORAZEPAM 2 MG/ML IJ SOLN: 2.0000 mg | Freq: Three times a day (TID) | INTRAMUSCULAR | Status: DC | PRN

## 2024-07-23 MED ORDER — DIPHENHYDRAMINE HCL 50 MG/ML IJ SOLN: 50.0000 mg | Freq: Three times a day (TID) | INTRAMUSCULAR | Status: DC | PRN

## 2024-07-23 MED ORDER — HALOPERIDOL LACTATE 5 MG/ML IJ SOLN: 5.0000 mg | Freq: Three times a day (TID) | INTRAMUSCULAR | Status: DC | PRN

## 2024-07-23 MED ORDER — LORAZEPAM 1 MG PO TABS: 1.0000 mg | ORAL_TABLET | Freq: Every day | ORAL | Status: DC | PRN

## 2024-07-23 MED ORDER — HYDROXYZINE HCL 25 MG PO TABS
25.0000 mg | ORAL_TABLET | Freq: Three times a day (TID) | ORAL | Status: DC | PRN
  Administered 2024-07-26 – 2024-07-27 (×2): 25 mg via ORAL
  Filled 2024-07-23 (×3): qty 1

## 2024-07-23 MED ORDER — XANOMELINE-TROSPIUM CHLORIDE 100-20 MG PO CAPS
1.0000 | ORAL_CAPSULE | Freq: Every day | ORAL | Status: DC
  Administered 2024-07-24: 1 via ORAL

## 2024-07-23 MED ORDER — LORAZEPAM 1 MG PO TABS
1.0000 mg | ORAL_TABLET | Freq: Every day | ORAL | Status: DC | PRN
  Administered 2024-07-24 – 2024-07-27 (×3): 1 mg via ORAL
  Filled 2024-07-23 (×4): qty 1

## 2024-07-23 MED ORDER — HALOPERIDOL 5 MG PO TABS: 5.0000 mg | ORAL_TABLET | Freq: Three times a day (TID) | ORAL | Status: DC | PRN

## 2024-07-23 MED ORDER — XANOMELINE-TROSPIUM CHLORIDE 100-20 MG PO CAPS: 1.0000 | ORAL_CAPSULE | Freq: Every day | ORAL | Status: DC

## 2024-07-23 MED ORDER — ACETAMINOPHEN 500 MG PO TABS
1000.0000 mg | ORAL_TABLET | Freq: Once | ORAL | Status: AC
  Administered 2024-07-23: 1000 mg via ORAL
  Filled 2024-07-23: qty 2

## 2024-07-23 MED ORDER — CALCIUM CARBONATE ANTACID 500 MG PO CHEW: 1.0000 | CHEWABLE_TABLET | Freq: Every day | ORAL | Status: DC | PRN

## 2024-07-23 MED ORDER — MELATONIN 3 MG PO TABS
3.0000 mg | ORAL_TABLET | Freq: Every evening | ORAL | Status: DC | PRN
Start: 2024-07-23 — End: 2024-07-28
  Administered 2024-07-23 – 2024-07-27 (×5): 3 mg via ORAL
  Filled 2024-07-23 (×5): qty 1

## 2024-07-23 MED ORDER — ALBUTEROL SULFATE HFA 108 (90 BASE) MCG/ACT IN AERS: 2.0000 | INHALATION_SPRAY | RESPIRATORY_TRACT | Status: DC | PRN

## 2024-07-23 MED ORDER — MAGNESIUM HYDROXIDE 400 MG/5ML PO SUSP
30.0000 mL | Freq: Every day | ORAL | Status: DC | PRN
  Filled 2024-07-23: qty 30

## 2024-07-23 MED ORDER — ONDANSETRON 4 MG PO TBDP
4.0000 mg | ORAL_TABLET | Freq: Once | ORAL | Status: AC
  Administered 2024-07-27: 4 mg via ORAL
  Filled 2024-07-23: qty 1

## 2024-07-23 MED ORDER — METFORMIN HCL 500 MG PO TABS
500.0000 mg | ORAL_TABLET | Freq: Every day | ORAL | Status: DC
  Administered 2024-07-23 – 2024-07-27 (×5): 500 mg via ORAL
  Filled 2024-07-23 (×5): qty 1

## 2024-07-23 MED ORDER — HALOPERIDOL LACTATE 5 MG/ML IJ SOLN: 10.0000 mg | Freq: Three times a day (TID) | INTRAMUSCULAR | Status: DC | PRN

## 2024-07-23 MED ORDER — DIPHENHYDRAMINE HCL 25 MG PO CAPS: 50.0000 mg | ORAL_CAPSULE | Freq: Three times a day (TID) | ORAL | Status: DC | PRN

## 2024-07-23 MED ORDER — METHOCARBAMOL 500 MG PO TABS
500.0000 mg | ORAL_TABLET | Freq: Every day | ORAL | Status: DC | PRN
  Administered 2024-07-23 – 2024-07-25 (×2): 500 mg via ORAL
  Filled 2024-07-23 (×2): qty 1

## 2024-07-23 MED ORDER — ALUM & MAG HYDROXIDE-SIMETH 200-200-20 MG/5ML PO SUSP
30.0000 mL | ORAL | Status: DC | PRN
  Administered 2024-07-24: 30 mL via ORAL
  Filled 2024-07-23: qty 30

## 2024-07-23 MED ORDER — METHOCARBAMOL 500 MG PO TABS
500.0000 mg | ORAL_TABLET | Freq: Every day | ORAL | Status: DC | PRN
Start: 2024-07-23 — End: 2024-07-23

## 2024-07-23 MED ORDER — METFORMIN HCL 500 MG PO TABS: 500.0000 mg | ORAL_TABLET | Freq: Every evening | ORAL | Status: DC

## 2024-07-23 MED ORDER — IBUPROFEN 400 MG PO TABS
600.0000 mg | ORAL_TABLET | Freq: Once | ORAL | Status: AC
  Administered 2024-07-23: 600 mg via ORAL
  Filled 2024-07-23: qty 1

## 2024-07-23 MED ORDER — ALBUTEROL SULFATE HFA 108 (90 BASE) MCG/ACT IN AERS
2.0000 | INHALATION_SPRAY | RESPIRATORY_TRACT | Status: DC | PRN
  Administered 2024-07-25: 2 via RESPIRATORY_TRACT
  Filled 2024-07-23: qty 6.7

## 2024-07-23 NOTE — Progress Notes (Signed)
 Pt has been accepted to [facility] on 07/23/2024 Bed assignment: 401-01  Pt meets inpatient criteria per: Elveria Batter NP  Attending Physician will be: Dr. Chandra MD  Report can be called to: unit: Adult unit: [phone]  Pt can arrive after 4 PM   Care Team Notified: [facility] McNichol RN, Elveria Batter NP  [place] Hamad Whyte LCSW-A   07/23/2024 1:05 PM

## 2024-07-23 NOTE — Consult Note (Addendum)
 [facility] Psychiatric Consult Initial  Patient Name: .Amanda [name]  MRN: [id number]  DOB: [birth year]  Consult Order details:  Orders (From admission, onward)     Start     Ordered   07/23/24 0715  CONSULT TO CALL ACT TEAM       Ordering Provider: Sharlet Dowdy, MD  Provider:  (Not yet assigned)  Question:  Reason for Consult?  Answer:  SI with plan to overdose   07/23/24 0715             Mode of Visit: In person    Psychiatry Consult Evaluation  Service Date: July 23, 2024 LOS:  LOS: 0 days  Chief Complaint Pt reports a lot of depression and anxiety lately after going through a break up recently. Pt reports she is suicidal and had a handful of pills she was about to take this morning in an attempt to kill herself, but never did.   Primary Psychiatric Diagnoses  MDD 2.  Suicidal ideations  3.  Adjustment disorder with mixed anxiety and depressed mood   Assessment  Amanda [name] is a 23 y.o. female admitted: Presented to the [facility] 07/23/2024  6:15 AM patient reports a lot of depression anxiety lately after going through a break-up.  Patient initially reported she was suicidal and had a handful of pills she was about to take this morning in an attempt to kill herself but did not follow through.. She carries the psychiatric diagnoses of ODD, ADD, DMDD, PTSD, bipolar disorder, and MDD and medical history that includes asthma, heart murmur.  Her current presentation of depression, sadness, decreased focus, tearfulness, irritability, decreased energy and low motivation in addition to suicidal ideations in the context of a recent break-up upon admission is most consistent with MDD and adjustment disorder with mixed anxiety and depressed mood.. She meets criteria for inpatient psychiatric admission based on current symptomology.  Current outpatient psychotropic medications include - Ativan  1 mg daily as needed, Metformin 500 mg every evening, Methocarbamol  500 mg daily as needed  muscle spasms, Tums as needed daily, Albuterol  108 mcg/ACT inhaler 2 puff every 4 hours as needed/wheezing shortness of breath, Xanomeline-Trospium Chloride 100-20 MG CAPS 1 capsule daily (Cobeny) and historically she reports a good response to these medications and reports compliance with medications  On initial examination, patient states she recently had a break-up with her boyfriend who has been her best friend over the past 4 years.  Patient admits that she had confided in another female friend and when she told her boyfriend about this he became upset and broke up with her.  Now he will not respond to her in any way.  She feels heartbroken.  She is experiencing depression with feelings of sadness, decreased focus, tearfulness, irritability, low motivation and energy.  This morning she felt hopeless after trying to contact her boyfriend with no success to obtain her personal items.  She began to feel suicidal and had a handful of her prescription medications and was considering overdosing, however she did not.  She contacted her ACT team who presented to the emergency department for evaluation.  When asked if she is currently feeling suicidal she states, just a little.  She denies homicidal ideations.  She denies AVH. She does not appear to be responding to internal/external stimuli.  Patient was initially resistant to inpatient psychiatric admission as she has been hospitalized many times in the past especially when she was a adolescent.  She admits she has worked very hard to maintain  her freedom and living independently.  Explained to patient the benefit of psychiatric admission and estimated time limit.  Patient did agreed to admission.  Please see plan below for detailed recommendations.   Diagnoses:  Active Hospital problems: Principal Problem:   MDD (major depressive disorder), severe (HCC) Active Problems:   Severe episode of recurrent major depressive disorder, without psychotic features  (HCC)    Plan   ## Psychiatric Medication Recommendations:  Resume home medications - Ativan  1 mg daily as needed - Metformin 500 mg every evening - Methocarbamol  500 mg daily as needed muscle spasms - Tums as needed daily - Albuterol  108 mcg/ACT inhaler 2 puff every 4 hours as needed/wheezing shortness of breath -Xanomeline-Trospium Chloride 100-20 MG CAPS 1 capsule daily (Cobeny)- THIS MEDICATION IS NOT FORMULARY AND IS LOCATED IN PATIENTS BELONGINGS WHICH WILL BE TRANSPORTED WITH HER TO [facility]--per [facility] pharmacy [facility] pharmacy will verify.   ## Medical Decision Making Capacity: Not specifically addressed in this encounter- per mother and patient mother is her legal guardian.   ## Further Work-up:  -- None at this time  -- most recent EKG on 07/12/2024 had QtC of 412 -- Pertinent labwork reviewed earlier this admission includes: CBC, CMP, glucose, pregnancy negative, UDS positive for [facility]   ## Disposition:-- We recommend inpatient psychiatric hospitalization when medically cleared. Patient is under voluntary admission status at this time; please IVC if attempts to leave hospital.  ## Behavioral / Environmental: -Utilize compassion and acknowledge the patient's experiences while setting clear and realistic expectations for care.    ## Safety and Observation Level:  - Based on my clinical evaluation, I estimate the patient to be at high risk of self harm in the current setting. - At this time, we recommend  1:1 Observation. This decision is based on my review of the chart including patient's history and current presentation, interview of the patient, mental status examination, and consideration of suicide risk including evaluating suicidal ideation, plan, intent, suicidal or self-harm behaviors, risk factors, and protective factors. This judgment is based on our ability to directly address suicide risk, implement suicide prevention strategies, and develop a safety plan while the patient is in  the clinical setting. Please contact our team if there is a concern that risk level has changed.  CSSR Risk Category:C-SSRS RISK CATEGORY: High Risk  Suicide Risk Assessment: Patient has following modifiable risk factors for suicide: under treated depression , active mental illness (to encompass adhd, tbi, mania, psychosis, trauma reaction), current symptoms: anxiety/panic, insomnia, impulsivity, anhedonia, hopelessness, triggering events, and recent loss (death, isolation, vocation), which we are addressing by recommending inpatient psychiatric admission. Patient has following non-modifiable or demographic risk factors for suicide: history of suicide attempt, history of self harm behavior, and psychiatric hospitalization Patient has the following protective factors against suicide: Access to outpatient mental health care, Supportive friends, and Pets in the home  Thank you for this consult request. Recommendations have been communicated to the primary team.  We will continue to follow while awaiting psychiatric bed placement at this time.   Elveria VEAR Batter, NP       History of Present Illness  Relevant Aspects of Hospital ED Course:  Admitted on 11/3/2025patient reports a lot of depression anxiety lately after going through a break-up.  Patient initially reported she was suicidal and had a handful of pills she was about to take this morning in an attempt to kill herself but did not follow through.. She carries the psychiatric diagnoses of ODD, ADD,  DMDD, PTSD, bipolar disorder, and MDD. Medical history that includes asthma, heart murmur.  Patient Report:   Pt reports a lot of depression and anxiety lately after going through a break up recently. Pt reports she is suicidal and had a handful of pills she was about to take this morning in an attempt to kill herself, but never did.   Roderick Ravens MD emergency room provider, Amanda [name] is a 23 y.o. female with history of autism, ODD, ADD,  DMDD, PTSD presenting to the emergency department for suicidal ideation.  Patient reports she and her boyfriend broke up last week which  worsened her feelings of depression and anxiety.  She reports she tried to cut herself with a razor in the leg last week but the razor was dull and she only scratched herself.  This morning she attempted to contact her ex-boyfriend to coordinate getting her belongings but he did not answer.  She reports this made her feel down, depressed, helpless and she wanted to die.  She took a handful of her prescription medication and considered overdosing however she did not.  She contacted her ACT team who presented to the emergency department for evaluation because she did not feel safe at home. SABRA  Psych ROS:  Depression: Endorses Anxiety: Endorses Mania (lifetime and current): Denies Psychosis: (lifetime and current): Denies  Collateral information:  Mining Engineer (mother) per patient mother is her legal guardian. [phone]. Mother is in agreement with psychiatric admission.  States that she believes that patient should be evaluated for Borderline Personality Disorder.  Per mother patient has reported history of high functioning autism disorder - but has never been officially diagnosed.    Review of Systems  Constitutional:  Negative for chills and fever.  Respiratory:  Negative for cough.   Cardiovascular:  Negative for chest pain.  Musculoskeletal: Negative.   Psychiatric/Behavioral:  Positive for depression. The patient is nervous/anxious.      Psychiatric and Social History  Psychiatric History:  Information collected from chart review, patient, and mother/legal guardian.   Prev Dx/Sx:  ODD, ADD, DMDD, PTSD, bipolar disorder, autism disorder and MDD Current Psych Provider: ACT team, Dr. Briant Home Meds (current): - Ativan  1 mg daily as needed - Metformin 500 mg every evening - Methocarbamol  500 mg daily as needed muscle spasms - Tums as needed daily -  Albuterol  108 mcg/ACT inhaler 2 puff every 4 hours as needed/wheezing shortness of breath -Xanomeline-Trospium Chloride 100-20 MG CAPS 1 capsule daily (Cobeny) Previous Med Trials: unknown Therapy: none in place   Prior Psych Hospitalization: multiple   Prior Self Harm: endorses last cut Yesterday - hx of suicide attempt  Prior Violence: denies  Family Psych History: mother-complex PTSD, MDD, GAD, father has on his side schizophrenia  Family Hx suicide: denies and suicide in family, but mother has had multiple attempts   Social History:  Developmental Hx: autism/add Educational Hx: 7th grade Occupational Hx: unemployed receives SSI Legal Hx: denies Living Situation: lives alone  Spiritual Hx: deferred  Access to weapons/lethal means: denies   Substance History Alcohol: denies  Tobacco: denies Illicit drugs: thc uses 1/8 of a bag weekly  Prescription drug abuse: denies    Exam Findings  Physical Exam:  Vital Signs:  Temp:  [97.9 F (36.6 C)] 97.9 F (36.6 C) (11/03 0619) Pulse Rate:  [96] 96 (11/03 0619) Resp:  [20] 20 (11/03 0619) BP: (143)/(95) 143/95 (11/03 0619) SpO2:  [97 %] 97 % (11/03 0619) Blood pressure (!) 143/95, pulse 96,  temperature 97.9 F (36.6 C), temperature source Oral, resp. rate 20, last menstrual period 06/25/2024, SpO2 97%. There is no height or weight on file to calculate BMI.  Physical Exam Cardiovascular:     Rate and Rhythm: Normal rate.  Neurological:     Mental Status: She is alert and oriented to person, place, and time.  Psychiatric:        Attention and Perception: Attention normal.        Mood and Affect: Mood is anxious and depressed.        Speech: Speech normal.        Behavior: Behavior is cooperative.        Thought Content: Thought content normal.        Cognition and Memory: Cognition normal.        Judgment: Judgment is impulsive.     Mental Status Exam: General Appearance: Casual  Orientation:  Full (Time, Place, and  Person)  Memory:  Immediate;   Fair Recent;   Fair Remote;   Fair  Concentration:  Concentration: Fair and Attention Span: Fair  Recall:  Fair  Attention  Good  Eye Contact:  Good  Speech:  Clear and Coherent  Language:  Good  Volume:  Normal  Mood: depressed  Affect:  Depressed  Thought Process:  Coherent  Thought Content:  Logical  Suicidal Thoughts:  No  Homicidal Thoughts:  No  Judgement:  Impaired  Insight:  Lacking  Psychomotor Activity:  Normal  Akathisia:  No  Fund of Knowledge:  Fair      Assets:  Communication Skills Desire for Improvement Leisure Time Physical Health Resilience  Cognition:  WNL  ADL's:  Intact  AIMS (if indicated):        Other History   These have been pulled in through the EMR, reviewed, and updated if appropriate.  Family History:  The patient's family history includes Food Allergy  in her sister.  Medical History: Past Medical History:  Diagnosis Date   Asthma    Attention deficit hyperactivity disorder    Autism    Depression    Eczema    H/O hidradenitis suppurativa    Heart murmur    Mood disorder    Oppositional defiant disorder     Surgical History: Past Surgical History:  Procedure Laterality Date   ADENOIDECTOMY     BREAST CYST EXCISION Left 05/27/2023   Procedure: Left breast cyst excision;  Surgeon: Waddell Leonce NOVAK, MD;  Location: [facility];  Service: Plastics;  Laterality: Left;   TYMPANOSTOMY TUBE PLACEMENT       Medications:   Current Facility-Administered Medications:    albuterol  (VENTOLIN  HFA) 108 (90 Base) MCG/ACT inhaler 2 puff, 2 puff, Inhalation, Q4H PRN, Sharlet Dowdy, MD   calcium carbonate (TUMS - dosed in mg elemental calcium) chewable tablet 200 mg of elemental calcium, 1 tablet, Oral, Daily PRN, Sharlet Dowdy, MD   LORazepam  (ATIVAN ) tablet 1 mg, 1 mg, Oral, Daily PRN, Sharlet Dowdy, MD   metFORMIN (GLUCOPHAGE) tablet 500 mg, 500 mg, Oral, QPM, Breen, Loren, MD   methocarbamol   (ROBAXIN ) tablet 500 mg, 500 mg, Oral, Daily PRN, Breen, Loren, MD   Xanomeline-Trospium Chloride 100-20 MG CAPS 1 capsule, 1 capsule, Oral, Daily, Sharlet Dowdy, MD  Current Outpatient Medications:    COBENFY 100-20 MG CAPS, Take 1 capsule by mouth daily., Disp: , Rfl:    cyclobenzaprine  (FLEXERIL ) 10 MG tablet, Take 1 tablet (10 mg total) by mouth 3 (three) times daily as  needed for muscle spasms. (Patient taking differently: Take 10 mg by mouth daily as needed for muscle spasms.), Disp: 10 tablet, Rfl: 0   fluconazole  (DIFLUCAN ) 150 MG tablet, Take 1 tablet (150 mg total) by mouth daily. (Patient taking differently: Take 150 mg by mouth daily as needed (yeast infection).), Disp: 1 tablet, Rfl: 0   ibuprofen  (ADVIL ) 600 MG tablet, Take 600 mg by mouth daily as needed for mild pain (pain score 1-3) or moderate pain (pain score 4-6)., Disp: , Rfl:    INOSITOL PO, Take 1 tablet by mouth in the morning and at bedtime., Disp: , Rfl:    LORazepam  (ATIVAN ) 1 MG tablet, Take 1 mg by mouth daily as needed for anxiety., Disp: , Rfl:    metFORMIN (GLUCOPHAGE) 500 MG tablet, Take 500 mg by mouth every evening., Disp: , Rfl:    methocarbamol  (ROBAXIN ) 500 MG tablet, Take 500 mg by mouth daily as needed for muscle spasms., Disp: , Rfl:    naproxen  (NAPROSYN ) 500 MG tablet, Take 500 mg by mouth daily as needed for mild pain (pain score 1-3) or moderate pain (pain score 4-6)., Disp: , Rfl:    ondansetron  (ZOFRAN -ODT) 4 MG disintegrating tablet, Take 1 tablet (4 mg total) by mouth every 8 (eight) hours as needed for nausea or vomiting. (Patient taking differently: Take 4 mg by mouth 2 (two) times daily as needed for nausea or vomiting.), Disp: 20 tablet, Rfl: 0   VENTOLIN  HFA 108 (90 Base) MCG/ACT inhaler, INHALE TWO PUFFS INTO THE LUNGS EVERY 4 HOURS AS NEEDED FOR WHEEZING AND SHORTNESS OF BREATH, Disp: 18 g, Rfl: 0   ABILIFY  MAINTENA 400 MG PRSY prefilled syringe, Inject 1 mL (200 mg total) into the muscle every 30  (thirty) days. (Due on 06-27-20): For mood control (Patient not taking: Reported on 07/23/2024), Disp: , Rfl:    clindamycin (CLEOCIN) 300 MG capsule, Take 300 mg by mouth every 6 (six) hours. (Patient not taking: Reported on 07/23/2024), Disp: , Rfl:    meclizine  (ANTIVERT ) 25 MG tablet, Take 1 tablet (25 mg total) by mouth 3 (three) times daily as needed for dizziness or nausea. (Patient not taking: Reported on 07/23/2024), Disp: 15 tablet, Rfl: 0   traZODone  (DESYREL ) 150 MG tablet, Take 150 mg by mouth at bedtime. (Patient not taking: Reported on 07/23/2024), Disp: , Rfl:   Allergies: Allergies  Allergen Reactions   Poison Ivy Extract [Poison Ivy Extract] Hives, Itching and Rash   Sulfa Antibiotics Itching, Swelling and Rash   Bee Pollen Swelling    Swelling of throat, congestion    Fluoxetine Hives   Molds & Smuts    Latex Rash   Wound Dressing Adhesive Rash    Pt reports she is allergic to Medical Adhesive    Elveria VEAR Batter, NP

## 2024-07-23 NOTE — ED Provider Notes (Signed)
  Physical Exam  BP (!) 119/93 (BP Location: Left Arm)   Pulse 90   Temp 97.7 F (36.5 C) (Oral)   Resp 18   LMP 06/25/2024 (Approximate)   SpO2 98%   Physical Exam  Procedures  Procedures  ED Course / MDM    Medical Decision Making Amount and/or Complexity of Data Reviewed Labs: ordered.  Risk OTC drugs. Prescription drug management.   accepted at [facility] after 4 PM.  Dr. Chandra accepting.       Patsey Lot, MD 07/23/24 1328

## 2024-07-23 NOTE — Plan of Care (Signed)
  Problem: Education: Goal: Emotional status will improve Outcome: Progressing Goal: Mental status will improve Outcome: Progressing   Problem: Activity: Goal: Sleeping patterns will improve Outcome: Progressing   Problem: Health Behavior/Discharge Planning: Goal: Compliance with treatment plan for underlying cause of condition will improve Outcome: Progressing   Problem: Physical Regulation: Goal: Ability to maintain clinical measurements within normal limits will improve Outcome: Progressing   Problem: Safety: Goal: Periods of time without injury will increase Outcome: Progressing

## 2024-07-23 NOTE — Progress Notes (Signed)
 Amanda CHRISTELLA Boyer, RN, 07/23/24, Time of arrival: 1712pm  Patient is a new admit to unit. Patient is voluntary. Patient belongings addressed and stored. Skin check performed with two staff, patient has small self harm abrasion on left thigh that per patient happened 8 days ago and a small rash on upper left chest. Vital signs unremarkable. No reported or observed physiological concerns or abnormalities. Patient engaged with assessment with encouragement. Patient orientated to facility, unit and room. All questions and concerns addressed at this time.  Patient stated reason for admission/reason for being here: Suicidal Ideation with plan to overdose after a break up with her boyfriend of 4 years.   Patient current presentation remarkable for: Patient appears anxious, depressed and is tearful at times throughout assessment.

## 2024-07-23 NOTE — ED Triage Notes (Signed)
 Pt reports a lot of depression and anxiety lately after going through a break up recently. Pt reports she is suicidal and had a handful of pills she was about to take this morning in an attempt to kill herself, but never did. Pt tearful in triage.

## 2024-07-23 NOTE — Progress Notes (Signed)
(  Sleep Hours) -6  (Any PRNs that were needed, meds refused, or side effects to meds)- Melatonin 3 mg  (Any disturbances and when (visitation, over night)- none  (Concerns raised by the patient)- Pt concerned about her medications, educated  pt to talk to the doctor tomorrow . Ask me 3 discussed with pt and verbal understanding stated.    (SI/HI/AVH)- passive SI

## 2024-07-23 NOTE — ED Notes (Signed)
 Please update family [name] [phone]

## 2024-07-23 NOTE — Tx Team (Signed)
 Initial Treatment Plan 07/23/2024 6:40 PM Amanda [name] [id number]    PATIENT STRESSORS: Marital or family conflict     PATIENT STRENGTHS: Ability for insight  Capable of independent living  Motivation for treatment/growth  Supportive family/friends    PATIENT IDENTIFIED PROBLEMS: Suicidal Ideation with plan to Overdose  Anxiety  Depression                 DISCHARGE CRITERIA:  Improved stabilization in mood, thinking, and/or behavior Need for constant or close observation no longer present Reduction of life-threatening or endangering symptoms to within safe limits  PRELIMINARY DISCHARGE PLAN: Attend aftercare/continuing care group Return to previous living arrangement  PATIENT/FAMILY INVOLVEMENT: This treatment plan has been presented to and reviewed with the patient, Amanda [name].  The patient and family have been given the opportunity to ask questions and make suggestions.  Ronnald CHRISTELLA Boyer, RN 07/23/2024, 6:40 PM

## 2024-07-23 NOTE — ED Provider Notes (Addendum)
 [place] EMERGENCY DEPARTMENT AT [facility] Provider Note   CSN: [id number] Arrival date & time: 07/23/24  9386     Patient presents with: Suicidal   Amanda [name] is a 23 y.o. female with history of autism, ODD, ADD, DMDD, PTSD presenting to the emergency department for suicidal ideation.  Patient reports she and her boyfriend broke up last week which  worsened her feelings of depression and anxiety.  She reports she tried to cut herself with a razor in the leg last week but the razor was dull and she only scratched herself.  This morning she attempted to contact her ex-boyfriend to coordinate getting her belongings but he did not answer.  She reports this made her feel down, depressed, helpless and she wanted to die.  She took a handful of her prescription medication and considered overdosing however she did not.  She contacted her ACT team who presented to the emergency department for evaluation because she did not feel safe at home.   HPI     Prior to Admission medications   Medication Sig Start Date End Date Taking? Authorizing Provider  COBENFY 100-20 MG CAPS Take 1 capsule by mouth daily. 07/20/24  Yes [provider]  cyclobenzaprine  (FLEXERIL ) 10 MG tablet Take 1 tablet (10 mg total) by mouth 3 (three) times daily as needed for muscle spasms. Patient taking differently: Take 10 mg by mouth daily as needed for muscle spasms. 01/13/24  Yes Vicky Charleston, PA-C  fluconazole  (DIFLUCAN ) 150 MG tablet Take 1 tablet (150 mg total) by mouth daily. Patient taking differently: Take 150 mg by mouth daily as needed (yeast infection). 09/25/23  Yes Rancour, Garnette, MD  ibuprofen  (ADVIL ) 600 MG tablet Take 600 mg by mouth daily as needed for mild pain (pain score 1-3) or moderate pain (pain score 4-6).   Yes [provider]  INOSITOL PO Take 1 tablet by mouth in the morning and at bedtime.   Yes [provider]  LORazepam  (ATIVAN ) 1 MG tablet Take 1 mg  by mouth daily as needed for anxiety.   Yes [provider]  metFORMIN (GLUCOPHAGE) 500 MG tablet Take 500 mg by mouth every evening.   Yes [provider]  methocarbamol  (ROBAXIN ) 500 MG tablet Take 500 mg by mouth daily as needed for muscle spasms.   Yes [provider]  naproxen  (NAPROSYN ) 500 MG tablet Take 500 mg by mouth daily as needed for mild pain (pain score 1-3) or moderate pain (pain score 4-6).   Yes [provider]  ondansetron  (ZOFRAN -ODT) 4 MG disintegrating tablet Take 1 tablet (4 mg total) by mouth every 8 (eight) hours as needed for nausea or vomiting. Patient taking differently: Take 4 mg by mouth 2 (two) times daily as needed for nausea or vomiting. 05/17/23  Yes Landy Honora CROME, PA-C  VENTOLIN  HFA 108 (90 Base) MCG/ACT inhaler INHALE TWO PUFFS INTO THE LUNGS EVERY 4 HOURS AS NEEDED FOR WHEEZING AND SHORTNESS OF BREATH 12/06/22  Yes Padgett, Danita Macintosh, MD  ABILIFY  MAINTENA 400 MG PRSY prefilled syringe Inject 1 mL (200 mg total) into the muscle every 30 (thirty) days. (Due on 06-27-20): For mood control Patient not taking: Reported on 07/23/2024 06/30/23   [provider]  clindamycin (CLEOCIN) 300 MG capsule Take 300 mg by mouth every 6 (six) hours. Patient not taking: Reported on 07/23/2024 07/03/24   [provider]  meclizine  (ANTIVERT ) 25 MG tablet Take 1 tablet (25 mg total) by mouth 3 (  three) times daily as needed for dizziness or nausea. Patient not taking: Reported on 07/23/2024 05/30/24   Nivia Colon, PA-C  traZODone  (DESYREL ) 150 MG tablet Take 150 mg by mouth at bedtime. Patient not taking: Reported on 07/23/2024 06/27/24   [provider]  fluticasone  (FLONASE ) 50 MCG/ACT nasal spray Place 2 sprays into both nostrils daily. Patient not taking: Reported on 08/01/2019 12/15/18 08/01/19  Carlyle Lenis, MD  loratadine  (CLARITIN ) 10 MG tablet Take 1 tablet (10 mg total) by mouth daily. Patient not taking:  Reported on 05/28/2019 12/15/18 08/01/19  Carlyle Lenis, MD    Allergies: Poison ivy extract [poison ivy extract], Sulfa antibiotics, Bee pollen, Fluoxetine, Molds & smuts, Latex, and Wound dressing adhesive    Review of Systems  Psychiatric/Behavioral:  Positive for suicidal ideas. Negative for hallucinations. The patient is nervous/anxious.     Updated Vital Signs BP (!) 143/95 (BP Location: Right Arm)   Pulse 96   Temp 97.9 F (36.6 C) (Oral)   Resp 20   LMP 06/25/2024 (Approximate)   SpO2 97%   Physical Exam Vitals and nursing note reviewed.  Constitutional:      General: She is not in acute distress.    Appearance: She is well-developed.  HENT:     Head: Normocephalic and atraumatic.  Eyes:     Conjunctiva/sclera: Conjunctivae normal.  Cardiovascular:     Rate and Rhythm: Normal rate and regular rhythm.  Pulmonary:     Effort: Pulmonary effort is normal. No respiratory distress.  Musculoskeletal:     Cervical back: Neck supple.  Skin:    General: Skin is warm and dry.     Comments: Abrasion to the right lateral thigh  Neurological:     Mental Status: She is alert.     (all labs ordered are listed, but only abnormal results are displayed) Labs Reviewed  COMPREHENSIVE METABOLIC PANEL WITH GFR - Abnormal; Notable for the following components:      Result Value   CO2 19 (*)    Glucose, Bld 131 (*)    All other components within normal limits  RAPID URINE DRUG SCREEN, HOSP PERFORMED - Abnormal; Notable for the following components:   Tetrahydrocannabinol POSITIVE (*)    All other components within normal limits  ETHANOL  CBC  HCG, SERUM, QUALITATIVE    EKG: None  Radiology: No results found.   Procedures   Medications Ordered in the ED - No data to display                                  Medical Decision Making Patient is otherwise healthy 23 year old female with significant psychiatric history presenting for suicidal ideation.  She denies  homicidal ideation or audiovisual hallucinations.  Patient is awake, alert, oriented and GCS 15.  She had the foresight to call her outpatient ACT team and present herself here to the emergency department for her own safety.  Patient's presentation is most consistent with decompensated depression.  Patient without paranoia, hallucinations, or other associated psychiatric symptoms.  Patient denies drugs or other toxic ingestions.  No EtOH use.  Patient without history of head trauma or neurodeficits on exam.  Psychiatric screening labs without leukocytosis, anemia, significant electrolyte abnormality.  Pregnancy test negative.  Ethanol level undetectable.  UDS positive for THC.  Suicide precautions in place and TTS was consulted for behavioral health evaluation.  Patient does not require IVC at this time given  she is here voluntarily and motivated to seek help.  Patient was medically cleared at this time and pending TTS evaluation.  Patient's home medications were ordered and patient provided Tylenol  for headache.  Please see TES note for remainder patient's care.  Amount and/or Complexity of Data Reviewed Labs: ordered.  Risk OTC drugs. Prescription drug management.        Final diagnoses:  Suicidal ideation  Severe episode of recurrent major depressive disorder, without psychotic features [facility])    ED Discharge Orders     None          Sharlet Dowdy, MD 07/23/24 9083    Sharlet Dowdy, MD 07/23/24 9080    Lenor Hollering, MD 07/23/24 [phone]

## 2024-07-23 NOTE — [facility] Group Notes (Signed)
 [facility] Group Notes:  (Nursing/MHT/Case Management/Adjunct)  Date:  07/23/2024  Time: 2000  Type of Therapy:  Wrap up group  Participation Level:  Active  Participation Quality:  Appropriate, Attentive, Sharing, and Supportive  Affect:  Anxious  Cognitive:  Alert  Insight:  Improving  Engagement in Group:  Engaged  Modes of Intervention:  Clarification, Education, and Support  Summary of Progress/Problems: Positive thinking and positive change were discussed.   Lenora Manuelita RAMAN 07/23/2024, 8:35 PM

## 2024-07-23 NOTE — ED Notes (Signed)
 Pt sent all valuables home with her friend Charmaine. Clothing placed in bag and is now in locker 15.

## 2024-07-24 MED ORDER — POLYETHYLENE GLYCOL 3350 17 G PO PACK
17.0000 g | PACK | Freq: Every day | ORAL | Status: DC
  Administered 2024-07-24 – 2024-07-28 (×4): 17 g via ORAL
  Filled 2024-07-24 (×4): qty 1

## 2024-07-24 MED ORDER — XANOMELINE-TROSPIUM CHLORIDE 100-20 MG PO CAPS
1.0000 | ORAL_CAPSULE | Freq: Two times a day (BID) | ORAL | Status: DC
  Administered 2024-07-24 – 2024-07-28 (×8): 1 via ORAL

## 2024-07-24 NOTE — Group Note (Signed)
 Date:  07/24/2024 Time:  5:48 PM  Group Topic/Focus: Compliance with care/Identifying resources  Wellness Toolbox:   The focus of this group is to discuss various aspects of wellness, balancing those aspects and exploring ways to increase the ability to experience wellness.  Patients will create a wellness toolbox for use upon discharge.    Participation Level:  Active  Participation Quality:  Appropriate  Affect:  Appropriate  Cognitive:  Appropriate  Insight: Appropriate  Engagement in Group:  Engaged  Modes of Intervention:  Discussion  Additional Comments:  Pt engaged appropriately during group.  Arthuro Canelo D Eldine Rencher 07/24/2024, 5:48 PM
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
## 2024-07-24 NOTE — Group Note (Signed)
 Recreation Therapy Group Note   Group Topic:Animal Assisted Therapy   Group Date: 07/24/2024 Start Time: 9051 End Time: 1030 Facilitators: Anah Billard-McCall, LRT,CTRS Location: [address] Dayroom   Animal-Assisted Activity (AAA) Program Checklist/Progress Notes Patient Eligibility Criteria Checklist & Daily Group note for Rec Tx Intervention  AAA/T Program Assumption of Risk Form signed by Patient/ or Parent Legal Guardian Yes  Patient is free of allergies or severe asthma Yes  Patient reports no fear of animals Yes  Patient reports no history of cruelty to animals Yes  Patient understands his/her participation is voluntary Yes  Patient washes hands before animal contact Yes  Patient washes hands after animal contact Yes  Behavioral Response:    Education: Hand Washing, Appropriate Animal Interaction   Education Outcome: Acknowledges education.    Affect/Mood: N/A   Participation Level: Did not attend    Clinical Observations/Individualized Feedback:      Plan: Continue to engage patient in RT group sessions 2-3x/week.   Amanda [name], LRT,CTRS 07/24/2024 12:26 PM

## 2024-07-24 NOTE — Group Note (Signed)
 LCSW Group Therapy Note   Group Date: 07/24/2024 Start Time: 1100 End Time: 1200   Participation:  patient was present.  She listened and was respectful but didn't participate in the discussion.  Type of Therapy:  Group Therapy   Topic:  Speaking from the Heart: Communicating with Understanding and Empathy  Objective:  To help participants develop effective communication skills to express themselves clearly, listen actively, and navigate conflicts in a healthy way.  Goals: - Increase awareness of verbal and non-verbal communication skills. - Practice using "I" statements and active listening techniques. - Learn coping strategies for managing communication stress.  Summary:  Participants explored the importance of communication, discussed challenges, and practiced skills such as active listening and assertive expression. They reflected on past experiences and identified ways to improve communication in their daily lives.  Therapeutic Modalities: Cognitive-Behavioral Therapy (CBT): Restructuring negative thought patterns in communication. Mindfulness: Staying present and calm during conversations. Psychoeducation: Learning about effective communication techniques.   Leor Whyte O Lily Velasquez, LCSWA 07/24/2024  12:44 PM
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
## 2024-07-24 NOTE — Group Note (Signed)
 Date:  07/24/2024 Time:  10:26 AM  Group Topic/Focus:  Goals Group:   Today's group explored Maslow's Hierarchy of Needs as a framework for understanding personal growth and recovery. Patients discussed each level of the hierarchy--physiological, safety, love/belonging, esteem, and self-actualization--and identified coping skills that support progress within each area. The group then applied this understanding to goal setting, identifying how meeting foundational needs and using healthy coping strategies can promote movement toward higher levels of recovery. Patients set individualized goals for the next week, month, and year, connecting each goal to specific coping skills and personal needs. Discussion emphasized the importance of balance, self-awareness, and consistent effort in maintaining recovery.    Participation Level:  Active  Participation Quality:  Appropriate, Attentive, and Sharing  Affect:  Appropriate  Cognitive:  Alert and Appropriate  Insight: Appropriate and Improving  Engagement in Group:  Engaged and Improving  Modes of Intervention:  Discussion, Education, Exploration, and Socialization  Additional Comments:  Jumana attended and actively participated in goals group.  Kristi HERO Elen Acero 07/24/2024, 10:26 AM

## 2024-07-24 NOTE — Plan of Care (Signed)
   Problem: Education: Goal: Emotional status will improve Outcome: Progressing Goal: Mental status will improve Outcome: Progressing   Problem: Activity: Goal: Interest or engagement in activities will improve Outcome: Progressing Goal: Sleeping patterns will improve Outcome: Progressing

## 2024-07-24 NOTE — [facility] Group Notes (Signed)
 Amanda [name] attended the social work group today, 07/24/24 from 1100-1200.

## 2024-07-24 NOTE — [facility] Group Notes (Signed)
 Dagny attended the [facility] group today, 07/24/24 from [id number].

## 2024-07-24 NOTE — [facility] Group Notes (Signed)
 Amanda [name] did not attend the recreational therapy/pet therapy group today, 07/24/24 from [id number]. She stated to me she is allergic to dogs but wish she could go.

## 2024-07-24 NOTE — Progress Notes (Signed)
(  Sleep Hours) -8.5  (Any PRNs that were needed, meds refused, or side effects to meds)- Melatonin 3 mg, Tylenol  650 mg  (Any disturbances and when (visitation, over night)- none   (Concerns raised by the patient)- none, Ask me 3 discussed with pt and verbal understanding stated.    (SI/HI/AVH)- denies

## 2024-07-24 NOTE — Plan of Care (Signed)
   Problem: Activity: Goal: Interest or engagement in activities will improve Outcome: Progressing   Problem: Coping: Goal: Ability to verbalize frustrations and anger appropriately will improve Outcome: Progressing   Problem: Safety: Goal: Periods of time without injury will increase Outcome: Progressing

## 2024-07-24 NOTE — Progress Notes (Signed)
   07/24/24 1454  Psych Admission Type (Psych Patients Only)  Admission Status Voluntary/72 hour document signed  Date 72 hour document signed  07/24/24  Time 72 hour document signed  1454  Provider Notified (First and Last Name) (see details for LINK to note) Dr. Chandra

## 2024-07-24 NOTE — Progress Notes (Signed)
   07/24/24 0800  Psych Admission Type (Psych Patients Only)  Admission Status Voluntary  Psychosocial Assessment  Patient Complaints Anxiety  Eye Contact Fair  Facial Expression Anxious  Affect Anxious  Speech Logical/coherent  Interaction Assertive  Motor Activity Slow;Restless  Appearance/Hygiene In hospital gown  Behavior Characteristics Cooperative;Appropriate to situation  Mood Depressed;Anxious  Aggressive Behavior  Effect No apparent injury  Thought Process  Coherency WDL  Content WDL  Delusions WDL;None reported or observed  Perception WDL  Hallucination None reported or observed  Judgment Impaired  Confusion WDL  Danger to Self  Current suicidal ideation? Passive  Self-Injurious Behavior Some self-injurious ideation observed or expressed.  No lethal plan expressed   Agreement Not to Harm Self Yes  Description of Agreement verbal  Danger to Others  Danger to Others None reported or observed

## 2024-07-24 NOTE — H&P (Addendum)
 Psychiatric Admission Assessment Adult  Patient Identification:  Amanda [name] MRN: [id number] Date of Evaluation: 07/24/2024 Chief Complaint:  MDD (major depressive disorder), severe (HCC) [F32.2] Principal Diagnosis:  MDD (major depressive disorder), severe (HCC) Diagnosis:  Principal Problem:   MDD (major depressive disorder), severe (HCC)  SUBJECTIVE:  CC: Suicidal ideation  HPI: Amanda [name] is a 23 y.o. female with a past psychiatric history of ODD, ADHD, DMDD, PTSD, bipolar, MDD. Patient initially arrived to [facility] on 07/23/2024 for suicidal ideation and was admitted to [facility] voluntarily on 07/24/24 for acute safety concerns and crisis stabalization. PMHx is significant for hidradenitis suppurativa and asthma.  On initial encounter, patient reports she came to the emergency department after having worsening suicidal ideation with a plan to overdose on pills, depression, and anxiety after her boyfriend broke up with her last week. She describes her boyfriend as her best friend over the last 4 years. She describes their relationship as good until this past September when the relationship became toxic. Patient states she was taken by surprise because they had been making plans for the future. She gets frustrated and sad because her ex-boyfriend will tell her contradictory statements regarding whether or not he wants to be with her. She does not endorse an unstable pattern of romantic relationships, however she describes getting very attached in relationships to a point of almost obsession. She describes herself as very lonely. In the past, she has had periods of impulsivity and mood lability. Reports she will go from feeling happiness to anger in the span of a few hours or days. Reports her sleep is poor, often waking up throughout the night and having nightmares. Denies auditory or visual hallucinations.  Patient reports she has a complex psychiatric history dating back to adolescence.  From ages [age], she was sent to live in a multiple group homes and therapeutic foster homes due to severe anger issues.  Denies history of trauma or abuse. She has trialed several psychotropic medications, most recently she was on Abilify  Maintena since 2021, but discontinued the medication due to weight gain. She did have improvement in mood while taking Abilify  Maintena. She follows with an ACT team, who switched her to [facility], which she feels has helped her concentration.   She endorses history of marijuana use, stating she uses it to relax when she is feeling stressed. Reports weekly use. Reports history of cutting, most recently just before presenting to the emergency department. Reports cutting helps distract her from negative thoughts. When she has money, she prefers to get tattoos or piercings as a substitute for cutting behavior. Reports positive social support from her close friend Charmaine and has several cats at home. Patient would like to look further into PHP/IOP programming.  Collateral from [facility] (mother, legal guardian [phone]):  Spoke with patient's mother and legal guardian. Mother reports patient's behavioral challenges began in elementary school, when she started showing significant anger and emotional dysregulation. Behaviors included eloping, crying, screaming, destroying property, running away, and self-harm. Patient moved between foster homes and was often hospitalized after running away. Reports aggression toward siblings, resulting in DSS involvement. In 2019, patient started following with ACT team for additional support. Mom reports patient has very intense and unstable relationships and insecure attachment. Has never been in CBT or DBT. In patient's most recent relationship, patient was calling boyfriend several times during the day and needed to be talking to him at all times. Describes patient as acting like a teenager when it comes to relationships and  life. She  also has difficulty managing money and daily responsibilities. Mom states she is always waiting for the spiral, in reference to patient's emotions after a failed relationship. Anxiety continues to be a major issues for patient, and she continues to demonstrate limited coping skills when managing life stressors.  Past Psychiatric History: Current psychiatrist: ACT team, Dr. Briant  Current therapist: None Previous psychiatric diagnoses: ODD, ADHD, DMDD, PTSD, bipolar, MDD, ASD (reportedly high functioning, but unofficially diagnosed) Psychiatric Medications:  -Home Meds: Cobenfy, Ativan  1 mg daily as needed   -Past Med Trials: Abilify  Maintena, Seroquel, lithium , Focalin , Celexa , trazodone , Clozapine , Vyvanse  Psychiatric hospitalization(s): Multiple hospitalizations especially when she was an adolescent Psychotherapy history: was attending therapy 4 years ago, but has stopped  Neuromodulation history: none History of suicide (obtained from HPI): per chart review, patient reported attempting multiple times in the past by hanging herself and cutting History of homicide or aggression (obtained in HPI): denies NSSIB: yes, cutting.  Endorses last cutting yesterday  Substance Abuse History: Alcohol: denies  Tobacco: denies Illicit drugs: thc uses 1/8 of a bag weekly  Prescription drug abuse: denies  Past Medical History: Medical diagnoses: hidradenitis suppurativa and asthma. Allergies: Prozac (hives), sulfa antibiotics PCP: Rosina Amy, PA-C Surgeries: adenoidectomy  Social History: Developmental Hx: ADHD. Reports between the ages of 66 and 50 growing up and several group homes in therapeutic foster homes. Reports having severe anger issues as a child. Educational Hx: 7th grade Occupational Hx: unemployed receives SSI Legal Hx: denies Living Situation: lives alone  Spiritual Hx: deferred  Access to weapons/lethal means: denies    Family Psychiatric History: Mother- complex PTSD,  MDD, GAD, father has on his side schizophrenia. Denies and suicide in family, but mother has had multiple attempts.  Family Medical History: None  Columbia Scale:  Flowsheet Row [facility] (Current[facility] Most recent reading at 07/23/2024  6:00 PM ED from 07/23/2024 in [facility] Emergency Department at [facility] Most recent reading at 07/23/2024  6:23 AM ED from 07/11/2024 in [facility] Emergency Department at [facility] Most recent reading at 07/11/2024  1:24 PM  C-SSRS RISK CATEGORY High Risk High Risk No Risk     Tobacco Screening:  Social History   Tobacco Use  Smoking Status Never   Passive exposure: Current  Smokeless Tobacco Never    BH Tobacco Counseling     Are you interested in Tobacco Cessation Medications?  No, patient refused Counseled patient on smoking cessation:  Refused/Declined practical counseling Reason Tobacco Screening Not Completed: Patient Refused Screening    Allergies:   Allergies  Allergen Reactions   Poison Ivy Extract [Poison Ivy Extract] Hives, Itching and Rash   Sulfa Antibiotics Itching, Swelling and Rash   Bee Pollen Swelling    Swelling of throat, congestion    Fluoxetine Hives   Kiwi Extract Other (See Comments)    Tingling of tongue   Molds & Smuts    Latex Rash   Wound Dressing Adhesive Rash    Pt reports she is allergic to Medical Adhesive    OBJECTIVE:  Physical Examination:  Physical Exam Vitals and nursing note reviewed.  Constitutional:      General: She is not in acute distress.    Appearance: Normal appearance. She is normal weight. She is not ill-appearing.  HENT:     Head: Normocephalic and atraumatic.  Pulmonary:     Effort: Pulmonary effort is normal. No respiratory distress.  Neurological:     General: No focal deficit present.     Mental Status: She is alert and oriented to person, place, and time. Mental status is at baseline.     Review of Systems  Gastrointestinal:  Negative for abdominal pain, constipation, diarrhea, nausea and vomiting.  Neurological:  Negative for dizziness and headaches.  All other systems reviewed and are negative.  Blood pressure (!) 137/101, pulse 78, temperature 97.7 F (36.5 C), temperature source Oral, resp. rate 18, height 5' 4 (1.626 m), weight 114 kg, last menstrual period 06/25/2024, SpO2 98%. Body mass index is 43.15 kg/m.  Metabolic disorder labs:  Lab Results  Component Value Date   HGBA1C 5.0 05/05/2020   MPG 96.8 05/05/2020   MPG 103 06/18/2009   Lab Results  Component Value Date   PROLACTIN 33.1 (H) 06/02/2020   Lab Results  Component Value Date   CHOL 148 06/02/2020   TRIG 117 06/02/2020   HDL 38 (L) 06/02/2020   CHOLHDL 3.9 06/02/2020   VLDL 23 06/02/2020   LDLCALC 87 06/02/2020   LDLCALC 125 (H) 05/05/2020    Results for orders placed or performed during the hospital encounter of 07/23/24 (from the past 48 hours)  Comprehensive metabolic panel     Status: Abnormal   Collection Time: 07/23/24  6:29 AM  Result Value Ref Range   Sodium 135 135 - 145 mmol/L   Potassium 4.0 3.5 - 5.1 mmol/L   Chloride 106 98 - 111 mmol/L   CO2 19 (L) 22 - 32 mmol/L   Glucose, Bld 131 (H) 70 - 99 mg/dL    Comment: Glucose reference range applies only to samples taken after fasting for at least 8 hours.   BUN 10 6 - 20 mg/dL   Creatinine, Ser 9.17 0.44 - 1.00 mg/dL   Calcium 9.2 8.9 - 89.6 mg/dL   Total Protein 7.0 6.5 - 8.1 g/dL   Albumin 3.8 3.5 - 5.0 g/dL   AST 22 15 - 41 U/L   ALT 19 0 - 44 U/L   Alkaline Phosphatase 45 38 - 126 U/L   Total Bilirubin 0.8 0.0 - 1.2 mg/dL   GFR, Estimated >39 >39 mL/min    Comment: (NOTE) Calculated using the CKD-EPI Creatinine Equation (2021)    Anion gap 10 5 - 15    Comment: Performed at [facility], [address] [place], [place] [address]  Ethanol     Status: None   Collection Time: 07/23/24  6:29 AM  Result  Value Ref Range   Alcohol, Ethyl (B) <15 <15 mg/dL    Comment: (NOTE) For medical purposes only. Performed at [facility], [address], [place], [place] [address]   cbc     Status: None   Collection Time: 07/23/24  6:29 AM  Result Value Ref Range   WBC 9.9 4.0 - 10.5 K/uL   RBC 4.48 3.87 - 5.11 MIL/uL   Hemoglobin 13.4 12.0 - 15.0 g/dL   HCT 60.8 63.9 - 53.9 %   MCV 87.3 80.0 - 100.0 fL   MCH 29.9 26.0 - 34.0 pg   MCHC 34.3 30.0 - 36.0 g/dL   RDW 87.6 88.4 - 84.4 %   Platelets 304 150 - 400 K/uL   nRBC 0.0 0.0 - 0.2 %    Comment: Performed at [facility], [address] [place], [place] [address]  hCG, serum, qualitative     Status: None   Collection Time:  07/23/24  6:29 AM  Result Value Ref Range   Preg, Serum NEGATIVE NEGATIVE    Comment:        THE SENSITIVITY OF THIS METHODOLOGY IS >10 mIU/mL. Performed at [facility], [address], [place], [place] [address]   Rapid urine drug screen (hospital performed)     Status: Abnormal   Collection Time: 07/23/24  6:51 AM  Result Value Ref Range   Opiates NONE DETECTED NONE DETECTED   Cocaine NONE DETECTED NONE DETECTED   Benzodiazepines NONE DETECTED NONE DETECTED   Amphetamines NONE DETECTED NONE DETECTED   Tetrahydrocannabinol POSITIVE (A) NONE DETECTED   Barbiturates NONE DETECTED NONE DETECTED    Comment: (NOTE) DRUG SCREEN FOR MEDICAL PURPOSES ONLY.  IF CONFIRMATION IS NEEDED FOR ANY PURPOSE, NOTIFY LAB WITHIN 5 DAYS.  LOWEST DETECTABLE LIMITS FOR URINE DRUG SCREEN Drug Class                     Cutoff (ng/mL) Amphetamine and metabolites    1000 Barbiturate and metabolites    200 Benzodiazepine                 200 Opiates and metabolites        300 Cocaine and metabolites        300 THC                            50 Performed at [facility], [address], [place], [place] [address]     Blood alcohol level:  Lab Results  Component Value Date   [facility] <15 07/23/2024   ETH  <10 06/02/2020    Current Medications: Current Facility-Administered Medications  Medication Dose Route Frequency Provider Last Rate Last Admin   acetaminophen  (TYLENOL ) tablet 650 mg  650 mg Oral Q6H PRN Coleman, Carolyn H, NP       albuterol  (VENTOLIN  HFA) 108 (90 Base) MCG/ACT inhaler 2 puff  2 puff Inhalation Q4H PRN Coleman, Carolyn H, NP       alum & mag hydroxide-simeth (MAALOX/MYLANTA) 200-200-20 MG/5ML suspension 30 mL  30 mL Oral Q4H PRN Coleman, Carolyn H, NP   30 mL at 07/24/24 0546   calcium carbonate (TUMS - dosed in mg elemental calcium) chewable tablet 200 mg of elemental calcium  1 tablet Oral Daily PRN Mardy Elveria DEL, NP       haloperidol (HALDOL) tablet 5 mg  5 mg Oral TID PRN Mardy Elveria DEL, NP       And   diphenhydrAMINE  (BENADRYL ) capsule 50 mg  50 mg Oral TID PRN Mardy Elveria DEL, NP       haloperidol lactate (HALDOL) injection 5 mg  5 mg Intramuscular TID PRN Mardy Elveria DEL, NP       And   diphenhydrAMINE  (BENADRYL ) injection 50 mg  50 mg Intramuscular TID PRN Mardy Elveria DEL, NP       And   LORazepam  (ATIVAN ) injection 2 mg  2 mg Intramuscular TID PRN Coleman, Carolyn H, NP       haloperidol lactate (HALDOL) injection 10 mg  10 mg Intramuscular TID PRN Mardy Elveria DEL, NP       And   diphenhydrAMINE  (BENADRYL ) injection 50 mg  50 mg Intramuscular TID PRN Mardy Elveria DEL, NP       And   LORazepam  (ATIVAN ) injection 2 mg  2 mg Intramuscular TID PRN Mardy Elveria DEL, NP  hydrOXYzine  (ATARAX ) tablet 25 mg  25 mg Oral TID PRN Coleman, Carolyn H, NP       LORazepam  (ATIVAN ) tablet 1 mg  1 mg Oral Daily PRN Coleman, Carolyn H, NP       magnesium  hydroxide (MILK OF MAGNESIA) suspension 30 mL  30 mL Oral Daily PRN Coleman, Carolyn H, NP       melatonin tablet 3 mg  3 mg Oral QHS PRN Onuoha, Chinwendu V, NP   3 mg at 07/23/24 2119   metFORMIN (GLUCOPHAGE) tablet 500 mg  500 mg Oral Q supper Coleman, Carolyn H, NP   500 mg at 07/23/24 2119    methocarbamol  (ROBAXIN ) tablet 500 mg  500 mg Oral Daily PRN Coleman, Carolyn H, NP   500 mg at 07/23/24 1836   ondansetron  (ZOFRAN -ODT) disintegrating tablet 4 mg  4 mg Oral Once McLauchlin, Angela, NP       polyethylene glycol (MIRALAX  / GLYCOLAX ) packet 17 g  17 g Oral Daily Mannie Amanda SAILOR, MD       Xanomeline-Trospium Chloride 100-20 MG CAPS 1 capsule  1 capsule Oral Daily Coleman, Carolyn H, NP   1 capsule at 07/24/24 1056    PTA Medications: Medications Prior to Admission  Medication Sig Dispense Refill Last Dose/Taking   ABILIFY  MAINTENA 400 MG PRSY prefilled syringe Inject 1 mL (200 mg total) into the muscle every 30 (thirty) days. (Due on 06-27-20): For mood control (Patient not taking: Reported on 07/23/2024)      clindamycin (CLEOCIN) 300 MG capsule Take 300 mg by mouth every 6 (six) hours. (Patient not taking: Reported on 07/23/2024)      COBENFY 100-20 MG CAPS Take 1 capsule by mouth daily.      cyclobenzaprine  (FLEXERIL ) 10 MG tablet Take 1 tablet (10 mg total) by mouth 3 (three) times daily as needed for muscle spasms. (Patient taking differently: Take 10 mg by mouth daily as needed for muscle spasms.) 10 tablet 0    fluconazole  (DIFLUCAN ) 150 MG tablet Take 1 tablet (150 mg total) by mouth daily. (Patient taking differently: Take 150 mg by mouth daily as needed (yeast infection).) 1 tablet 0    ibuprofen  (ADVIL ) 600 MG tablet Take 600 mg by mouth daily as needed for mild pain (pain score 1-3) or moderate pain (pain score 4-6).      INOSITOL PO Take 1 tablet by mouth in the morning and at bedtime.      LORazepam  (ATIVAN ) 1 MG tablet Take 1 mg by mouth daily as needed for anxiety.      meclizine  (ANTIVERT ) 25 MG tablet Take 1 tablet (25 mg total) by mouth 3 (three) times daily as needed for dizziness or nausea. (Patient not taking: Reported on 07/23/2024) 15 tablet 0    metFORMIN (GLUCOPHAGE) 500 MG tablet Take 500 mg by mouth every evening.      methocarbamol  (ROBAXIN ) 500 MG tablet  Take 500 mg by mouth daily as needed for muscle spasms.      naproxen  (NAPROSYN ) 500 MG tablet Take 500 mg by mouth daily as needed for mild pain (pain score 1-3) or moderate pain (pain score 4-6).      ondansetron  (ZOFRAN -ODT) 4 MG disintegrating tablet Take 1 tablet (4 mg total) by mouth every 8 (eight) hours as needed for nausea or vomiting. (Patient taking differently: Take 4 mg by mouth 2 (two) times daily as needed for nausea or vomiting.) 20 tablet 0    traZODone  (DESYREL ) 150 MG tablet Take  150 mg by mouth at bedtime. (Patient not taking: Reported on 07/23/2024)      VENTOLIN  HFA 108 (90 Base) MCG/ACT inhaler INHALE TWO PUFFS INTO THE LUNGS EVERY 4 HOURS AS NEEDED FOR WHEEZING AND SHORTNESS OF BREATH 18 g 0     Mental Status Exam:  Appearance: Casually dressed, young Caucasian female  Behavior: Cooperative, tearful at times  Attitude: Pleasant  Speech: Normal rate rhythm and tone  Mood: Dysthymic  Affect: Congruent, constricted  Thought Process: Linear, logical, goal directed  Thought Content: No delusional content endorsed  SI/HI: Denies  Perceptions: Denies AVH  Judgement: Poor  Insight: Fair  Fund of Knowledge: WNL    ASSESSMENT: Amanda [name] is a 23 y.o. female with a past psychiatric history of ODD, ADHD, DMDD, PTSD, bipolar, MDD presenting with worsening depression and suicidal ideation following a recent break-up with her boyfriend. Her presentation appears most consistent with unspecified mood disorder. Patient exhibits several features concerning for borderline personality disorder, including mood lability, intense emotions, identity disturbance, and fear of abandonment. Although she carries a historical diagnosis of bipolar 1 disorder, she does not appear currently endorse any clear history of discrete manic episodes or prolonged periods of decreased sleep with increased energy. Further clarification regarding the timing, onset, and burden of symptoms will be necessary  for diagnostic clarification. Patient reports she recently started [facility] after discontinuing Abilify  Maintena (used since 2021) due to weight gain. Restarted home Cobenfy per patient request.   PLAN:  Psychiatric Diagnoses and Treatment  # Unspecified mood disorder - Restarted home Cobenfy 100-20 mg 1 capsule daily - Recommend CBT/DBT outpatient  PRN's - Trazodone  50 mg at bedtime as needed for insomnia - Atarax  25 mg TID as needed for anxiety - Agitation Protocol: ativan  + haldol + benadryl   2. Active Medical Issues  #T2DM -Metformin 500 mg BID  #Constipation -Miralax  17 g packet daily  #Nicotine withdrawal - Patient does not need nicotine replacement  Other as needed medications Tylenol  650 mg every 6 hours as needed for pain Mylanta 30 mL every 4 hours as needed for indigestion Milk of magnesia 30 mL daily as needed for constipation Methocarbamol  500 mg daily prn for muscle spasms Albuterol  2 puffs q4h prn for shortness of breath  The risks/benefits/side-effects/alternatives to the above medication(s) were discussed in detail with the patient and time was given for questions.The patient consents to medication trial. FDA black box warnings, if present, were discussed.  3. Safety and Monitoring: - Voluntary admission to inpatient psychiatric unit for safety, stabilization and treatment - Daily contact with patient to assess and evaluate symptoms and progress in treatment - Patient's case to be discussed in multi-disciplinary team meeting - Observation Level: q15 minute checks - Vital signs: q12 hours - Precautions: suicide, elopement, and assault  4. Routine and other pertinent labs: EKG monitoring: QTc: 412  Metabolism / endocrine: BMI: Body mass index is 43.15 kg/m.  CBC: unremarkable CMP: unremarkable UDS: positive for THC Ethanol: <10 UA: unremarkable Pregnancy test: negative TSH: WNL A1c: 5.4 (01/2024) Lipid panel: triglycerides 164, LDL 93  5.    Group Therapy: - Encouraged patient to participate in unit milieu and in scheduled group therapies  - Short Term Goals: Ability to identify and develop effective coping behaviors will improve - Long Term Goals: Improvement in symptoms so as ready for discharge - Patient is encouraged to participate in group therapy while admitted to the psychiatric unit. - We will address other chronic and acute stressors, which contributed to  the patient's MDD (major depressive disorder), severe (HCC) in order to reduce the risk of self-harm at discharge.  7.   Discharge Planning:  - Social work and case management to assist with discharge planning and identification of hospital follow-up needs prior to discharge - Estimated LOS: 5-7 days - Discharge Concerns: Need to establish a safety plan; Medication compliance and effectiveness - Discharge Goals: Return home with outpatient referrals for mental health follow-up including medication management/psychotherapy  I certify that inpatient services furnished can reasonably be expected to improve the patient's condition.  Amanda LOISE Gravely, MD PGY-1, Psychiatry Residency  11/4/20251:47 PM

## 2024-07-24 NOTE — Group Note (Unsigned)
 Date:  07/25/2024 Time:  4:27 AM  Group Topic/Focus:  Wrap-Up Group:   The focus of this group is to help patients review their daily goal of treatment and discuss progress on daily workbooks.    Participation Level:  Active  Participation Quality:  Appropriate and Sharing  Affect:  Appropriate  Cognitive:  Appropriate  Insight: Appropriate  Engagement in Group:  Engaged  Modes of Intervention:  Activity and Socialization  Additional Comments:  Patient completed wrap-up group sheet. Patient said that she is feeling, pretty good. Patient rated her day a 8 out of 10. One high point from patients day, I saw my best friend and one low point, I cried today.  Eward Mace 07/25/2024, 4:27 AM

## 2024-07-25 ENCOUNTER — Encounter (HOSPITAL_COMMUNITY): Payer: Self-pay

## 2024-07-25 MED ORDER — ASPIRIN-ACETAMINOPHEN-CAFFEINE 250-250-65 MG PO TABS
2.0000 | ORAL_TABLET | Freq: Four times a day (QID) | ORAL | Status: DC | PRN
  Administered 2024-07-27: 2 via ORAL
  Filled 2024-07-25: qty 2

## 2024-07-25 MED ORDER — ESCITALOPRAM OXALATE 10 MG PO TABS
10.0000 mg | ORAL_TABLET | Freq: Every day | ORAL | Status: DC
  Administered 2024-07-25 – 2024-07-27 (×3): 10 mg via ORAL
  Filled 2024-07-25 (×3): qty 1

## 2024-07-25 MED ORDER — ACETAMINOPHEN-CAFFEINE 500-65 MG PO TABS: 2.0000 | ORAL_TABLET | Freq: Four times a day (QID) | ORAL | Status: DC | PRN

## 2024-07-25 MED ORDER — ONDANSETRON 4 MG PO TBDP
8.0000 mg | ORAL_TABLET | Freq: Three times a day (TID) | ORAL | Status: DC | PRN
  Administered 2024-07-25 – 2024-07-27 (×3): 8 mg via ORAL
  Filled 2024-07-25 (×3): qty 2

## 2024-07-25 MED ORDER — ONDANSETRON 4 MG PO TBDP
ORAL_TABLET | ORAL | Status: AC
  Filled 2024-07-25: qty 1

## 2024-07-25 NOTE — Group Note (Signed)
 Date:  07/25/2024 Time:  9:45 AM  Group Topic/Focus:  Emotional Education:   The focus of this group is to discuss what feelings/emotions are, and how they are experienced. Goals Group:   The focus of this group is to help patients establish daily goals to achieve during treatment and discuss how the patient can incorporate goal setting into their daily lives to aide in recovery.    Participation Level:  Active  Participation Quality:  Appropriate  Affect:  Appropriate  Cognitive:  Appropriate  Insight: Good  Engagement in Group:  Engaged  Modes of Intervention:  Discussion and Education  Additional Comments:  Pt actively attended group.   Amanda [name] 07/25/2024, 9:45 AM

## 2024-07-25 NOTE — Progress Notes (Signed)
 [facility] Inpatient Psychiatry Progress Note  Date: 07/25/2024 Patient: Amanda [name] MRN: [id number]   ASSESSMENT: Amanda [name] is a 23 y.o. female with a past psychiatric history of ODD, ADHD, DMDD, PTSD, bipolar, MDD presenting with worsening depression and suicidal ideation following a recent break-up with her boyfriend. Her presentation appears most consistent with unspecified mood disorder. Patient exhibits several features concerning for borderline personality disorder, including mood lability, intense emotions, identity disturbance, and fear of abandonment. Although she carries a historical diagnosis of bipolar 1 disorder, she does not appear currently endorse any clear history of discrete manic episodes or prolonged periods of decreased sleep with increased energy.   11/5: Patient with ongoing dysthymic mood and anxiety, but denying suicidal ideation. She continues to demonstrate poor coping skills, mood lability, becoming tearful at various times. Discussed with patient likely diagnosis of borderline personality disorder, to which she was receptive. States she is interested in attending DBT/CBT therapy outpatient. Discussed starting Lexapro for management of suicidal thoughts, anxiety and depression, to which she was agreeable. Medically, patient is complaining of nausea and menstrual cramps. Added Zofran  and Midol  as needed.Yesterday, patient signed 72-hour document, but states she is willing to stay until Saturday for treatment.  PLAN:  Psychiatric Diagnoses and Treatment   # Unspecified mood disorder # Borderline personality disorder - Start Lexapro 10 mg daily for anxiety/depression - Continue home Cobenfy 100-20 mg 1 capsule daily - Recommend CBT/DBT outpatient   PRN's - Trazodone  50 mg at bedtime as needed for insomnia - Atarax  25 mg TID as needed for anxiety -- Melatonin at bedtime for insomnia - Agitation Protocol: ativan  + haldol + benadryl   2.  Active Medical Issues   #T2DM -Metformin 500 mg BID   #Constipation -Miralax  17 g packet daily   #Nicotine withdrawal - Patient does not need nicotine replacement   Other as needed medications Tylenol  650 mg every 6 hours as needed for pain Mylanta 30 mL every 4 hours as needed for indigestion Milk of magnesia 30 mL daily as needed for constipation Methocarbamol  500 mg daily prn for muscle spasms Albuterol  2 puffs q4h prn for shortness of breath Zofran  8 mg q8h prn for nausea   The risks/benefits/side-effects/alternatives to the above medication(s) were discussed in detail with the patient and time was given for questions.The patient consents to medication trial. FDA black box warnings, if present, were discussed.   3. Safety and Monitoring: - Voluntary admission to inpatient psychiatric unit for safety, stabilization and treatment - Daily contact with patient to assess and evaluate symptoms and progress in treatment - Patient's case to be discussed in multi-disciplinary team meeting - Observation Level: q15 minute checks - Vital signs: q12 hours - Precautions: suicide, elopement, and assault   4. Routine and other pertinent labs: EKG monitoring: QTc: 412   Metabolism / endocrine: BMI: Body mass index is 43.15 kg/m.   CBC: unremarkable CMP: unremarkable UDS: positive for THC Ethanol: <10 UA: unremarkable Pregnancy test: negative TSH: WNL A1c: 5.4 (01/2024) Lipid panel: triglycerides 164, LDL 93   5.   Group Therapy: - Encouraged patient to participate in unit milieu and in scheduled group therapies  - Short Term Goals: Ability to identify and develop effective coping behaviors will improve - Long Term Goals: Improvement in symptoms so as ready for discharge - Patient is encouraged to participate in group therapy while admitted to the psychiatric unit. - We will address other chronic and acute stressors, which contributed to the patient's MDD (major depressive  disorder), severe (HCC) in order to reduce the risk of self-harm at discharge.   7.   Discharge Planning:  - Social work and case management to assist with discharge planning and identification of hospital follow-up needs prior to discharge - Estimated LOS: 5-7 days - Discharge Concerns: Need to establish a safety plan; Medication compliance and effectiveness - Discharge Goals: Return home with outpatient referrals for mental health follow-up including medication management/psychotherapy    Risk Assessment: Patient continues to require inpatient hospitalization for safety and stabilization of suicidal ideation and depression.  Discharge Planning: Barriers to Discharge: management of SI/depression Estimated Length of Stay: 4-5 days Predicted Discharge Location: Home  INTERVAL HISTORY: Chart reviewed. No significant events overnight. On assessment today, patient describes her mood as sad, but not suicidal. Patient becomes tearful at points throughout interview. States she wants to go home because she needs to take care of her cats. She is very anxious about her cats being at home by themselves.  Says she does have a friend, Charmaine, who could attend to them, but she does not want to bother her. Reassured patient that her friend more than likely would be supportive, and not view her as a burden. Discussed with patient diagnosis of borderline personality disorder. States she is still interested in attending DBT/CBT therapy outpatient. Discussed starting Lexapro for management of suicidal thoughts and depression, to which patient was agreeable. She denies suicidal thoughts. Denies AVH. Reports sleeping and eating well. Medically, patient is complaining of nausea and menstrual cramps. Patient did sign 72-hour document, but is willing to stay until Saturday for treatment.  PRN's in the last 24 hours include Zofran .  Physical Examination:  Vitals and nursing note reviewed MSK: Normal gait and  station  MENTAL STATUS EXAM:  Appearance: Casually dressed, young Caucasian female  Behavior: Cooperative, tearful at times  Attitude: Pleasant  Speech: Normal rate rhythm and tone  Mood: Dysthymic  Affect: Congruent, constricted  Thought Process: Linear, logical, goal directed  Thought Content: No delusional content endorsed  SI/HI: Denies  Perceptions: Denies AVH  Judgement: Poor  Insight: Fair  Fund of Knowledge: WNL   Lab Results:  No visits with results within 1 Day(s) from this visit.  Latest known visit with results is:  Admission on 07/23/2024, Discharged on 07/23/2024  Component Date Value Ref Range Status   Sodium 07/23/2024 135  135 - 145 mmol/L Final   Potassium 07/23/2024 4.0  3.5 - 5.1 mmol/L Final   Chloride 07/23/2024 106  98 - 111 mmol/L Final   CO2 07/23/2024 19 (L)  22 - 32 mmol/L Final   Glucose, Bld 07/23/2024 131 (H)  70 - 99 mg/dL Final   BUN 88/96/7974 10  6 - 20 mg/dL Final   Creatinine, Ser 07/23/2024 0.82  0.44 - 1.00 mg/dL Final   Calcium 88/96/7974 9.2  8.9 - 10.3 mg/dL Final   Total Protein 88/96/7974 7.0  6.5 - 8.1 g/dL Final   Albumin 88/96/7974 3.8  3.5 - 5.0 g/dL Final   AST 88/96/7974 22  15 - 41 U/L Final   ALT 07/23/2024 19  0 - 44 U/L Final   Alkaline Phosphatase 07/23/2024 45  38 - 126 U/L Final   Total Bilirubin 07/23/2024 0.8  0.0 - 1.2 mg/dL Final   GFR, Estimated 07/23/2024 >60  >60 mL/min Final   Anion gap 07/23/2024 10  5 - 15 Final   Alcohol, Ethyl (B) 07/23/2024 <15  <15 mg/dL Final   WBC 88/96/7974 9.9  4.0 -  10.5 K/uL Final   RBC 07/23/2024 4.48  3.87 - 5.11 MIL/uL Final   Hemoglobin 07/23/2024 13.4  12.0 - 15.0 g/dL Final   HCT 88/96/7974 39.1  36.0 - 46.0 % Final   MCV 07/23/2024 87.3  80.0 - 100.0 fL Final   MCH 07/23/2024 29.9  26.0 - 34.0 pg Final   MCHC 07/23/2024 34.3  30.0 - 36.0 g/dL Final   RDW 88/96/7974 12.3  11.5 - 15.5 % Final   Platelets 07/23/2024 304  150 - 400 K/uL Final   nRBC 07/23/2024 0.0  0.0 -  0.2 % Final   Opiates 07/23/2024 NONE DETECTED  NONE DETECTED Final   Cocaine 07/23/2024 NONE DETECTED  NONE DETECTED Final   Benzodiazepines 07/23/2024 NONE DETECTED  NONE DETECTED Final   Amphetamines 07/23/2024 NONE DETECTED  NONE DETECTED Final   Tetrahydrocannabinol 07/23/2024 POSITIVE (A)  NONE DETECTED Final   Barbiturates 07/23/2024 NONE DETECTED  NONE DETECTED Final   Preg, Serum 07/23/2024 NEGATIVE  NEGATIVE Final     Vitals: Blood pressure 121/83, pulse 92, temperature 98.1 F (36.7 C), temperature source Oral, resp. rate 20, height 5' 4 (1.626 m), weight 114 kg, last menstrual period 06/25/2024, SpO2 98%.    Ashley Gravely, MD PGY-1, Psychiatry Residency  07/25/2024, 1:09 PM

## 2024-07-25 NOTE — Plan of Care (Signed)
   Problem: Education: Goal: Emotional status will improve Outcome: Progressing Goal: Mental status will improve Outcome: Progressing   Problem: Activity: Goal: Interest or engagement in activities will improve Outcome: Progressing Goal: Sleeping patterns will improve Outcome: Progressing   Problem: Safety: Goal: Periods of time without injury will increase Outcome: Progressing

## 2024-07-25 NOTE — Progress Notes (Signed)
 Pt attended groups today and was compliant with medication. Pt has been tearful off and on all day. Pt endorsed increased anxiety and was tearful after talking on the phone with her exboyfriend stating  I dont even know why I'm crying but I can't stop and I need ativan . I just talked to my ex and he is okay, I have been worried because I had a nightmare last night. Pt also tearful after finding out mother could not visit. Pt has noted difficulty with managing emotions. Pt educated and encouraged to utilize coping skills.

## 2024-07-25 NOTE — [facility] Group Notes (Signed)
Patient attended the Pharmacy group. 

## 2024-07-25 NOTE — Group Note (Signed)
 Date:  07/25/2024 Time:  10:37 AM  Group Topic/Focus:  Recreation Therapy     Participation Level:  Active   Amanda [name] [name] 07/25/2024, 10:37 AM

## 2024-07-25 NOTE — BH IP Treatment Plan (Signed)
 Interdisciplinary Treatment and Diagnostic Plan Update  07/25/2024 Time of Session: 10:40 AM Amanda [name] MRN: [id number]  Principal Diagnosis: MDD (major depressive disorder), severe (HCC)  Secondary Diagnoses: Principal Problem:   MDD (major depressive disorder), severe (HCC)   Current Medications:  Current Facility-Administered Medications  Medication Dose Route Frequency Provider Last Rate Last Admin   acetaminophen  (TYLENOL ) tablet 650 mg  650 mg Oral Q6H PRN Coleman, Carolyn H, NP   650 mg at 07/25/24 1003   albuterol  (VENTOLIN  HFA) 108 (90 Base) MCG/ACT inhaler 2 puff  2 puff Inhalation Q4H PRN Mardy Elveria DEL, NP       alum & mag hydroxide-simeth (MAALOX/MYLANTA) 200-200-20 MG/5ML suspension 30 mL  30 mL Oral Q4H PRN Coleman, Carolyn H, NP   30 mL at 07/24/24 0546   aspirin-acetaminophen -caffeine (EXCEDRIN MIGRAINE) per tablet 2 tablet  2 tablet Oral Q6H PRN Chandra Charleston Sherlean Ruth, DO       calcium carbonate (TUMS - dosed in mg elemental calcium) chewable tablet 200 mg of elemental calcium  1 tablet Oral Daily PRN Mardy Elveria DEL, NP       haloperidol (HALDOL) tablet 5 mg  5 mg Oral TID PRN Mardy Elveria DEL, NP       And   diphenhydrAMINE  (BENADRYL ) capsule 50 mg  50 mg Oral TID PRN Mardy Elveria DEL, NP       haloperidol lactate (HALDOL) injection 5 mg  5 mg Intramuscular TID PRN Mardy Elveria DEL, NP       And   diphenhydrAMINE  (BENADRYL ) injection 50 mg  50 mg Intramuscular TID PRN Mardy Elveria DEL, NP       And   LORazepam  (ATIVAN ) injection 2 mg  2 mg Intramuscular TID PRN Mardy Elveria DEL, NP       haloperidol lactate (HALDOL) injection 10 mg  10 mg Intramuscular TID PRN Mardy Elveria DEL, NP       And   diphenhydrAMINE  (BENADRYL ) injection 50 mg  50 mg Intramuscular TID PRN Mardy Elveria DEL, NP       And   LORazepam  (ATIVAN ) injection 2 mg  2 mg Intramuscular TID PRN Mardy Elveria DEL, NP       escitalopram (LEXAPRO) tablet 10 mg  10 mg Oral  Daily Mannie Ashley SAILOR, MD   10 mg at 07/25/24 1150   hydrOXYzine  (ATARAX ) tablet 25 mg  25 mg Oral TID PRN Coleman, Carolyn H, NP       LORazepam  (ATIVAN ) tablet 1 mg  1 mg Oral Daily PRN Coleman, Carolyn H, NP   1 mg at 07/25/24 1612   magnesium  hydroxide (MILK OF MAGNESIA) suspension 30 mL  30 mL Oral Daily PRN Coleman, Carolyn H, NP       melatonin tablet 3 mg  3 mg Oral QHS PRN Onuoha, Chinwendu V, NP   3 mg at 07/24/24 2029   metFORMIN (GLUCOPHAGE) tablet 500 mg  500 mg Oral Q supper Mardy Elveria DEL, NP   500 mg at 07/24/24 2029   methocarbamol  (ROBAXIN ) tablet 500 mg  500 mg Oral Daily PRN Coleman, Carolyn H, NP   500 mg at 07/25/24 1500   ondansetron  (ZOFRAN -ODT) 4 MG disintegrating tablet            ondansetron  (ZOFRAN -ODT) disintegrating tablet 4 mg  4 mg Oral Once McLauchlin, Angela, NP       ondansetron  (ZOFRAN -ODT) disintegrating tablet 8 mg  8 mg Oral Q8H PRN Stevens, Briana N, MD   8 mg  at 07/25/24 1001   polyethylene glycol (MIRALAX  / GLYCOLAX ) packet 17 g  17 g Oral Daily Mannie Ashley SAILOR, MD   17 g at 07/25/24 0741   Xanomeline-Trospium Chloride 100-20 MG CAPS 1 capsule  1 capsule Oral BID Mannie Ashley SAILOR, MD   1 capsule at 07/25/24 0741   PTA Medications: Medications Prior to Admission  Medication Sig Dispense Refill Last Dose/Taking   ABILIFY  MAINTENA 400 MG PRSY prefilled syringe Inject 1 mL (200 mg total) into the muscle every 30 (thirty) days. (Due on 06-27-20): For mood control (Patient not taking: Reported on 07/23/2024)      clindamycin (CLEOCIN) 300 MG capsule Take 300 mg by mouth every 6 (six) hours. (Patient not taking: Reported on 07/23/2024)      COBENFY 100-20 MG CAPS Take 1 capsule by mouth daily.      cyclobenzaprine  (FLEXERIL ) 10 MG tablet Take 1 tablet (10 mg total) by mouth 3 (three) times daily as needed for muscle spasms. (Patient taking differently: Take 10 mg by mouth daily as needed for muscle spasms.) 10 tablet 0    fluconazole  (DIFLUCAN ) 150 MG  tablet Take 1 tablet (150 mg total) by mouth daily. (Patient taking differently: Take 150 mg by mouth daily as needed (yeast infection).) 1 tablet 0    ibuprofen  (ADVIL ) 600 MG tablet Take 600 mg by mouth daily as needed for mild pain (pain score 1-3) or moderate pain (pain score 4-6).      INOSITOL PO Take 1 tablet by mouth in the morning and at bedtime.      LORazepam  (ATIVAN ) 1 MG tablet Take 1 mg by mouth daily as needed for anxiety.      meclizine  (ANTIVERT ) 25 MG tablet Take 1 tablet (25 mg total) by mouth 3 (three) times daily as needed for dizziness or nausea. (Patient not taking: Reported on 07/23/2024) 15 tablet 0    metFORMIN (GLUCOPHAGE) 500 MG tablet Take 500 mg by mouth every evening.      methocarbamol  (ROBAXIN ) 500 MG tablet Take 500 mg by mouth daily as needed for muscle spasms.      naproxen  (NAPROSYN ) 500 MG tablet Take 500 mg by mouth daily as needed for mild pain (pain score 1-3) or moderate pain (pain score 4-6).      ondansetron  (ZOFRAN -ODT) 4 MG disintegrating tablet Take 1 tablet (4 mg total) by mouth every 8 (eight) hours as needed for nausea or vomiting. (Patient taking differently: Take 4 mg by mouth 2 (two) times daily as needed for nausea or vomiting.) 20 tablet 0    traZODone  (DESYREL ) 150 MG tablet Take 150 mg by mouth at bedtime. (Patient not taking: Reported on 07/23/2024)      VENTOLIN  HFA 108 (90 Base) MCG/ACT inhaler INHALE TWO PUFFS INTO THE LUNGS EVERY 4 HOURS AS NEEDED FOR WHEEZING AND SHORTNESS OF BREATH 18 g 0     Patient Stressors: Marital or family conflict    Patient Strengths: Ability for insight  Capable of independent living  Motivation for treatment/growth  Supportive family/friends   Treatment Modalities: Medication Management, Group therapy, Case management,  1 to 1 session with clinician, Psychoeducation, Recreational therapy.   Physician Treatment Plan for Primary Diagnosis: MDD (major depressive disorder), severe (HCC) Long Term Goal(s):      Short Term Goals: Ability to identify and develop effective coping behaviors will improve  Medication Management: Evaluate patient's response, side effects, and tolerance of medication regimen.  Therapeutic Interventions: 1 to 1 sessions, Unit Group  sessions and Medication administration.  Evaluation of Outcomes: Not Progressing  Physician Treatment Plan for Secondary Diagnosis: Principal Problem:   MDD (major depressive disorder), severe (HCC)  Long Term Goal(s):     Short Term Goals: Ability to identify and develop effective coping behaviors will improve     Medication Management: Evaluate patient's response, side effects, and tolerance of medication regimen.  Therapeutic Interventions: 1 to 1 sessions, Unit Group sessions and Medication administration.  Evaluation of Outcomes: Not Progressing   RN Treatment Plan for Primary Diagnosis: MDD (major depressive disorder), severe (HCC) Long Term Goal(s): Knowledge of disease and therapeutic regimen to maintain health will improve  Short Term Goals: Ability to remain free from injury will improve, Ability to verbalize frustration and anger appropriately will improve, Ability to demonstrate self-control, Ability to participate in decision making will improve, Ability to verbalize feelings will improve, Ability to disclose and discuss suicidal ideas, Ability to identify and develop effective coping behaviors will improve, and Compliance with prescribed medications will improve  Medication Management: RN will administer medications as ordered by provider, will assess and evaluate patient's response and provide education to patient for prescribed medication. RN will report any adverse and/or side effects to prescribing provider.  Therapeutic Interventions: 1 on 1 counseling sessions, Psychoeducation, Medication administration, Evaluate responses to treatment, Monitor vital signs and CBGs as ordered, Perform/monitor CIWA, COWS, AIMS and Fall  Risk screenings as ordered, Perform wound care treatments as ordered.  Evaluation of Outcomes: Not Progressing   LCSW Treatment Plan for Primary Diagnosis: MDD (major depressive disorder), severe (HCC) Long Term Goal(s): Safe transition to appropriate next level of care at discharge, Engage patient in therapeutic group addressing interpersonal concerns.  Short Term Goals: Engage patient in aftercare planning with referrals and resources, Increase social support, Increase ability to appropriately verbalize feelings, Increase emotional regulation, Facilitate acceptance of mental health diagnosis and concerns, Facilitate patient progression through stages of change regarding substance use diagnoses and concerns, Identify triggers associated with mental health/substance abuse issues, and Increase skills for wellness and recovery  Therapeutic Interventions: Assess for all discharge needs, 1 to 1 time with Social worker, Explore available resources and support systems, Assess for adequacy in community support network, Educate family and significant other(s) on suicide prevention, Complete Psychosocial Assessment, Interpersonal group therapy.  Evaluation of Outcomes: Not Progressing   Progress in Treatment: Attending groups: Yes. Participating in groups: Yes. Taking medication as prescribed: Yes. Toleration medication: Yes. Family/Significant other contact made: No, will contact:  Mother, Landry Carota [phone] Patient understands diagnosis: Yes. Discussing patient identified problems/goals with staff: Yes. Medical problems stabilized or resolved: Yes. Denies suicidal/homicidal ideation: Yes. Issues/concerns per patient self-inventory: No.  New problem(s) identified:  No  New Short Term/Long Term Goal(s):     medication stabilization, elimination of SI thoughts, development of comprehensive mental wellness plan.   Patient Goals:  I want to find a new hobby.  I want to get better and go  home.  Discharge Plan or Barriers: Patient recently admitted. CSW will continue to follow and assess for appropriate referrals and possible discharge planning.    Reason for Continuation of Hospitalization: Anxiety Depression Medication stabilization Suicidal ideation  Estimated Length of Stay:  5 - 7 days  Last 3 [place] Suicide Severity Risk Score: Flowsheet Row [facility]Current[facility] Most recent reading at 07/23/2024  6:00 PM ED from 07/23/2024 in [facility] Emergency Department at [facility] Most recent reading at 07/23/2024  6:23 AM ED from 07/11/2024 in [facility] Emergency Department at [facility] Most recent reading at 07/11/2024  1:24 PM  C-SSRS RISK CATEGORY High Risk High Risk No Risk    Last PHQ 2/9 Scores:    03/09/2021    4:59 AM  Depression screen PHQ 2/9  Decreased Interest 1  Down, Depressed, Hopeless 2  PHQ - 2 Score 3  Altered sleeping 0  Tired, decreased energy 1  Change in appetite 1  Feeling bad or failure about yourself  1  Trouble concentrating 3  Moving slowly or fidgety/restless 0  Suicidal thoughts 0  PHQ-9 Score 9  Difficult doing work/chores Somewhat difficult    Scribe for Treatment Team: Haja Crego O Lynsee Wands, LCSWA 07/25/2024 5:22 PM

## 2024-07-25 NOTE — Group Note (Signed)
 Recreation Therapy Group Note   Group Topic:Communication  Group Date: 07/25/2024 Start Time: 0940 End Time: 1025 Facilitators: Ivy Meriwether-McCall, LRT,CTRS Location: [address] Dayroom   Group Topic: Communication, Problem Solving   Goal Area(s) Addresses:  Patient will effectively listen to complete activity.  Patient will identify communication skills used to make activity successful.  Patient will identify how skills used during activity can be used to reach post d/c goals.    Behavioral Response:    Intervention: Building Surveyor Activity - Geometric pattern cards, pencils, blank paper    Activity: Geometric Drawings.  Three volunteers from the peer group will be shown an abstract picture with a particular arrangement of geometrical shapes.  Each round, one 'speaker' will describe the pattern, as accurately as possible without revealing the image to the group.  The remaining group members will listen and draw the picture to reflect how it is described to them. Patients with the role of 'listener' cannot ask clarifying questions but, may request that the speaker repeat a direction. Once the drawings are complete, the presenter will show the rest of the group the picture and compare how close each person came to drawing the picture. LRT will facilitate a post-activity discussion regarding effective communication and the importance of planning, listening, and asking for clarification in daily interactions with others.  Education: Environmental consultant, Active listening, Support systems, Discharge planning  Education Outcome: Acknowledges understanding/In group clarification offered/Needs additional education.    Affect/Mood: Appropriate   Participation Level: Engaged   Participation Quality: Independent   Behavior: Appropriate   Speech/Thought Process: Focused   Insight: Good   Judgement: Good   Modes of Intervention: Activity   Patient Response to Interventions:   Engaged   Education Outcome:  In group clarification offered    Clinical Observations/Individualized Feedback: Pt was bright and engaged during activity. Pt was focused and attentive to completing the drawings described to peers.      Plan: Continue to engage patient in RT group sessions 2-3x/week.   Rimsha Trembley-McCall, LRT,CTRS 07/25/2024 1:13 PM

## 2024-07-25 NOTE — Plan of Care (Signed)

## 2024-07-25 NOTE — Progress Notes (Signed)
(  Sleep Hours) -7.5  (Any PRNs that were needed, meds refused, or side effects to meds)- Tylenol  650 , Melatonin 3mg   (Any disturbances and when (visitation, over night)- none  (Concerns raised by the patient)- none, Ask me 3 discussed with pt and verbal understanding stated.    (SI/HI/AVH)- denies

## 2024-07-25 NOTE — Group Note (Signed)
 Date:  07/25/2024 Time:  4:56 PM  Group Topic/Focus:  Dimensions of Wellness:   The focus of this group is to introduce the topic of wellness and discuss the role each dimension of wellness plays in total health.    Participation Level:  Active  Participation Quality:  Monopolizing  Affect:  Labile  Cognitive:  Alert  Insight: Lacking  Engagement in Group:  Monopolizing  Modes of Intervention:  Activity, Discussion, and Education  Additional Comments:    Asberry CROME Teana Lindahl 07/25/2024, 4:56 PM

## 2024-07-25 NOTE — [facility] Suicide Risk Assessment (Signed)
 [facility] INPATIENT:  Family/Significant Other Suicide Prevention Education  Suicide Prevention Education:  Education Completed; Mother, Landry Carota [phone],  (name of family member/significant other) has been identified by the patient as the family member/significant other with whom the patient will be residing, and identified as the person(s) who will aid the patient in the event of a mental health crisis (suicidal ideations/suicide attempt).  With written consent from the patient, the family member/significant other has been provided the following suicide prevention education, prior to the and/or following the discharge of the patient.  Mother states she is pt's legal guardian and is aware to send court paperwork. States since the move to [place] she has not been able to find it.  Does not have weapons or firearms in the home.   Mom plans to call [facility] today to stop ACTT services in order for pt to be able to attend IOP and have individual therapy and medication management. Mom states she has outgrown what [facility] is providing.  Mother will be here at 10AM on Saturday to pick patient up at discharge. Will need a call back if date changes.  The suicide prevention education provided includes the following: Suicide risk factors Suicide prevention and interventions National Suicide Hotline telephone number [facility] assessment telephone number [facility] Emergency Assistance 911 [facility] and/or Residential Mobile Crisis Unit telephone number  Request made of family/significant other to: Remove weapons (e.g., guns, rifles, knives), all items previously/currently identified as safety concern.   Remove drugs/medications (over-the-counter, prescriptions, illicit drugs), all items previously/currently identified as a safety concern.  The family member/significant other verbalizes understanding of the suicide prevention education information provided.  The  family member/significant other agrees to remove the items of safety concern listed above.  Jenkins LULLA Primer 07/25/2024, 3:05 PM

## 2024-07-25 NOTE — [facility] Group Notes (Signed)
 [facility] Group Notes:  (Nursing/MHT/Case Management/Adjunct)  Date:  07/25/2024  Time:  9:25 PM  Type of Therapy:  Wrap-up group  Participation Level:  Active  Participation Quality:  Appropriate  Affect:  Appropriate  Cognitive:  Appropriate  Insight:  Appropriate  Engagement in Group:  Engaged  Modes of Intervention:  Education  Summary of Progress/Problems: Goal to learn her diagnosis. Rated day 8/10.  Amanda [name] Essex 07/25/2024, 9:25 PM

## 2024-07-25 NOTE — [facility] Group Notes (Signed)
 Patient attended the RN group.

## 2024-07-26 NOTE — Care Management Important Message (Signed)
 Medicare IM printed and given to social work to give to the patient. ?

## 2024-07-26 NOTE — Group Note (Signed)
 Date:  07/26/2024 Time:  1:56 PM  Group Topic/Focus:  Emotional Education:   The focus of this group is to discuss what feelings/emotions are, and how they are experienced.    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   Juventino Pavone A Gautham Hewins 07/26/2024, 1:56 PM

## 2024-07-26 NOTE — Group Note (Signed)
 Occupational Therapy Group Note  Group Topic: Sleep Hygiene  Group Date: 07/26/2024 Start Time: 1500 End Time: 1530 Facilitators: Dot Dallas MATSU, OT   Group Description: Group encouraged increased participation and engagement through topic focused on sleep hygiene. Patients reflected on the quality of sleep they typically receive and identified areas that need improvement. Group was given background information on sleep and sleep hygiene, including common sleep disorders. Group members also received information on how to improve one's sleep and introduced a sleep diary as a tool that can be utilized to track sleep quality over a length of time. Group session ended with patients identifying one or more strategies they could utilize or implement into their sleep routine in order to improve overall sleep quality.        Therapeutic Goal(s):  Identify one or more strategies to improve overall sleep hygiene  Identify one or more areas of sleep that are negatively impacted (sleep too much, too little, etc)     Participation Level: Engaged   Participation Quality: Independent   Behavior: Appropriate   Speech/Thought Process: Relevant   Affect/Mood: Appropriate   Insight: Fair   Judgement: Fair      Modes of Intervention: Education  Patient Response to Interventions:  Attentive   Plan: Continue to engage patient in OT groups 2 - 3x/week.  07/26/2024  [place] MATSU Dot, OT   Ramey Ketcherside, OT

## 2024-07-26 NOTE — Progress Notes (Signed)
(  Sleep Hours) -7.75  (Any PRNs that were needed, meds refused, or side effects to meds)- Vistaril  25 mg  (Any disturbances and when (visitation, over night)- none  (Concerns raised by the patient)- none . Ask me 3 discussed with pt and verbal understanding stated.  Pt withdrew her 72 hr request for D/C @ 2053  (SI/HI/AVH)- denies

## 2024-07-26 NOTE — Plan of Care (Signed)
   Problem: Education: Goal: Mental status will improve Outcome: Progressing   Problem: Activity: Goal: Interest or engagement in activities will improve Outcome: Progressing

## 2024-07-26 NOTE — Progress Notes (Signed)
   07/26/24 1400  Psych Admission Type (Psych Patients Only)  Admission Status Voluntary/72 hour document signed  Psychosocial Assessment  Patient Complaints None  Eye Contact Fair  Facial Expression Anxious  Affect Anxious  Speech Logical/coherent  Interaction Assertive  Motor Activity Slow;Restless  Appearance/Hygiene Unremarkable  Behavior Characteristics Cooperative  Mood Anxious;Preoccupied  Thought Process  Coherency WDL  Content WDL  Delusions WDL  Perception WDL  Hallucination None reported or observed  Judgment Impaired  Confusion WDL  Danger to Self  Current suicidal ideation? Denies  Description of Suicide Plan No Plan  Agreement Not to Harm Self Yes  Description of Agreement Verbal contract  Danger to Others  Danger to Others None reported or observed

## 2024-07-26 NOTE — [facility] Group Notes (Signed)
 [facility] Group Notes:  (Nursing/MHT/Case Management/Adjunct)  Date:  07/26/2024  Time:  10:36 PM  Type of Therapy:  Wrap-up group  Participation Level:  Active  Participation Quality:  Appropriate  Affect:  Appropriate  Cognitive:  Appropriate  Insight:  Appropriate  Engagement in Group:  Engaged  Modes of Intervention:  Education  Summary of Progress/Problems: Goal to see her friend. Rated day 10/10.  Amanda [name] 07/26/2024, 10:36 PM

## 2024-07-26 NOTE — [facility] Group Notes (Signed)
 Patient did attend OT group.

## 2024-07-26 NOTE — Group Note (Signed)
 Date:  07/26/2024 Time:  9:33 AM  Group Topic/Focus:  Emotional Education:   The focus of this group is to discuss what feelings/emotions are, and how they are experienced. Goals Group:   The focus of this group is to help patients establish daily goals to achieve during treatment and discuss how the patient can incorporate goal setting into their daily lives to aide in recovery. Orientation:   The focus of this group is to educate the patient on the purpose and policies of crisis stabilization and provide a format to answer questions about their admission.  The group details unit policies and expectations of patients while admitted.    Participation Level:  Active  Participation Quality:  Appropriate  Affect:  Appropriate  Cognitive:  Appropriate  Insight: Good  Engagement in Group:  Engaged  Modes of Intervention:  Discussion and Education  Additional Comments:  Pt actively participated in group.   Logan LITTIE [name] 07/26/2024, 9:33 AM

## 2024-07-26 NOTE — Group Note (Signed)
 LCSW Group Therapy Note   Group Date: 07/26/2024 Start Time: 1100 End Time: 1200   Participation:  patient was present.  Amanda [name].  Type of Therapy:  Group Therapy   Topic:  Stronger Together:  Building Healthy Relationships  Objective:  To explore loneliness, boundaries, and safe ways to build relationships.  Goals: - Recognize healthy vs. unhealthy relationships. - Learn safe ways to connect with others. - Psychiatrist.  Summary:  Participants discussed loneliness, healthy connections, and setting boundaries. They explored safe ways to meet people and shared personal experiences. Key insights were reinforced through [name] and quotes.  Therapeutic Modalities Used: - Cognitive Behavioral Therapy (CBT) Elements - Identifying unhealthy relationship patterns, challenging negative thoughts about connection. - Dialectical Behavior Therapy (DBT) Elements - Interpersonal effectiveness, setting and maintaining boundaries. - Supportive Group Therapy - Peer [name], shared experiences, and emotional validation.   Prentis Langdon O Shavona Gunderman, LCSWA 07/26/2024  4:39 PM

## 2024-07-26 NOTE — Plan of Care (Signed)
   Problem: Education: Goal: Emotional status will improve Outcome: Progressing Goal: Mental status will improve Outcome: Progressing Goal: Verbalization of understanding the information provided will improve Outcome: Progressing   Problem: Activity: Goal: Interest or engagement in activities will improve Outcome: Progressing Goal: Sleeping patterns will improve Outcome: Progressing

## 2024-07-26 NOTE — Progress Notes (Signed)
 [facility] Inpatient Psychiatry Progress Note  Date: 07/26/2024 Patient: Amanda [name] MRN: [id number]   ASSESSMENT: Amanda [name] is a 23 y.o. female with a past psychiatric history of ODD, ADHD, DMDD, PTSD, bipolar, MDD presenting with worsening depression and suicidal ideation following a recent break-up with her boyfriend. Her presentation appears most consistent with unspecified mood disorder. Patient exhibits several features concerning for borderline personality disorder, including mood lability, intense emotions, identity disturbance, and fear of abandonment. Although she carries a historical diagnosis of bipolar 1 disorder, she does not appear currently endorse any clear history of discrete manic episodes or prolonged periods of decreased sleep with increased energy.   11/6: Patient started on Lexapro yesterday, now reporting onset of dizziness.  Otherwise, no other side effects endorsed. Patient continues to feel anxious and is tearful at various times throughout the day. Patient plans to attend IOP after discharge. Continue current plan of care.   PLAN:  Psychiatric Diagnoses and Treatment   # Unspecified mood disorder # Borderline personality disorder - Continue Lexapro 10 mg daily for anxiety/depression - Continue home Cobenfy 100-20 mg 1 capsule daily - Recommend CBT/DBT outpatient - Patient reports she will be pursuing IOP   PRN's - Trazodone  50 mg at bedtime as needed for insomnia - Atarax  25 mg TID as needed for anxiety -- Melatonin at bedtime for insomnia - Agitation Protocol: ativan  + haldol + benadryl   2. Active Medical Issues   #T2DM -Metformin 500 mg BID   #Constipation -Miralax  17 g packet daily   #Nicotine withdrawal - Patient does not need nicotine replacement   Other as needed medications Tylenol  650 mg every 6 hours as needed for pain Mylanta 30 mL every 4 hours as needed for indigestion Milk of magnesia 30 mL daily as  needed for constipation Methocarbamol  500 mg daily prn for muscle spasms Albuterol  2 puffs q4h prn for shortness of breath Zofran  8 mg q8h prn for nausea   The risks/benefits/side-effects/alternatives to the above medication(s) were discussed in detail with the patient and time was given for questions.The patient consents to medication trial. FDA black box warnings, if present, were discussed.   3. Safety and Monitoring: - Voluntary admission to inpatient psychiatric unit for safety, stabilization and treatment - Daily contact with patient to assess and evaluate symptoms and progress in treatment - Patient's case to be discussed in multi-disciplinary team meeting - Observation Level: q15 minute checks - Vital signs: q12 hours - Precautions: suicide, elopement, and assault   4. Routine and other pertinent labs: EKG monitoring: QTc: 412   Metabolism / endocrine: BMI: Body mass index is 43.15 kg/m.   CBC: unremarkable CMP: unremarkable UDS: positive for THC Ethanol: <10 UA: unremarkable Pregnancy test: negative TSH: WNL A1c: 5.4 (01/2024) Lipid panel: triglycerides 164, LDL 93   5.   Group Therapy: - Encouraged patient to participate in unit milieu and in scheduled group therapies  - Short Term Goals: Ability to identify and develop effective coping behaviors will improve - Long Term Goals: Improvement in symptoms so as ready for discharge - Patient is encouraged to participate in group therapy while admitted to the psychiatric unit. - We will address other chronic and acute stressors, which contributed to the patient's MDD (major depressive disorder), severe (HCC) in order to reduce the risk of self-harm at discharge.   Risk Assessment: Patient continues to require inpatient hospitalization for safety and stabilization of suicidal ideation and depression.  Discharge Planning: Barriers to Discharge: management of SI/depression Estimated  Length of Stay: 4-5 days Predicted  Discharge Location: Home on Saturday  INTERVAL HISTORY: Chart reviewed. No significant events overnight. Patient evaluated on the unit. States she does not feel well and is having some dizziness. Discussed with patient this could be from Lexapro but should likely resolve within 1 to 2 weeks. Informed her to let staff know if dizziness worsens. Patient spoke to her mother yesterday. They are interested in patient attending IOP after discharge for management of borderline personality disorder. Patient denies suicidal thoughts. Denies AVH. She is eating and sleeping well.  PRN's in the last 24 hours include Tylenol , melatonin, Ativan .  Physical Examination:  Vitals and nursing note reviewed MSK: Normal gait and station  MENTAL STATUS EXAM:  Appearance: Casually dressed, young Caucasian female  Behavior: Cooperative  Attitude: Pleasant  Speech: Normal rate rhythm and tone  Mood: Anxious  Affect: Congruent, constricted  Thought Process: Linear, logical, goal directed  Thought Content: No delusional content endorsed  SI/HI: Denies  Perceptions: Denies AVH  Judgement: Poor  Insight: Fair  Fund of Knowledge: WNL   Lab Results:  No visits with results within 1 Day(s) from this visit.  Latest known visit with results is:  Admission on 07/23/2024, Discharged on 07/23/2024  Component Date Value Ref Range Status   Sodium 07/23/2024 135  135 - 145 mmol/L Final   Potassium 07/23/2024 4.0  3.5 - 5.1 mmol/L Final   Chloride 07/23/2024 106  98 - 111 mmol/L Final   CO2 07/23/2024 19 (L)  22 - 32 mmol/L Final   Glucose, Bld 07/23/2024 131 (H)  70 - 99 mg/dL Final   BUN 88/96/7974 10  6 - 20 mg/dL Final   Creatinine, Ser 07/23/2024 0.82  0.44 - 1.00 mg/dL Final   Calcium 88/96/7974 9.2  8.9 - 10.3 mg/dL Final   Total Protein 88/96/7974 7.0  6.5 - 8.1 g/dL Final   Albumin 88/96/7974 3.8  3.5 - 5.0 g/dL Final   AST 88/96/7974 22  15 - 41 U/L Final   ALT 07/23/2024 19  0 - 44 U/L Final   Alkaline  Phosphatase 07/23/2024 45  38 - 126 U/L Final   Total Bilirubin 07/23/2024 0.8  0.0 - 1.2 mg/dL Final   GFR, Estimated 07/23/2024 >60  >60 mL/min Final   Anion gap 07/23/2024 10  5 - 15 Final   Alcohol, Ethyl (B) 07/23/2024 <15  <15 mg/dL Final   WBC 88/96/7974 9.9  4.0 - 10.5 K/uL Final   RBC 07/23/2024 4.48  3.87 - 5.11 MIL/uL Final   Hemoglobin 07/23/2024 13.4  12.0 - 15.0 g/dL Final   HCT 88/96/7974 39.1  36.0 - 46.0 % Final   MCV 07/23/2024 87.3  80.0 - 100.0 fL Final   MCH 07/23/2024 29.9  26.0 - 34.0 pg Final   MCHC 07/23/2024 34.3  30.0 - 36.0 g/dL Final   RDW 88/96/7974 12.3  11.5 - 15.5 % Final   Platelets 07/23/2024 304  150 - 400 K/uL Final   nRBC 07/23/2024 0.0  0.0 - 0.2 % Final   Opiates 07/23/2024 NONE DETECTED  NONE DETECTED Final   Cocaine 07/23/2024 NONE DETECTED  NONE DETECTED Final   Benzodiazepines 07/23/2024 NONE DETECTED  NONE DETECTED Final   Amphetamines 07/23/2024 NONE DETECTED  NONE DETECTED Final   Tetrahydrocannabinol 07/23/2024 POSITIVE (A)  NONE DETECTED Final   Barbiturates 07/23/2024 NONE DETECTED  NONE DETECTED Final   Preg, Serum 07/23/2024 NEGATIVE  NEGATIVE Final     Vitals: Blood pressure  125/82, pulse 94, temperature 98.2 F (36.8 C), temperature source Oral, resp. rate 18, height 5' 4 (1.626 m), weight 114 kg, last menstrual period 06/25/2024, SpO2 98%.    Ashley Gravely, MD PGY-1, Psychiatry Residency  07/26/2024, 10:16 AM

## 2024-07-26 NOTE — Group Note (Signed)
 Date:  07/26/2024 Time:  10:37 AM  Group Topic/Focus:  Nutrition Group    Participation Level:  Active   Amanda [name] 07/26/2024, 10:37 AM

## 2024-07-26 NOTE — Group Note (Signed)
 Date:  07/26/2024 Time:  2:25 PM  Group Topic/Focus:  Emotional Education:   The focus of this group is to discuss what feelings/emotions are, and how they are experienced.    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   Mumtaz Lovins A Sherald Balbuena 07/26/2024, 2:25 PM

## 2024-07-26 NOTE — Group Note (Unsigned)
 Date:  07/26/2024 Time:  7:56 AM  Group Topic/Focus:  Coping With Mental Health Crisis:   The purpose of this group is to help patients identify strategies for coping with mental health crisis.  Group discusses possible causes of crisis and ways to manage them effectively.     Participation Level:  {[facility] PARTICIPATION OZCZO:77735}  Participation Quality:  {[facility] PARTICIPATION QUALITY:22265}  Affect:  {B[facility] AFFECT:22266}  Cognitive:  {[facility] COGNITIVE:22267}  Insight: {BHH Insight2:20797}  Engagement in Group:  {[facility] ENGAGEMENT IN HMNLE:77731}  Modes of Intervention:  {B[facility] MODES OF INTERVENTION:22269}  Additional Comments:  ***  Jaquetta Currier E Bravery Ketcham 07/26/2024, 7:56 AM

## 2024-07-27 DIAGNOSIS — F603 Borderline personality disorder: Secondary | ICD-10-CM | POA: Diagnosis present

## 2024-07-27 MED ORDER — SENNOSIDES-DOCUSATE SODIUM 8.6-50 MG PO TABS
2.0000 | ORAL_TABLET | Freq: Two times a day (BID) | ORAL | Status: DC | PRN
  Administered 2024-07-27: 2 via ORAL
  Filled 2024-07-27: qty 2

## 2024-07-27 MED ORDER — ESCITALOPRAM OXALATE 10 MG PO TABS: 10.0000 mg | ORAL_TABLET | Freq: Every day | ORAL | Status: DC

## 2024-07-27 NOTE — Group Note (Signed)
 Date:  07/27/2024 Time:  3:51 PM  Group Topic/Focus:  Overcoming Stress:   The focus of this group is to define stress and help patients assess their triggers.    Participation Level:  Active  Participation Quality:  Appropriate  Affect:  Appropriate  Cognitive:  Appropriate  Insight: Appropriate  Engagement in Group:  Engaged  Modes of Intervention:  Discussion  Amanda [name]  [name] 07/27/2024, 3:51 PM

## 2024-07-27 NOTE — Progress Notes (Signed)
 Tour of Duty:  Prentice JINNY Angle, RN, 07/27/24, Tour of Duty: 0700-1900  SI/HI/AVH: Denies  Self-Reported   Mood: Positive  Anxiety: Denies, but Observable Depression: Denies Irritability: Denies  Broset  Violence Prevention Guidelines *See Row Information*: Small Violence Risk interventions implemented   LBM  Last BM Date : 07/24/24   Pain: present, PRN provided (see MAR)  Patient Refusals (including Rx): No  >>Shift Summary: Patient observed to be animated on unit. Patient able to make needs known. Patient frequently seeks out staff and Rx with possibly somatic concerns. Patient observed to engage appropriately with staff and peers. Patient taking medications as prescribed. This shift, PRN medication requested or required. No reported or observed side effects to medication. No reported or observed agitation, aggression, or other acute emotional distress. Patient reported she is starting her menses. Patient also reported feeling woozy, blood pressure unremarkable. No additional reported or observed physical abnormalities or concerns.  Last Vitals  Vitals Weight: 114 kg Temp: 97.8 F (36.6 C) Temp Source: Oral Pulse Rate: 92 Resp: 18 BP: (!) 122/90 Patient Position: (not recorded)  Admission Type  Psych Admission Type (Psych Patients Only) Admission Status: Voluntary Date 72 hour document signed : (not recorded) Time 72 hour document signed : (not recorded) Provider Notified (First and Last Name) (see details for LINK to note): (not recorded)   Psychosocial Assessment  Psychosocial Assessment Patient Complaints: None Eye Contact: Fair Facial Expression: Anxious Affect: Anxious Speech: Logical/coherent Interaction: Needy Motor Activity: Other (Comment) (WDL) Appearance/Hygiene: Unremarkable Behavior Characteristics: Cooperative Mood: Anxious, Preoccupied   Aggressive Behavior  Targets: (not recorded)   Thought Process  Thought Process Coherency: Within  Defined Limits Content: Preoccupation Delusions: None reported or observed Perception: Within Defined Limits Hallucination: None reported or observed Judgment: Limited Confusion: None  Danger to Self/Others  Danger to Self Current suicidal ideation?: Denies Description of Suicide Plan: (not recorded) Self-Injurious Behavior: 1 Agreement Not to Harm Self: (not recorded) Description of Agreement: (not recorded) Danger to Others: None reported or observed

## 2024-07-27 NOTE — Plan of Care (Signed)
   Problem: Education: Goal: Emotional status will improve Outcome: Progressing Goal: Mental status will improve Outcome: Progressing

## 2024-07-27 NOTE — Group Note (Signed)
 Recreation Therapy Group Note   Group Topic:Leisure Education  Group Date: 07/27/2024 Start Time: 0940 End Time: 1012 Facilitators: Jayne Peckenpaugh-McCall, LRT,CTRS Location: [address] Dayroom   Group Topic: Leisure Education  Goal Area(s) Addresses:  Patient will identify positive leisure activities.  Patient will identify one positive benefit of participation in leisure activities.   Behavioral Response: Engaged  Intervention: Leisure Group Game  Activity: Patient, MHT, and LRT participated in playing a trivia game of Guess the [place]. Teams took turns spinning the wheel. Whatever category ([address] Pop, Dance, Hip Hop, Indie and R&B) the wheel landed on, LRT read the lyric and that team would guess the missing lyric. The team with the most points wins the game.  Education:  Leisure Exposure, Pharmacist, Community, Discharge Planning  Education Outcome: Acknowledges education/In group clarification offered/Needs additional education   Affect/Mood: Appropriate   Participation Level: Engaged   Participation Quality: Independent   Behavior: Appropriate   Speech/Thought Process: Focused   Insight: Good   Judgement: Good   Modes of Intervention: Competitive Play   Patient Response to Interventions:  Engaged   Education Outcome:  In group clarification offered    Clinical Observations/Individualized Feedback: Pt attended and actively participated in group session.      Plan: Continue to engage patient in RT group sessions 2-3x/week.   Yasmina Chico-McCall, LRT,CTRS  07/27/2024 12:06 PM

## 2024-07-27 NOTE — Group Note (Signed)
 Date:  07/27/2024 Time:  1:45 PM  Group Topic/Focus: Social Wellness Developing a Wellness Toolbox:   The focus of this group is to help patients develop a wellness toolbox with skills and strategies to promote recovery upon discharge.    Participation Level:  Minimal  Participation Quality:  Drowsy  Affect:  Appropriate  Cognitive:  Appropriate  Insight: Good  Engagement in Group:  Engaged  Modes of Intervention:  Problem-solving  Additional Comments:  Patient attended  [employer] 07/27/2024, 1:45 PM

## 2024-07-27 NOTE — Group Note (Signed)
 Date:  07/27/2024 Time:  10:53 AM  Group Topic/Focus: Self-Regulation assessment and goals orientation  Goals Group:   The focus of this group is to help patients establish daily goals to achieve during treatment and discuss how the patient can incorporate goal setting into their daily lives to aide in recovery. Orientation:   The focus of this group is to educate the patient on the purpose and policies of crisis stabilization and provide a format to answer questions about their admission.  The group details unit policies and expectations of patients while admitted.    Participation Level:  Active  Participation Quality:  Appropriate  Affect:  Appropriate  Cognitive:  Alert and Appropriate  Insight: Appropriate  Engagement in Group:  Engaged and Improving  Modes of Intervention:  Orientation  Additional Comments:  Patient did attend and was engaged with activity.  Dhaval Woo M Persephonie Hegwood 07/27/2024, 10:53 AM

## 2024-07-27 NOTE — Group Note (Signed)
 Date:  07/27/2024 Time:  1:15 PM  Group Topic/Focus: Recreational Therapy Patient use their thinking skills to guess the lyrics of the song     Participation Level:  Active  Participation Quality:  Appropriate  Affect:  Appropriate  Cognitive:  Alert and Appropriate  Insight: Appropriate and Good  Engagement in Group:  Engaged  Modes of Intervention:  Problem-solving  Additional Comments:  Patient attended group  [employer] 07/27/2024, 1:15 PM

## 2024-07-27 NOTE — Progress Notes (Signed)
 [facility] Inpatient Psychiatry Progress Note  Date: 07/27/2024 Patient: Amanda [name] MRN: [id number]   ASSESSMENT: Jacelyn Cuen is a 23 y.o. female with a past psychiatric history of ODD, ADHD, DMDD, PTSD, bipolar, MDD presenting with worsening depression and suicidal ideation following a recent break-up with her boyfriend. Her presentation appears most consistent with unspecified mood disorder. Patient exhibits several features concerning for borderline personality disorder, including mood lability, intense emotions, identity disturbance, and fear of abandonment. Although she carries a historical diagnosis of bipolar 1 disorder, she does not appear currently endorse any clear history of discrete manic episodes or prolonged periods of decreased sleep with increased energy.   11/7: Patient with improved mood, stating she feels pretty good. Reporting dizziness is improved since yesterday. Does endorse some fatigue, which may be related to Lexapro, therefore changed time of administration to bedtime. Continue current plan of care. Plan is for discharge home Saturday morning.   PLAN:  Psychiatric Diagnoses and Treatment   # Unspecified mood disorder # Borderline personality disorder - Continue Lexapro 10 mg at bedtime for anxiety/depression - Continue home Cobenfy 100-20 mg 1 capsule daily - Recommend CBT/DBT outpatient - Patient reports she will be pursuing IOP   PRN's - Trazodone  50 mg at bedtime as needed for insomnia - Atarax  25 mg TID as needed for anxiety -- Melatonin at bedtime for insomnia - Agitation Protocol: ativan  + haldol + benadryl   2. Active Medical Issues   #T2DM -Metformin 500 mg BID   #Constipation -Miralax  17 g packet daily -Senokot twice daily as needed   #Nicotine withdrawal - Patient does not need nicotine replacement   Other as needed medications Tylenol  650 mg every 6 hours as needed for pain Mylanta 30 mL every 4 hours as  needed for indigestion Milk of magnesia 30 mL daily as needed for constipation Methocarbamol  500 mg daily prn for muscle spasms Albuterol  2 puffs q4h prn for shortness of breath Zofran  8 mg q8h prn for nausea   The risks/benefits/side-effects/alternatives to the above medication(s) were discussed in detail with the patient and time was given for questions.The patient consents to medication trial. FDA black box warnings, if present, were discussed.   3. Safety and Monitoring: - Voluntary admission to inpatient psychiatric unit for safety, stabilization and treatment - Daily contact with patient to assess and evaluate symptoms and progress in treatment - Patient's case to be discussed in multi-disciplinary team meeting - Observation Level: q15 minute checks - Vital signs: q12 hours - Precautions: suicide, elopement, and assault   4. Routine and other pertinent labs: EKG monitoring: QTc: 412   Metabolism / endocrine: BMI: Body mass index is 43.15 kg/m.   CBC: unremarkable CMP: unremarkable UDS: positive for THC Ethanol: <10 UA: unremarkable Pregnancy test: negative TSH: WNL A1c: 5.4 (01/2024) Lipid panel: triglycerides 164, LDL 93   5.   Group Therapy: - Encouraged patient to participate in unit milieu and in scheduled group therapies  - Short Term Goals: Ability to identify and develop effective coping behaviors will improve - Long Term Goals: Improvement in symptoms so as ready for discharge - Patient is encouraged to participate in group therapy while admitted to the psychiatric unit. - We will address other chronic and acute stressors, which contributed to the patient's MDD (major depressive disorder), severe (HCC) in order to reduce the risk of self-harm at discharge.   Risk Assessment: Patient continues to require inpatient hospitalization for safety and stabilization of suicidal ideation and depression.  Discharge Planning:  Barriers to Discharge: management of  SI/depression Estimated Length of Stay: 4-5 days Predicted Discharge Location: Home on Saturday  INTERVAL HISTORY: Chart reviewed. No significant events overnight. Patient evaluated on the unit. Patient describes mood as pretty good today. Reports improved dizziness. Does note increased fatigue. Discussed with patient this may be related to Lexapro and we could move it to bedtime. Patient denies SI/HI. Patient denies AVH. She's eating and sleeping fine.  PRN's in the last 24 hours include vistaril   Physical Examination:  Vitals and nursing note reviewed MSK: Normal gait and station  MENTAL STATUS EXAM:  Appearance: Casually dressed, young Caucasian female  Behavior: Cooperative  Attitude: Pleasant  Speech: Normal rate rhythm and tone  Mood: Anxious  Affect: Congruent, constricted  Thought Process: Linear, logical, goal directed  Thought Content: No delusional content endorsed  SI/HI: Denies  Perceptions: Denies AVH  Judgement: Fair  Insight: Fair  Fund of Knowledge: WNL   Lab Results:  No visits with results within 1 Day(s) from this visit.  Latest known visit with results is:  Admission on 07/23/2024, Discharged on 07/23/2024  Component Date Value Ref Range Status   Sodium 07/23/2024 135  135 - 145 mmol/L Final   Potassium 07/23/2024 4.0  3.5 - 5.1 mmol/L Final   Chloride 07/23/2024 106  98 - 111 mmol/L Final   CO2 07/23/2024 19 (L)  22 - 32 mmol/L Final   Glucose, Bld 07/23/2024 131 (H)  70 - 99 mg/dL Final   BUN 88/96/7974 10  6 - 20 mg/dL Final   Creatinine, Ser 07/23/2024 0.82  0.44 - 1.00 mg/dL Final   Calcium 88/96/7974 9.2  8.9 - 10.3 mg/dL Final   Total Protein 88/96/7974 7.0  6.5 - 8.1 g/dL Final   Albumin 88/96/7974 3.8  3.5 - 5.0 g/dL Final   AST 88/96/7974 22  15 - 41 U/L Final   ALT 07/23/2024 19  0 - 44 U/L Final   Alkaline Phosphatase 07/23/2024 45  38 - 126 U/L Final   Total Bilirubin 07/23/2024 0.8  0.0 - 1.2 mg/dL Final   GFR, Estimated 07/23/2024  >60  >60 mL/min Final   Anion gap 07/23/2024 10  5 - 15 Final   Alcohol, Ethyl (B) 07/23/2024 <15  <15 mg/dL Final   WBC 88/96/7974 9.9  4.0 - 10.5 K/uL Final   RBC 07/23/2024 4.48  3.87 - 5.11 MIL/uL Final   Hemoglobin 07/23/2024 13.4  12.0 - 15.0 g/dL Final   HCT 88/96/7974 39.1  36.0 - 46.0 % Final   MCV 07/23/2024 87.3  80.0 - 100.0 fL Final   MCH 07/23/2024 29.9  26.0 - 34.0 pg Final   MCHC 07/23/2024 34.3  30.0 - 36.0 g/dL Final   RDW 88/96/7974 12.3  11.5 - 15.5 % Final   Platelets 07/23/2024 304  150 - 400 K/uL Final   nRBC 07/23/2024 0.0  0.0 - 0.2 % Final   Opiates 07/23/2024 NONE DETECTED  NONE DETECTED Final   Cocaine 07/23/2024 NONE DETECTED  NONE DETECTED Final   Benzodiazepines 07/23/2024 NONE DETECTED  NONE DETECTED Final   Amphetamines 07/23/2024 NONE DETECTED  NONE DETECTED Final   Tetrahydrocannabinol 07/23/2024 POSITIVE (A)  NONE DETECTED Final   Barbiturates 07/23/2024 NONE DETECTED  NONE DETECTED Final   Preg, Serum 07/23/2024 NEGATIVE  NEGATIVE Final     Vitals: Blood pressure (!) 122/90, pulse 92, temperature 97.8 F (36.6 C), temperature source Oral, resp. rate 18, height 5' 4 (1.626 m), weight 114 kg, last  menstrual period 06/25/2024, SpO2 100%.    Ashley Gravely, MD PGY-1, Psychiatry Residency  07/27/2024, 11:38 AM

## 2024-07-27 NOTE — Care Management Important Message (Signed)
 Patient informed of right to appeal discharge, provided phone number to [facility]. Patient expressed no interest in appealing discharge at this time. CSW will continue to monitor situation.

## 2024-07-27 NOTE — Plan of Care (Signed)
   Problem: Education: Goal: Knowledge of [place] [facility] Education information/materials will improve Outcome: Progressing Goal: Emotional status will improve Outcome: Progressing Goal: Mental status will improve Outcome: Progressing

## 2024-07-28 DIAGNOSIS — F329 Major depressive disorder, single episode, unspecified: Secondary | ICD-10-CM

## 2024-07-28 DIAGNOSIS — F39 Unspecified mood [affective] disorder: Secondary | ICD-10-CM

## 2024-07-28 DIAGNOSIS — F603 Borderline personality disorder: Secondary | ICD-10-CM

## 2024-07-28 MED ORDER — COBENFY 100-20 MG PO CAPS: 1.0000 | ORAL_CAPSULE | Freq: Two times a day (BID) | ORAL | 0 refills | Status: AC

## 2024-07-28 MED ORDER — ESCITALOPRAM OXALATE 10 MG PO TABS: 10.0000 mg | ORAL_TABLET | Freq: Every day | ORAL | 0 refills | Status: AC

## 2024-07-28 NOTE — Progress Notes (Signed)
 Discharge Note:  Patient denies SI/HI/AVH at this time. Discharge instructions, AVS, prescriptions, and transition record gone over with patient. Patient agrees to comply with medication management, follow-up visit, and outpatient therapy. Patient belongings returned to patient. Patient questions and concerns addressed and answered. Pts 2 home rx gave back to patient metformin Cobenfy. Gave used Albuterol  inhaler to patient as well
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
## 2024-07-28 NOTE — BHH Group Notes (Signed)
 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 Rated day 10/10.  Grayce LITTIE Essex 07/28/2024, 4:34 AM

## 2024-07-28 NOTE — Group Note (Signed)
 Date:  07/28/2024 Time:  9:31 AM  Group Topic/Focus: Goals Group  Patients participated in a goals group focused on setting SMART goals related to recovery. The group began with an psychiatrist using a beach ball with questions to promote engagement and build rapport. Patients then reviewed the components of SMART goals and completed a goals worksheet individually. Participants were encouraged to share their personal SMART goals with the group to promote insight, accountability, and peer support.  Participation Level:  Did Not Attend  Participation Quality:  N/A  Affect:  N/A  Cognitive:  N/A  Insight: None  Engagement in Group:  N/A  Modes of Intervention:  N/A  Additional Comments:  Lynia did not attend goals group.  Kristi HERO Amaurie Schreckengost 07/28/2024, 9:31 AM

## 2024-07-28 NOTE — Progress Notes (Signed)
  [facility] Adult Case Management Discharge Plan :  Will you be returning to the same living situation after discharge:  Yes,  Private Residence At discharge, do you have transportation home?: Yes,  Mother Do you have the ability to pay for your medications: Yes,  Pt has medical coverage  Release of information consent forms completed and in the chart;  Patient's signature needed at discharge.  Patient to Follow up at:  Follow-up Information     [facility] Follow up.   Why: Please continue with this provider for ACTT services. Contact information: [address] [place] [address] [phone]         [facility] Follow up on 07/30/2024.   Specialty: Behavioral Health Why: Please call this provider on 07/30/24 at 9:00 am, to schedule an assessment for the intensive outpatient therapy program. You may call directly to [phone]. Contact information: [address] [place]  [address] [phone]        [address] Follow up.   Why: You may call this provider to schedule an appointment for CBT or DBT therapy services. Contact information: [address] [place] [address] [phone]                 Next level of care provider has access to [facility] Link:no  Safety Planning and Suicide Prevention discussed: Yes,  Completed with Mother on 07/25/24     Has patient been referred to the Quitline?: Patient does not use tobacco/nicotine products  Patient has been referred for addiction treatment: No known substance use disorder.  Narcissus Detwiler D Marylou Wages, LCSW 07/28/2024, 9:14 AM

## 2024-07-28 NOTE — Progress Notes (Signed)
(  Sleep Hours) -7.75 (Any PRNs that were needed, meds refused, or side effects to meds)- prn hydroxyzine , ativan , and melatonin @ 2056 (Any disturbances and when (visitation, over night)-none (Concerns raised by the patient)- none (SI/HI/AVH)- denies all

## 2024-07-28 NOTE — Plan of Care (Signed)
   Problem: Education: Goal: Knowledge of [place] [facility] Education information/materials will improve Outcome: Progressing

## 2024-07-28 NOTE — Discharge Summary (Signed)
 Physician Discharge Summary Note  Patient:  Amanda [name] is an 23 y.o., female MRN:  [id number] DOB:  2001/05/10 Patient phone:  [phone] (home)  Patient address:   [address] [place] [address],  Total Time spent with patient: 20 minutes  Date of Admission:  07/23/2024 Date of Discharge: 07/28/2024  Reason for Admission: Patient initially arrived to [facility] on 07/23/2024 for suicidal ideation and was admitted to [facility] voluntarily on 07/24/24 for acute safety concerns and crisis stabalization.   Principal Problem: MDD (major depressive disorder), severe (HCC) Discharge Diagnoses: Principal Problem:   MDD (major depressive disorder), severe (HCC) Active Problems:   PTSD (post-traumatic stress disorder)   Borderline personality disorder (HCC)  Past Psychiatric History:  Current psychiatrist: [facility], Dr. Briant  Current therapist: None Previous psychiatric diagnoses: ODD, ADHD, DMDD, PTSD, bipolar, MDD, ASD (reportedly high functioning, but unofficially diagnosed) Psychiatric Medications:             -Home Meds: Cobenfy, Ativan  1 mg daily as needed              -Past Med Trials: Abilify  Maintena, Seroquel, lithium , Focalin , Celexa , trazodone , Clozapine , Vyvanse  Psychiatric hospitalization(s): Multiple hospitalizations especially when she was an adolescent Psychotherapy history: was attending therapy 4 years ago, but has stopped  Neuromodulation history: none History of suicide (obtained from HPI): per chart review, patient reported attempting multiple times in the past by hanging herself and cutting History of homicide or aggression (obtained in HPI): denies NSSIB: yes, cutting.  Endorses last cutting yesterday  Past Medical History:  Past Medical History:  Diagnosis Date   Asthma    Attention deficit hyperactivity disorder    Autism    Depression    Eczema    H/O hidradenitis suppurativa    Heart murmur    Mood disorder    Oppositional defiant disorder      Past Surgical History:  Procedure Laterality Date   ADENOIDECTOMY     BREAST CYST EXCISION Left 05/27/2023   Procedure: Left breast cyst excision;  Surgeon: Waddell Leonce [place], MD;  Location: [facility];  Service: Plastics;  Laterality: Left;   TYMPANOSTOMY TUBE PLACEMENT     Family History:  Family History  Problem Relation Age of Onset   Food Allergy  Sister    Family Psychiatric  History: Mother- complex PTSD, MDD, GAD, father has on his side schizophrenia. Denies and suicide in family, but mother has had multiple attempts   Social History:  Developmental Hx: ADHD. Reports between the ages of 39 and 56 growing up and several group homes in therapeutic foster homes. Reports having severe anger issues as a child. Educational Hx: 7th grade Occupational Hx: unemployed receives SSI Legal Hx: denies Living Situation: lives alone  Spiritual Hx: deferred  Access to weapons/lethal means: denies Social History   Substance and Sexual Activity  Alcohol Use No     Social History   Substance and Sexual Activity  Drug Use Yes   Types: Marijuana   Comment: > 24hrs    Social History   Socioeconomic History   Marital status: Single    Spouse name: Not on file   Number of children: Not on file   Years of education: Not on file   Highest education level: Not on file  Occupational History   Not on file  Tobacco Use   Smoking status: Never    Passive exposure: Current   Smokeless tobacco: Never  Vaping Use   Vaping status: Every  Day   Substances: Nicotine, Flavoring  Substance and Sexual Activity   Alcohol use: No   Drug use: Yes    Types: Marijuana    Comment: > 24hrs   Sexual activity: Yes    Birth control/protection: Condom, I.U.D.  Other Topics Concern   Not on file  Social History Narrative   Not on file   Social Drivers of Health   Financial Resource Strain: Not on file  Food Insecurity: No Food Insecurity (07/23/2024)   Hunger Vital Sign     Worried About Running Out of Food in the Last Year: Never true    Ran Out of Food in the Last Year: Never true  Transportation Needs: No Transportation Needs (07/23/2024)   PRAPARE - Administrator, Civil Service (Medical): No    Lack of Transportation (Non-Medical): No  Physical Activity: Not on file  Stress: Not on file  Social Connections: Unknown (01/31/2022)   Received from [facility]   Social Network    Social Network: Not on file    Hospital Course:  During the patient's hospitalization, patient had extensive initial psychiatric evaluation, and follow-up psychiatric evaluations every day.  Patient initially presented with suicidal thoughts and worsening depression.  Patient was diagnosed with borderline personality disorder, and started Lexapro 10 mg and restarted home Cobenfy 100-20 mg 2 capsules daily.   Psychiatric diagnoses provided upon initial assessment: Borderline personality disorder  Patient's psychiatric medications were adjusted on admission: Xanomeline-Trospium and Lexapro  During the hospitalization, other adjustments were made to the patient's psychiatric medication regimen: Xanomeline-Trospium and Lexapro  Patient's care was discussed during the interdisciplinary team meeting every day during the hospitalization.  The patient denies having side effects to prescribed psychiatric medication.  Gradually, patient started adjusting to milieu. The patient was evaluated each day by a clinical provider to ascertain response to treatment. Improvement was noted by the patient's report of decreasing symptoms, improved sleep and appetite, affect, medication tolerance, behavior, and participation in unit programming.  Patient was asked each day to complete a self inventory noting mood, mental status, pain, new symptoms, anxiety and concerns.   Symptoms were reported as significantly decreased or resolved completely by discharge.  The patient reports that their  mood is stable.  The patient denied having suicidal thoughts for more than 48 hours prior to discharge.  Patient denies having homicidal thoughts.  Patient denies having auditory hallucinations.  Patient denies any visual hallucinations or other symptoms of psychosis.  The patient was motivated to continue taking medication with a goal of continued improvement in mental health.   The patient reports their target psychiatric symptoms of depression and anxiety responded well to the psychiatric medications, and the patient reports overall benefit other psychiatric hospitalization. Supportive psychotherapy was provided to the patient. The patient also participated in regular group therapy while hospitalized. Coping skills, problem solving as well as relaxation therapies were also part of the unit programming.  Labs were reviewed with the patient, and abnormal results were discussed with the patient.  The patient is able to verbalize their individual safety plan to this provider.  # It is recommended to the patient to continue psychiatric medications as prescribed, after discharge from the hospital.    # It is recommended to the patient to follow up with your outpatient psychiatric provider and PCP.  # It was discussed with the patient, the impact of alcohol, drugs, tobacco have been there overall psychiatric and medical wellbeing, and total abstinence from substance use  was recommended the patient.ed.  # Prescriptions provided or sent directly to preferred pharmacy at discharge. Patient agreeable to plan. Given opportunity to ask questions. Appears to feel comfortable with discharge.    # In the event of worsening symptoms, the patient is instructed to call the crisis hotline, 911 and or go to the nearest ED for appropriate evaluation and treatment of symptoms. To follow-up with primary care provider for other medical issues, concerns and or health care needs  # Patient was discharged home with a  plan to follow up as noted below.  On day of discharge, patient endorses good sleep and curbed appetite, that is normal for her.  Patient says her mother is working on getting outpatient therapy (IOP) set up with this patient.  She says she is excited to get home and is happy to see her cats.  She is denying any side effects from these medications, and is able to verbalize medication plan, and plan for outpatient follow-up.  Physical Findings: AIMS:  , ,  ,  ,  ,  ,   CIWA:    COWS:     Musculoskeletal: Strength & Muscle Tone: within normal limits Gait & Station: normal Patient leans: N/A  Psychiatric Specialty Exam:  Presentation  General Appearance:  Appropriate for Environment  Eye Contact: Good (intense)  Speech: Clear and Coherent; Normal Rate  Speech Volume: Normal  Handedness: Right  Mood and Affect  Mood: -- (happy)  Affect: Appropriate; Congruent; Full Range  Thought Process  Thought Processes: Coherent; Goal Directed; Linear  Descriptions of Associations:Intact  Orientation:Full (Time, Place and Person)  Thought Content:WDL; Logical  History of Schizophrenia/Schizoaffective disorder:No data recorded Duration of Psychotic Symptoms:No data recorded Hallucinations:Hallucinations: None  Ideas of Reference:None  Suicidal Thoughts:Suicidal Thoughts: No  Homicidal Thoughts:Homicidal Thoughts: No  Sensorium  Memory: Immediate Fair  Judgment: Fair  Insight: Fair   Executive Functions  Concentration: Good  Attention Span: Good  Recall: Good  Fund of Knowledge: Good  Language: Good   Psychomotor Activity  Psychomotor Activity: Psychomotor Activity: Tremor   Assets  Assets: Communication Skills; Physical Health; Desire for Improvement; Resilience; Social Support   Sleep  Sleep: Sleep: Good  Estimated Sleeping Duration (Last 24 Hours): 5.25-8.25 hours (Due to Daylight Saving Time, the durations displayed may not  accurately represent documentation during the time change interval)   Physical Exam: Physical Exam Constitutional:      General: She is not in acute distress.    Appearance: She is not ill-appearing or toxic-appearing.  Pulmonary:     Effort: Pulmonary effort is normal.  Neurological:     General: No focal deficit present.     Mental Status: She is alert.    Review of Systems  Gastrointestinal:  Negative for diarrhea and vomiting.  Neurological:  Positive for tremors. Negative for dizziness and headaches.  Psychiatric/Behavioral:  Negative for hallucinations and suicidal ideas.    Blood pressure 116/86, pulse 83, temperature 98 F (36.7 C), temperature source Oral, resp. rate 12, height 5' 4 (1.626 m), weight 114 kg, last menstrual period 06/25/2024, SpO2 100%. Body mass index is 43.15 kg/m.   Social History   Tobacco Use  Smoking Status Never   Passive exposure: Current  Smokeless Tobacco Never   Tobacco Cessation:  N/A, patient does not currently use tobacco products   Blood Alcohol level:  Lab Results  Component Value Date   [facility] <15 07/23/2024   ETH <10 06/02/2020    Metabolic Disorder Labs:  Lab  Results  Component Value Date   HGBA1C 5.0 05/05/2020   MPG 96.8 05/05/2020   MPG 103 06/18/2009   Lab Results  Component Value Date   PROLACTIN 33.1 (H) 06/02/2020   Lab Results  Component Value Date   CHOL 148 06/02/2020   TRIG 117 06/02/2020   HDL 38 (L) 06/02/2020   CHOLHDL 3.9 06/02/2020   VLDL 23 06/02/2020   LDLCALC 87 06/02/2020   LDLCALC 125 (H) 05/05/2020    See Psychiatric Specialty Exam and Suicide Risk Assessment completed by Attending Physician prior to discharge.  Discharge destination:  Home  Is patient on multiple antipsychotic therapies at discharge:  No   Has Patient had three or more failed trials of antipsychotic monotherapy by history:  No  Recommended Plan for Multiple Antipsychotic Therapies: NA  Discharge Instructions      Diet - low sodium heart healthy   Complete by: As directed    Increase activity slowly   Complete by: As directed       Allergies as of 07/28/2024       Reactions   Poison Ivy Extract [poison Ivy Extract] Hives, Itching, Rash   Sulfa Antibiotics Itching, Swelling, Rash   Bee Pollen Swelling   Swelling of throat, congestion    Fluoxetine Hives   Kiwi Extract Other (See Comments)   Tingling of tongue   Molds & Smuts    Latex Rash   Wound Dressing Adhesive Rash   Pt reports she is allergic to Medical Adhesive        Medication List     STOP taking these medications    Abilify  Maintena 400 MG Prsy prefilled syringe Generic drug: ARIPiprazole  ER   clindamycin 300 MG capsule Commonly known as: CLEOCIN   fluconazole  150 MG tablet Commonly known as: Diflucan    ibuprofen  600 MG tablet Commonly known as: ADVIL    INOSITOL PO   LORazepam  1 MG tablet Commonly known as: ATIVAN    meclizine  25 MG tablet Commonly known as: ANTIVERT    methocarbamol  500 MG tablet Commonly known as: ROBAXIN    naproxen  500 MG tablet Commonly known as: NAPROSYN    ondansetron  4 MG disintegrating tablet Commonly known as: ZOFRAN -ODT   traZODone  150 MG tablet Commonly known as: DESYREL        TAKE these medications      Indication  Cobenfy 100-20 MG Caps Generic drug: Xanomeline-Trospium Chloride Take 1 capsule by mouth 2 (two) times daily. What changed: when to take this  Indication: mdd   cyclobenzaprine  10 MG tablet Commonly known as: FLEXERIL  Take 1 tablet (10 mg total) by mouth 3 (three) times daily as needed for muscle spasms. What changed: when to take this  Indication: Muscle Spasm   escitalopram 10 MG tablet Commonly known as: LEXAPRO Take 1 tablet (10 mg total) by mouth at bedtime.  Indication: Major Depressive Disorder   metFORMIN 500 MG tablet Commonly known as: GLUCOPHAGE Take 500 mg by mouth every evening.  Indication: Type 2 Diabetes   Ventolin  HFA  108 (90 Base) MCG/ACT inhaler Generic drug: albuterol  INHALE TWO PUFFS INTO THE LUNGS EVERY 4 HOURS AS NEEDED FOR WHEEZING AND SHORTNESS OF BREATH  Indication: Asthma        Follow-up Information     [facility] Follow up.   Why: Please continue with this provider for ACTT services. Contact information: [address] [place] [address] [phone]         [facility] Follow up on 07/30/2024.  Specialty: Behavioral Health Why: Please call this provider on 07/30/24 at 9:00 am, to schedule an assessment for the intensive outpatient therapy program. You may call directly to [phone]. Contact information: [address] [place]  [address] [phone]        [facility] Follow up.   Why: You may call this provider to schedule an appointment for CBT or DBT therapy services. Contact information: [address] [place] [address] [phone]                Follow-up recommendations:    Activity: as tolerated  Diet: heart healthy  Other: -Follow-up with your outpatient psychiatric provider -instructions on appointment date, time, and address (location) are provided to you in discharge paperwork.  -Take your psychiatric medications as prescribed at discharge - instructions are provided to you in the discharge paperwork  -Recommend abstinence from alcohol, tobacco, and other illicit drug use at discharge.   -If your psychiatric symptoms recur, worsen, or if you have side effects to your psychiatric medications, call your outpatient psychiatric provider, 911, [phone] or go to the nearest emergency department.  -If suicidal thoughts recur, call your outpatient psychiatric provider, 911, [phone] or go to the nearest emergency department.   SignedBETHA Alfornia Light, DO 07/28/2024, 1:40 PM

## 2024-07-30 ENCOUNTER — Telehealth: Payer: Self-pay

## 2024-07-30 NOTE — Telephone Encounter (Signed)
 Pt is scheduled to see BW on Thursday, 08-02-24 for a f/u on her sleep study. The sleep study is not scheduled until 07-31-24. Can we have pt rescheduled for another 2-3 weeks.

## 2024-07-31 ENCOUNTER — Ambulatory Visit (HOSPITAL_BASED_OUTPATIENT_CLINIC_OR_DEPARTMENT_OTHER): Attending: Pulmonary Disease | Admitting: Pulmonary Disease

## 2024-07-31 DIAGNOSIS — R0683 Snoring: Secondary | ICD-10-CM | POA: Insufficient documentation

## 2024-07-31 DIAGNOSIS — E662 Morbid (severe) obesity with alveolar hypoventilation: Secondary | ICD-10-CM | POA: Diagnosis not present

## 2024-07-31 DIAGNOSIS — Z6841 Body Mass Index (BMI) 40.0 and over, adult: Secondary | ICD-10-CM | POA: Insufficient documentation

## 2024-08-02 ENCOUNTER — Ambulatory Visit: Admitting: Primary Care

## 2024-08-07 ENCOUNTER — Telehealth: Payer: Self-pay | Admitting: Pulmonary Disease

## 2024-08-07 DIAGNOSIS — R0683 Snoring: Secondary | ICD-10-CM | POA: Diagnosis not present

## 2024-08-07 NOTE — Procedures (Signed)
 Darryle Law [facility] [address] [place], [place] [address] Tel: [phone]   Fax: [phone]  Polysomnography Interpretation  Patient Name:  Amanda [name], Amanda [name] Date:  07/31/2024 Referring Physician:  JENNET EPLEY [phone]) %%startinterp%% Indications for Polysomnography The patient is a 23 year old Female who is 5' 4 and weighs 250.0 lbs. Her BMI equals 43.2.  A full night polysomnogram was performed to evaluate for -.  Medications taken at 2013.  COBENFY  LEXAPRO taken at 2100.  METFORMIN   Polysomnogram Data A full night polysomnogram recorded the standard physiologic parameters including EEG, EOG, EMG, EKG, nasal and oral airflow.  Respiratory parameters of chest and abdominal movements were recorded with Respiratory Inductance Plethysmography belts.  Oxygen saturation was recorded by pulse oximetry.   Sleep Architecture The total recording time of the polysomnogram was 454.2 minutes.  The total sleep time was 383.0 minutes.  The patient spent 7.4% of total sleep time in Stage N1, 51.6% in Stage N2, 21.4% in Stages N3, and 19.6% in REM.  Sleep latency was 12.8 minutes.  REM latency was 234.0 minutes.  Sleep Efficiency was 84.3%.  Wake after Sleep Onset time was 58.0 minutes.  Respiratory Events The polysomnogram revealed a presence of - obstructive, - central, and - mixed apneas resulting in an Apnea index of - events per hour.  There were 20 hypopneas (>=3% desaturation and/or arousal) resulting in an Apnea\Hypopnea Index (AHI >=3% desaturation and/or arousal) of 3.1 events per hour.  There were 5 hypopneas (>=4% desaturation) resulting in an Apnea\Hypopnea Index (AHI >=4% desaturation) of 0.8 events per hour.  There were 39 Respiratory Effort Related Arousals resulting in a RERA index of 6.1 events per hour. The Respiratory Disturbance Index is 9.2 events per hour.  The snore index was 299.5 events per hour.  Mean oxygen saturation was 94.6%.   The lowest oxygen saturation during sleep was 92.0%.  Time spent <=88% oxygen saturation was - minutes (-).  Limb Activity There were - total limb movements recorded, of this total, - were classified as PLMs.  PLM index was - per hour and PLM associated with Arousals index was - per hour.  Cardiac Summary The average pulse rate was 66.4 bpm.  The minimum pulse rate was 52.0 bpm while the maximum pulse rate was 110.0 bpm.  Cardiac rhythm was normal/abnormal.  Comments:  Patient scheduled for split-night study, did not have significant events to meet threshold for a split-night study  Diagnosis:  Negative study for significant sleep disordered breathing. AHI of 3.1 with oxygen nadir of 92% Good sleep efficiency, good sleep architecture.  Delayed REM onset. No significant periodic limb movement Cardiac rhythm was sinus Moderate snoring noted  Recommendations: Clinical follow-up of symptoms Encourage weight loss measures Behavioral modifications like ensuring lateral sleep, elevation of the head of the bed may help snoring. May consider oral device for the management of snoring.  This study was personally reviewed and electronically signed by: Dr. Jennet Epley Accredited Board Certified in Sleep Medicine Date/Time:   08/07/24  %%endinterp%%   Diagnostic PSG Report  Patient Name: Amanda [name] Date: 07/31/2024  Date of Birth: [birth year] Study Type: Split Night  Age: 75 year MRN #: [id number]  Sex: Female Interpreting Physician: EPLEY JENNET, [id number]  Height: 5' 4 Referring Physician: JENNET EPLEY [phone])  Weight: 250.0 lbs Recording Tech: Dewane Hacker CRT RPSGT RST  BMI: 43.2 Scoring Tech: Dewane Hacker CRT RPSGT RST  ESS: 5 Neck Size: 15.25   Study Overview  Lights Off: 09:42:45 PM  Count Index  Lights On: 05:16:59 AM Awakenings: 19 3.0  Time in Bed: 454.2 min. Arousals: 129 20.2  Total Sleep Time: 383.0 min. AHI (>=3% Desat and/or Ar.): 20 3.1    Sleep Efficiency: 84.3% AHI (>=4% Desat): 5 0.8   Sleep Latency: 12.8 min. Limb Movements: - -  Wake After Sleep Onset: 58.0 min. Snore: 1912 299.5  REM Latency from Sleep Onset: 234.0 min. Desaturations: 27 4.2     Minimum SpO2 TST: 92.0%    Sleep Architecture  % of Time in Bed Stages Time (mins) % Sleep Time  Wake 71.5   Stage N1 28.5 7.4%  Stage N2 197.5 51.6%  Stage N3 82.0 21.4%  REM 75.0 19.6%   Arousal Summary   NREM REM Sleep Index  Respiratory Arousals 36 8 44 6.9  PLM Arousals - - - -  Isolated Limb Movement Arousals - - - -  Snore Arousals 29 2 31  4.9  Spontaneous Arousals 46 8 54 8.5  Total 111 18 129 20.2   Limb Movement Summary   Count Index  Isolated Limb Movements - -  Periodic Limb Movements (PLMs) - -  Total Limb Movements - -    Respiratory Summary   By Sleep Stage By Body Position Total   NREM REM Supine Non-Supine   Time (min) 308.0 75.0 147.5 235.5 383.0         Obstructive Apnea - - - - -  Mixed Apnea - - - - -  Central Apnea - - - - -  Total Apneas - - - - -  Total Apnea Index - - - - -         Hypopneas (>=3% Desat and/or Ar.) 11 9 16 4 20   AHI (>=3% Desat and/or Ar.) 2.1 7.2 6.5 1.0 3.1         Hypopneas (>=4% Desat) 4 1 5  - 5  AHI (>=4% Desat) 0.8 0.8 2.0 - 0.8          RERAs 33 6 11 28  39  RERA Index 6.4 4.8 4.5 7.1 6.1         RDI 8.6 12.0 11.0 8.2 9.2    Respiratory Event Type Index  Central Apneas -  Obstructive Apneas -  Mixed Apneas -  Central Hypopneas -  Obstructive Hypopneas 3.1  Central Apnea + Hypopnea (CAHI) -  Obstructive Apnea + Hypopnea (OAHI) 3.1   Respiratory Event Durations   Apnea Hypopnea   NREM REM NREM REM  Average (seconds) - - 22.3 26.1  Maximum (seconds) - - 31.3 32.4    Oxygen Saturation Summary   Wake NREM REM TST TIB  Average SpO2 (%) 95.8% 94.3% 94.9% 94.4% 94.6%  Minimum SpO2 (%) 92.0% 92.0% 93.0% 92.0% 92.0%  Maximum SpO2 (%) 100.0% 98.0% 98.0% 98.0% 100.0%   Oxygen Saturation  Distribution  Range (%) Time in range (min) Time in range (%)  90.0 - 100.0 449.1 100.0%  80.0 - 90.0 - -  70.0 - 80.0 - -  60.0 - 70.0 - -  50.0 - 60.0 - -  0.0 - 50.0 - -  Time Spent <=88% SpO2  Range (%) Time in range (min) Time in range (%)  0.0 - 88.0 - -      Count Index  Desaturations 27 4.2    Cardiac Summary   Wake NREM REM Sleep Total  Average Pulse Rate (BPM) 70.3 65.2 67.8 65.7 66.4  Minimum Pulse Rate (BPM) 54.0  52.0 54.0 52.0 52.0  Maximum Pulse Rate (BPM) 110.0 84.0 79.0 84.0 110.0   Pulse Rate Distribution:  Range (bpm) Time in range (min) Time in range (%)  0.0 - 40.0 - -  40.0 - 60.0 49.5 11.0%  60.0 - 80.0 385.6 85.8%  80.0 - 100.0 8.8 2.0%  100.0 - 120.0 1.6 0.4%  120.0 - 140.0 - -  140.0 - 200.0 - -      Hypnograms                      Technologist Comments  THE 27-YEAR-OLD FEMALE PATIENT PRESENTED TO THE SLEEP DISORDER CENTER FOR A SPLIT NIGHT STUDY WITH A CHIEF COMPLAINT OF SNORING. ALL BEDTIME MEDICATIONS WERE SELF ADMINISTERED AT 2013 AND 2100. THE SPLIT NIGHT STUDY WAS EXPLAINED WITH ALL QUESTIONS ANSWERED. THE PATIENT WAS FITTED WITH A SMALL RESMED AIRTOUCH F20 FFM FOR PRE-CPAP TRIAL, WHICH WAS TOLERATED WELL. THE LEAD PLACEMENT WAS INITIATED, THEN THE STUDY WAS BEGUN. SUPPLEMENTAL OXYGEN WAS NOT WARRANTED DURING THE STUDY. MODERATE TO LOUD AUDIBLE SNORING WAS NOTED THROUGHOUT THE STUDY. NO PLMs - PLMAs WERE NOTED. NO OBVIOUS CARDIAC ARRHYTHMIAS WERE OBSERVED. ONE RESTROOM VISIT WAS MADE. NO OBVIOUS PARASOMNIAS WERE OBSERVED. SPLIT NIGHT CRITERIA WAS NOT MET DUE TO INSUFFICIENT AHI, THEREFORE A DIAGNOSITC STUDY WAS CONTINUED.  THE PATIENT TOLERATED THE NPSG DIAGNOSTIC STUDY WELL.

## 2024-08-07 NOTE — Telephone Encounter (Signed)
 Call patient  Sleep study result  Date of study: 07/31/2024  Impression: Negative study for significant sleep disordered breathing with an AHI of 3.1, no significant oxygen desaturations Good sleep efficiency  Recommendation:  Clinical follow-up of symptoms  Encourage weight loss measures  Behavioral modifications to help snoring may include elevation of the head of the bed, ensuring side sleeping  An oral device may be considered for the management of snoring  Follow-up as previously scheduled

## 2024-08-07 NOTE — Telephone Encounter (Signed)
 ATC sent mychart message

## 2024-08-08 NOTE — Telephone Encounter (Signed)
 Patient has a follow up on 12/23. Called and spoke with patient, provided results/recommendations per Dr. Neda.  She verbalized understanding.  Nothing further needed.

## 2024-08-11 NOTE — Procedures (Signed)
  Indications for Polysomnography The patient is a 23 year old Female who is 5' 4 and weighs 250.0 lbs. Her BMI equals 43.2.  A full night polysomnogram was performed to evaluate for -.  Medications taken at 2013.CO[facility]EN[facility]LEX[facility]RO taken at 2100.METFORMIN  Polysomnogram Data A full night polysomnogram recorded the standard physiologic parameters including EEG, EOG, EMG, EKG, nasal and oral airflow.  Respiratory parameters of chest and abdominal movements were recorded with Respiratory Inductance Plethysmography belts.   Oxygen saturation was recorded by pulse oximetry.  Sleep Architecture The total recording time of the polysomnogram was 454.2 minutes.  The total sleep time was 383.0 minutes.  The patient spent 7.4% of total sleep time in Stage N1, 51.6% in Stage N2, 21.4% in Stages N3, and 19.6% in REM.  Sleep latency was 12.8 minutes.   REM latency was 234.0 minutes.  Sleep Efficiency was 84.3%.  Wake after Sleep Onset time was 58.0 minutes.  Respiratory Events The polysomnogram revealed a presence of - obstructive, - central, and - mixed apneas resulting in an Apnea index of - events per hour.  There were 20 hypopneas (GreaterEqual to3% desaturation and/or arousal) resulting in an Apnea\Hypopnea Index (AHI  GreaterEqual to3% desaturation and/or arousal) of 3.1 events per hour.  There were 5 hypopneas (GreaterEqual to4% desaturation) resulting in an Apnea\Hypopnea Index (AHI GreaterEqual to4% desaturation) of 0.8 events per hour.  There were 39 Respiratory  Effort Related Arousals resulting in a RERA index of 6.1 events per hour. The Respiratory Disturbance Index is 9.2 events per hour.  The snore index was 299.5 events per hour.  Mean oxygen saturation was 94.6%.  The lowest oxygen saturation during sleep was 92.0%.  Time spent LessEqual to88% oxygen saturation was  minutes ().  Limb Activity There were - total limb movements recorded, of this total, - were classified as PLMs.  PLM index was - per  hour and PLM associated with Arousals index was - per hour.  Cardiac Summary The average pulse rate was 66.4 bpm.  The minimum pulse rate was 52.0 bpm while the maximum pulse rate was 110.0 bpm.  Cardiac rhythm was normal/abnormal.  Comments: Patient scheduled for split-night study, did not have significant events to meet threshold for a split-night study   Diagnosis: Negative study for significant sleep disordered breathing. AHI of 3.1 with oxygen nadir of 92% Good sleep efficiency, good sleep architecture.  Delayed REM onset. No significant periodic limb movement Cardiac rhythm was sinus Moderate snoring noted  Recommendations: Clinical follow-up of symptoms Encourage weight loss measures Behavioral modifications like ensuring lateral sleep, elevation of the head of the bed may help snoring. May consider oral device for the management of snoring.     This study was personally reviewed and electronically signed by: Dr. Jennet Epley Accredited Board Certified in Sleep Medicine Date/Time:   08/07/24

## 2024-09-11 ENCOUNTER — Ambulatory Visit: Admitting: Primary Care

## 2024-10-21 ENCOUNTER — Emergency Department

## 2024-10-21 ENCOUNTER — Other Ambulatory Visit: Payer: Self-pay

## 2024-10-21 ENCOUNTER — Emergency Department
Admission: EM | Admit: 2024-10-21 | Discharge: 2024-10-21 | Disposition: A | Discharge status: Home / Self Care | Attending: Emergency Medicine | Admitting: Emergency Medicine

## 2024-10-21 DIAGNOSIS — R0789 Other chest pain: Secondary | ICD-10-CM | POA: Diagnosis not present

## 2024-10-21 DIAGNOSIS — R42 Dizziness and giddiness: Secondary | ICD-10-CM | POA: Insufficient documentation

## 2024-10-21 DIAGNOSIS — F32A Depression, unspecified: Secondary | ICD-10-CM | POA: Diagnosis not present

## 2024-10-21 LAB — COMPREHENSIVE METABOLIC PANEL WITH GFR
ALT: 16 U/L (ref 0–44)
AST: 25 U/L (ref 15–41)
Albumin: 4.4 g/dL (ref 3.5–5.0)
Alkaline Phosphatase: 49 U/L (ref 38–126)
Anion gap: 11 (ref 5–15)
BUN: 9 mg/dL (ref 6–20)
CO2: 23 mmol/L (ref 22–32)
Calcium: 9.8 mg/dL (ref 8.9–10.3)
Chloride: 103 mmol/L (ref 98–111)
Creatinine, Ser: 0.77 mg/dL (ref 0.44–1.00)
GFR, Estimated: >60 mL/min
Glucose, Bld: 108 mg/dL — ABNORMAL HIGH (ref 70–99)
Potassium: 3.9 mmol/L (ref 3.5–5.1)
Sodium: 136 mmol/L (ref 135–145)
Total Bilirubin: 0.8 mg/dL (ref 0.0–1.2)
Total Protein: 7.6 g/dL (ref 6.5–8.1)

## 2024-10-21 LAB — URINALYSIS, ROUTINE W REFLEX MICROSCOPIC
Bacteria, UA: NONE SEEN
Bilirubin Urine: NEGATIVE
Glucose, UA: NEGATIVE mg/dL
Ketones, ur: 20 mg/dL — AB
Nitrite: NEGATIVE
Protein, ur: NEGATIVE mg/dL
Specific Gravity, Urine: 1.01 (ref 1.005–1.030)
pH: 6 (ref 5.0–8.0)

## 2024-10-21 LAB — CBC
HCT: 38.6 % (ref 36.0–46.0)
Hemoglobin: 13.1 g/dL (ref 12.0–15.0)
MCH: 30.1 pg (ref 26.0–34.0)
MCHC: 33.9 g/dL (ref 30.0–36.0)
MCV: 88.7 fL (ref 80.0–100.0)
Platelets: 298 10*3/uL (ref 150–400)
RBC: 4.35 MIL/uL (ref 3.87–5.11)
RDW: 12.4 % (ref 11.5–15.5)
WBC: 9.8 10*3/uL (ref 4.0–10.5)
nRBC: 0 % (ref 0.0–0.2)

## 2024-10-21 LAB — TROPONIN T, HIGH SENSITIVITY: Troponin T High Sensitivity: <6 ng/L (ref 0–19)

## 2024-10-21 LAB — POC URINE PREG, ED: Preg Test, Ur: NEGATIVE

## 2024-10-21 LAB — LIPASE, BLOOD: Lipase: 19 U/L (ref 11–51)

## 2024-10-21 MED ORDER — SODIUM CHLORIDE 0.9 % IV BOLUS
1000.0000 mL | Freq: Once | INTRAVENOUS | Status: AC
  Administered 2024-10-21: 1000 mL via INTRAVENOUS

## 2024-10-21 MED ORDER — MECLIZINE HCL 25 MG PO TABS
25.0000 mg | ORAL_TABLET | Freq: Once | ORAL | Status: AC
  Administered 2024-10-21: 25 mg via ORAL
  Filled 2024-10-21: qty 1
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
## 2024-10-21 NOTE — ED Notes (Signed)
 Pt given a sandwich bag, ginger ale, and ice.
